# Patient Record
Sex: Male | Born: 1970
Health system: Southern US, Community
[De-identification: ages and names within clinical notes are randomized; demographics above are authoritative.]

## PROBLEM LIST (undated history)

## (undated) DIAGNOSIS — R110 Nausea: Secondary | ICD-10-CM

## (undated) DIAGNOSIS — B20 Human immunodeficiency virus [HIV] disease: Secondary | ICD-10-CM

## (undated) DIAGNOSIS — F329 Major depressive disorder, single episode, unspecified: Secondary | ICD-10-CM

## (undated) DIAGNOSIS — E109 Type 1 diabetes mellitus without complications: Secondary | ICD-10-CM

## (undated) DIAGNOSIS — I10 Essential (primary) hypertension: Secondary | ICD-10-CM

## (undated) HISTORY — PX: CHOLECYSTECTOMY: SHX55

---

## 2007-05-11 HISTORY — PX: OTHER SURGICAL HISTORY: SHX169

## 2017-08-02 ENCOUNTER — Telehealth: Payer: Self-pay

## 2017-08-02 ENCOUNTER — Other Ambulatory Visit: Payer: Self-pay

## 2017-08-02 ENCOUNTER — Ambulatory Visit: Payer: Self-pay

## 2017-08-02 DIAGNOSIS — B20 Human immunodeficiency virus [HIV] disease: Secondary | ICD-10-CM | POA: Insufficient documentation

## 2017-08-02 NOTE — Telephone Encounter (Signed)
[facility] [place]. He will sign medical release today and see financial counselor.   Diagnosis verified via electronic medical records via patient's phone.   Last Cd-4 736 in Jan 2019.    If he qualifies for [employer] we will complete labs today.    Laurell Josephsammy K Elleigh Cassetta, RN

## 2017-08-03 ENCOUNTER — Encounter: Payer: Self-pay | Admitting: Infectious Diseases

## 2017-08-03 LAB — URINALYSIS
Bilirubin Urine: NEGATIVE
Hgb urine dipstick: NEGATIVE
Ketones, ur: NEGATIVE
LEUKOCYTES UA: NEGATIVE
NITRITE: NEGATIVE
PROTEIN: NEGATIVE
SPECIFIC GRAVITY, URINE: 1.041 — AB (ref 1.001–1.03)
pH: 5.5 (ref 5.0–8.0)

## 2017-08-03 LAB — URINE CYTOLOGY ANCILLARY ONLY
Chlamydia: NEGATIVE
Neisseria Gonorrhea: NEGATIVE

## 2017-08-04 LAB — HIV-1 RNA ULTRAQUANT REFLEX TO GENTYP+
HIV 1 RNA Quant: 35 Copies/mL — ABNORMAL HIGH
HIV-1 RNA QUANT, LOG: 1.54 {Log_copies}/mL — AB

## 2017-08-04 LAB — QUANTIFERON-TB GOLD PLUS
Mitogen-NIL: >10 IU/mL
NIL: 0.27 [IU]/mL
QUANTIFERON-TB GOLD PLUS: NEGATIVE
TB1-NIL: <0 IU/mL

## 2017-08-04 LAB — T-HELPER CELL (CD4) - (RCID CLINIC ONLY)
CD4 T CELL ABS: 1010 /uL (ref 400–2700)
CD4 T CELL HELPER: 28 % — AB (ref 33–55)

## 2017-08-05 LAB — CBC WITH DIFFERENTIAL/PLATELET
BASOS PCT: 0.4 %
Basophils Absolute: 28 cells/uL (ref 0–200)
Eosinophils Absolute: 50 cells/uL (ref 15–500)
Eosinophils Relative: 0.7 %
HEMATOCRIT: 51 % — AB (ref 38.5–50.0)
Hemoglobin: 17 g/dL (ref 13.2–17.1)
LYMPHS ABS: 3507 {cells}/uL (ref 850–3900)
MCH: 30.5 pg (ref 27.0–33.0)
MCHC: 33.3 g/dL (ref 32.0–36.0)
MCV: 91.4 fL (ref 80.0–100.0)
MPV: 9.1 fL (ref 7.5–12.5)
Monocytes Relative: 8.3 %
Neutro Abs: 2925 cells/uL (ref 1500–7800)
Neutrophils Relative %: 41.2 %
Platelets: 244 10*3/uL (ref 140–400)
RBC: 5.58 10*6/uL (ref 4.20–5.80)
RDW: 14.3 % (ref 11.0–15.0)
Total Lymphocyte: 49.4 %
WBC: 7.1 10*3/uL (ref 3.8–10.8)
WBCMIX: 589 {cells}/uL (ref 200–950)

## 2017-08-05 LAB — COMPLETE METABOLIC PANEL WITH GFR
AG RATIO: 1.4 (calc) (ref 1.0–2.5)
ALBUMIN MSPROF: 4.8 g/dL (ref 3.6–5.1)
ALT: 73 U/L — ABNORMAL HIGH (ref 9–46)
AST: 52 U/L — ABNORMAL HIGH (ref 10–40)
Alkaline phosphatase (APISO): 98 U/L (ref 40–115)
BUN: 14 mg/dL (ref 7–25)
CALCIUM: 10.2 mg/dL (ref 8.6–10.3)
CO2: 24 mmol/L (ref 20–32)
Chloride: 100 mmol/L (ref 98–110)
Creat: 0.92 mg/dL (ref 0.60–1.35)
GFR, EST AFRICAN AMERICAN: 113 mL/min/{1.73_m2} (ref >=60)
GFR, EST NON AFRICAN AMERICAN: 97 mL/min/{1.73_m2} (ref >=60)
GLOBULIN: 3.5 g/dL (ref 1.9–3.7)
Glucose, Bld: 346 mg/dL — ABNORMAL HIGH (ref 65–99)
POTASSIUM: 4.7 mmol/L (ref 3.5–5.3)
SODIUM: 136 mmol/L (ref 135–146)
TOTAL PROTEIN: 8.3 g/dL — AB (ref 6.1–8.1)
Total Bilirubin: 0.6 mg/dL (ref 0.2–1.2)

## 2017-08-05 LAB — HEPATITIS B SURFACE ANTIGEN: Hepatitis B Surface Ag: NONREACTIVE

## 2017-08-05 LAB — LIPID PANEL
CHOLESTEROL: 111 mg/dL (ref <200)
HDL: 47 mg/dL (ref >40)
LDL CHOLESTEROL (CALC): 44 mg/dL
Non-HDL Cholesterol (Calc): 64 mg/dL (calc) (ref <130)
TRIGLYCERIDES: 118 mg/dL (ref <150)
Total CHOL/HDL Ratio: 2.4 (calc) (ref <5.0)

## 2017-08-05 LAB — HEPATITIS C ANTIBODY
Hepatitis C Ab: NONREACTIVE
SIGNAL TO CUT-OFF: 0.11 (ref <1.00)

## 2017-08-05 LAB — HLA B*5701: HLA-B 5701 W/RFLX HLA-B HIGH: NEGATIVE

## 2017-08-05 LAB — HEPATITIS A ANTIBODY, TOTAL: HEPATITIS A AB,TOTAL: REACTIVE — AB

## 2017-08-05 LAB — HEPATITIS B SURFACE ANTIBODY,QUALITATIVE: Hep B S Ab: REACTIVE — AB

## 2017-08-05 LAB — RPR: RPR: NONREACTIVE

## 2017-08-05 LAB — HEPATITIS B CORE ANTIBODY, TOTAL: Hep B Core Total Ab: REACTIVE — AB

## 2017-08-11 ENCOUNTER — Ambulatory Visit: Payer: Self-pay | Admitting: Infectious Diseases

## 2017-08-18 ENCOUNTER — Ambulatory Visit: Payer: Self-pay | Admitting: Infectious Diseases

## 2017-08-30 DIAGNOSIS — N529 Male erectile dysfunction, unspecified: Secondary | ICD-10-CM | POA: Insufficient documentation

## 2017-08-30 DIAGNOSIS — F32A Depression, unspecified: Secondary | ICD-10-CM | POA: Insufficient documentation

## 2017-08-30 DIAGNOSIS — R0609 Other forms of dyspnea: Secondary | ICD-10-CM | POA: Insufficient documentation

## 2017-09-27 DIAGNOSIS — R748 Abnormal levels of other serum enzymes: Secondary | ICD-10-CM | POA: Insufficient documentation

## 2018-02-27 DIAGNOSIS — R110 Nausea: Secondary | ICD-10-CM | POA: Insufficient documentation

## 2018-04-06 ENCOUNTER — Emergency Department (HOSPITAL_COMMUNITY): Payer: Medicaid - Out of State

## 2018-04-06 ENCOUNTER — Other Ambulatory Visit: Payer: Self-pay

## 2018-04-06 ENCOUNTER — Emergency Department (HOSPITAL_COMMUNITY)
Admission: EM | Admit: 2018-04-06 | Discharge: 2018-04-06 | Disposition: A | Discharge status: Home / Self Care | Payer: Medicaid - Out of State | Attending: Emergency Medicine | Admitting: Emergency Medicine

## 2018-04-06 ENCOUNTER — Encounter (HOSPITAL_COMMUNITY): Payer: Self-pay

## 2018-04-06 DIAGNOSIS — B20 Human immunodeficiency virus [HIV] disease: Secondary | ICD-10-CM | POA: Diagnosis not present

## 2018-04-06 DIAGNOSIS — Z21 Asymptomatic human immunodeficiency virus [HIV] infection status: Secondary | ICD-10-CM

## 2018-04-06 DIAGNOSIS — K6289 Other specified diseases of anus and rectum: Secondary | ICD-10-CM | POA: Insufficient documentation

## 2018-04-06 DIAGNOSIS — R110 Nausea: Secondary | ICD-10-CM

## 2018-04-06 DIAGNOSIS — F32A Depression, unspecified: Secondary | ICD-10-CM

## 2018-04-06 DIAGNOSIS — E109 Type 1 diabetes mellitus without complications: Secondary | ICD-10-CM

## 2018-04-06 HISTORY — DX: Nausea: R11.0

## 2018-04-06 HISTORY — DX: Major depressive disorder, single episode, unspecified: F32.9

## 2018-04-06 HISTORY — DX: Asymptomatic human immunodeficiency virus (hiv) infection status: Z21

## 2018-04-06 HISTORY — DX: Depression, unspecified: F32.A

## 2018-04-06 HISTORY — DX: Type 1 diabetes mellitus without complications: E10.9

## 2018-04-06 HISTORY — DX: Essential (primary) hypertension: I10

## 2018-04-06 HISTORY — DX: Human immunodeficiency virus (HIV) disease: B20

## 2018-04-06 LAB — COMPREHENSIVE METABOLIC PANEL
ALT: 67 U/L — ABNORMAL HIGH (ref 0–44)
AST: 64 U/L — ABNORMAL HIGH (ref 15–41)
Albumin: 4.1 g/dL (ref 3.5–5.0)
Alkaline Phosphatase: 91 U/L (ref 38–126)
Anion gap: 7 (ref 5–15)
BUN: 14 mg/dL (ref 6–20)
CO2: 26 mmol/L (ref 22–32)
Calcium: 9 mg/dL (ref 8.9–10.3)
Chloride: 101 mmol/L (ref 98–111)
Creatinine, Ser: 0.81 mg/dL (ref 0.61–1.24)
GFR calc Af Amer: >60 mL/min (ref >60)
GFR calc non Af Amer: >60 mL/min (ref >60)
Glucose, Bld: 323 mg/dL — ABNORMAL HIGH (ref 70–99)
Potassium: 4.2 mmol/L (ref 3.5–5.1)
Sodium: 134 mmol/L — ABNORMAL LOW (ref 135–145)
Total Bilirubin: 1.2 mg/dL (ref 0.3–1.2)
Total Protein: 7.9 g/dL (ref 6.5–8.1)

## 2018-04-06 LAB — URINALYSIS, ROUTINE W REFLEX MICROSCOPIC
Bacteria, UA: NONE SEEN
Bilirubin Urine: NEGATIVE
Glucose, UA: >=500 mg/dL — AB
Hgb urine dipstick: NEGATIVE
Ketones, ur: 5 mg/dL — AB
Leukocytes, UA: NEGATIVE
NITRITE: NEGATIVE
Protein, ur: NEGATIVE mg/dL
Specific Gravity, Urine: 1.041 — ABNORMAL HIGH (ref 1.005–1.030)
pH: 6 (ref 5.0–8.0)

## 2018-04-06 LAB — CBC
HCT: 46.7 % (ref 39.0–52.0)
Hemoglobin: 15.5 g/dL (ref 13.0–17.0)
MCH: 33.3 pg (ref 26.0–34.0)
MCHC: 33.2 g/dL (ref 30.0–36.0)
MCV: 100.2 fL — ABNORMAL HIGH (ref 80.0–100.0)
Platelets: 191 10*3/uL (ref 150–400)
RBC: 4.66 MIL/uL (ref 4.22–5.81)
RDW: 13.4 % (ref 11.5–15.5)
WBC: 5.6 10*3/uL (ref 4.0–10.5)
nRBC: 0 % (ref 0.0–0.2)

## 2018-04-06 LAB — POC OCCULT BLOOD, ED: Fecal Occult Bld: NEGATIVE

## 2018-04-06 LAB — LIPASE, BLOOD: Lipase: 33 U/L (ref 11–51)

## 2018-04-06 MED ORDER — SODIUM CHLORIDE (PF) 0.9 % IJ SOLN
INTRAMUSCULAR | Status: AC
  Filled 2018-04-06: qty 50

## 2018-04-06 MED ORDER — SODIUM CHLORIDE 0.9 % IV SOLN
INTRAVENOUS | Status: DC
  Administered 2018-04-06: 15:00:00 via INTRAVENOUS

## 2018-04-06 MED ORDER — IOPAMIDOL (ISOVUE-300) INJECTION 61%
INTRAVENOUS | Status: AC
  Filled 2018-04-06: qty 100

## 2018-04-06 MED ORDER — METRONIDAZOLE 500 MG PO TABS
500.0000 mg | ORAL_TABLET | Freq: Once | ORAL | Status: AC
  Administered 2018-04-06: 500 mg via ORAL
  Filled 2018-04-06: qty 1

## 2018-04-06 MED ORDER — IOHEXOL 300 MG/ML  SOLN: 30.0000 mL | Freq: Once | INTRAMUSCULAR | Status: DC | PRN

## 2018-04-06 MED ORDER — CIPROFLOXACIN HCL 500 MG PO TABS
500.0000 mg | ORAL_TABLET | Freq: Once | ORAL | Status: AC
  Administered 2018-04-06: 500 mg via ORAL
  Filled 2018-04-06: qty 1

## 2018-04-06 MED ORDER — FENTANYL CITRATE (PF) 100 MCG/2ML IJ SOLN
50.0000 ug | Freq: Once | INTRAMUSCULAR | Status: AC
  Administered 2018-04-06: 50 ug via INTRAVENOUS
  Filled 2018-04-06: qty 2

## 2018-04-06 MED ORDER — SODIUM CHLORIDE 0.9 % IV BOLUS
500.0000 mL | Freq: Once | INTRAVENOUS | Status: AC
  Administered 2018-04-06: 500 mL via INTRAVENOUS

## 2018-04-06 MED ORDER — ONDANSETRON HCL 4 MG/2ML IJ SOLN
4.0000 mg | Freq: Once | INTRAMUSCULAR | Status: AC
  Administered 2018-04-06: 4 mg via INTRAVENOUS
  Filled 2018-04-06: qty 2

## 2018-04-06 MED ORDER — IOHEXOL 300 MG/ML  SOLN
100.0000 mL | Freq: Once | INTRAMUSCULAR | Status: AC | PRN
  Administered 2018-04-06: 100 mL via INTRAVENOUS

## 2018-04-06 MED ORDER — METRONIDAZOLE 500 MG PO TABS: 500.0000 mg | ORAL_TABLET | Freq: Two times a day (BID) | ORAL | 0 refills | Status: DC

## 2018-04-06 MED ORDER — AZITHROMYCIN 1 G PO PACK
1.0000 g | PACK | Freq: Once | ORAL | Status: AC
  Administered 2018-04-06: 1 g via ORAL
  Filled 2018-04-06: qty 1

## 2018-04-06 MED ORDER — CIPROFLOXACIN HCL 500 MG PO TABS: 500.0000 mg | ORAL_TABLET | Freq: Two times a day (BID) | ORAL | 0 refills | Status: DC

## 2018-04-06 NOTE — Discharge Instructions (Addendum)
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  Avoid having sex until you get checked again.  Take the medicines as directed.  Proctitis can be aggravated by constipation so make sure that you are using a stool softener to improve your stooling.  I recommend that use MiraLAX 2 or 3 times a day until you are having a daily soft stool then gradually decrease the frequency of use.  He will likely need to be on MiraLAX for a couple of months.  When you get home schedule a follow-up appointment with an ID doctor or gastroenterologist, for anosscopy to check for ongoing symptoms of proctitis.  See the doctor of your choice if not better in 2 or 3 days.

## 2018-04-06 NOTE — ED Notes (Signed)
Pt verbalized discharge instructions and follow up care. Ambulatory and O2 sats were 97% when discharged

## 2018-04-06 NOTE — ED Provider Notes (Signed)
[facility]-EMERGENCY DEPT Provider Note   CSN: [id number] Arrival date & time: 04/06/18  1320     History   Chief Complaint Chief Complaint  Patient presents with  . Abdominal Pain  . Rectal Bleeding    HPI Adam Chan is a 47 y.o. male.  HPI   He presents for evaluation of this patient for 1 week, improved after taking a laxative, followed by some rectal bleeding.  He also complains of a discharge from his rectum.  He denies nausea, vomiting, abdominal pain, dizziness or weakness.  He has sex with men, receptive intercourse.  No prior similar problem.  There are no other known modifying factors.  Past Medical History:  Diagnosis Date  . Depression 04/06/2018  . Diabetes type 1, controlled (HCC) 04/06/2018  . HIV (human immunodeficiency virus infection) (HCC) 04/06/2018  . Hypertension   . Nausea in adult 04/06/2018    Patient Active Problem List   Diagnosis Date Noted  . HIV disease (HCC) 08/02/2017    Past Surgical History:  Procedure Laterality Date  . Bilaterla mesh for Hernia Bilateral 2009  . CHOLECYSTECTOMY          Home Medications    Prior to Admission medications   Medication Sig Start Date End Date Taking? Authorizing Provider  albuterol (PROVENTIL HFA;VENTOLIN HFA) 108 (90 Base) MCG/ACT inhaler Inhale 2 puffs into the lungs every 6 (six) hours as needed for wheezing or shortness of breath.   Yes [provider]  bictegravir-emtricitabine-tenofovir AF (BIKTARVY) 50-200-25 MG TABS tablet Take 1 tablet by mouth daily.   Yes [provider]  EPINEPHrine 0.3 mg/0.3 mL IJ SOAJ injection Inject 0.3 mg into the muscle once.   Yes [provider]  escitalopram (LEXAPRO) 20 MG tablet Take 20 mg by mouth daily.   Yes [provider]  glipiZIDE (GLUCOTROL) 10 MG tablet Take 10 mg by mouth 2 (two) times daily before a meal.   Yes [provider]  hydrOXYzine (ATARAX/VISTARIL) 50 MG tablet  Take 50 mg by mouth daily as needed (sleep).   Yes [provider]  lisinopril (PRINIVIL,ZESTRIL) 10 MG tablet Take 10 mg by mouth daily.   Yes [provider]  metFORMIN (GLUCOPHAGE) 1000 MG tablet Take 1,000 mg by mouth 2 (two) times daily with a meal.   Yes [provider]  omeprazole (PRILOSEC) 20 MG capsule Take 20 mg by mouth daily.   Yes [provider]  ondansetron (ZOFRAN) 8 MG tablet Take 8 mg by mouth every 8 (eight) hours as needed for nausea or vomiting.   Yes [provider]  rosuvastatin (CRESTOR) 40 MG tablet Take 40 mg by mouth daily.   Yes [provider]  sildenafil (REVATIO) 20 MG tablet Take 20 mg by mouth 3 (three) times daily.   Yes [provider]  ciprofloxacin (CIPRO) 500 MG tablet Take 1 tablet (500 mg total) by mouth 2 (two) times daily. One po bid x 7 days 04/06/18   Mancel Bale, MD  metroNIDAZOLE (FLAGYL) 500 MG tablet Take 1 tablet (500 mg total) by mouth 2 (two) times daily. One po bid x 7 days 04/06/18   Mancel Bale, MD    Family History No family history on file.  Social History Social History   Tobacco Use  . Smoking status: Current Some Day Smoker    Packs/day: 0.50    Types: Cigarettes  . Smokeless tobacco: Never Used  Substance Use Topics  . Alcohol use: Yes  Comment: Social drinker, here and there  . Drug use: Yes    Types: Marijuana    Comment: Uses for Nausea per patient stated     Allergies   Sulfa antibiotics   Review of Systems Review of Systems  All other systems reviewed and are negative.    Physical Exam Updated Vital Signs BP (!) 130/96 (BP Location: Left Arm)   Pulse 83   Temp 98.4 F (36.9 C) (Oral)   Resp 18   Ht 5\' 7"  (1.702 m)   Wt 83.5 kg   SpO2 96%   BMI 28.82 kg/m   Physical Exam  Constitutional: He is oriented to person, place, and time. He appears well-developed and well-nourished.  HENT:  Head: Normocephalic and atraumatic.  Right  Ear: External ear normal.  Left Ear: External ear normal.  Eyes: Pupils are equal, round, and reactive to light. Conjunctivae and EOM are normal.  Neck: Normal range of motion and phonation normal. Neck supple.  Cardiovascular: Normal rate, regular rhythm and normal heart sounds.  Pulmonary/Chest: Effort normal and breath sounds normal. He exhibits no bony tenderness.  Abdominal: Soft. There is no tenderness.  Genitourinary:  Genitourinary Comments: Normal anus.  No fecal impaction.  No stool in the rectum.  No blood in rectum.  Unable to palpate prostate.  No rectal mass.  Moderate tenderness, more anterior on rectal examination.  Musculoskeletal: Normal range of motion.  Neurological: He is alert and oriented to person, place, and time. No cranial nerve deficit or sensory deficit. He exhibits normal muscle tone. Coordination normal.  Skin: Skin is warm, dry and intact.  Psychiatric: He has a normal mood and affect. His behavior is normal. Judgment and thought content normal.  Nursing note and vitals reviewed.    ED Treatments / Results  Labs (all labs ordered are listed, but only abnormal results are displayed) Labs Reviewed  COMPREHENSIVE METABOLIC PANEL - Abnormal; Notable for the following components:      Result Value   Sodium 134 (*)    Glucose, Bld 323 (*)    AST 64 (*)    ALT 67 (*)    All other components within normal limits  CBC - Abnormal; Notable for the following components:   MCV 100.2 (*)    All other components within normal limits  LIPASE, BLOOD  URINALYSIS, ROUTINE W REFLEX MICROSCOPIC  OCCULT BLOOD X 1 CARD TO LAB, STOOL  POC OCCULT BLOOD, ED    EKG None  Radiology Ct Abdomen Pelvis W Contrast  Result Date: 04/06/2018 CLINICAL DATA:  Rectal bleeding and abdominal pain, no bowel movement for 5 days, history diabetes mellitus type I, hypertension, HIV EXAM: CT ABDOMEN AND PELVIS WITH CONTRAST TECHNIQUE: Multidetector CT imaging of the abdomen and pelvis  was performed using the standard protocol following bolus administration of intravenous contrast. Sagittal and coronal MPR images reconstructed from axial data set. CONTRAST:  OMNIPAQUE IOHEXOL 300 MG/ML SOLN IV. No oral contrast. COMPARISON:  None FINDINGS: Lower chest: Lung bases clear Hepatobiliary: Gallbladder surgically absent. Fatty infiltration of liver. No focal hepatic lesions or biliary dilatation. Pancreas: Normal appearance Spleen: Normal appearance Adrenals/Urinary Tract: Adrenal glands, kidneys, ureters, and bladder normal appearance Stomach/Bowel: Normal appendix. Stomach decompressed. Rectal wall thickening consistent with proctitis, with mild associated infiltrative changes of perirectal fat. Large and small bowel loops otherwise unremarkable. Normal retained stool burden. Vascular/Lymphatic: Minimal atherosclerotic calcification aorta and coronary arteries. Aorta normal caliber. No adenopathy. Reproductive: Unremarkable Other: Small BILATERAL inguinal hernias containing fat  larger on RIGHT. No free air or free fluid. Musculoskeletal: Degenerative disc disease changes L5-S1. IMPRESSION: Rectal wall thickening and infiltration of perirectal fat consistent with proctitis, favor infection. Fatty infiltration of liver. BILATERAL inguinal hernias containing fat. Electronically Signed   By: Ulyses SouthwardMark  Boles M.D.   On: 04/06/2018 15:42    Procedures Procedures (including critical care time)  Medications Ordered in ED Medications  0.9 %  sodium chloride infusion ( Intravenous New Bag/Given 04/06/18 1446)  iohexol (OMNIPAQUE) 300 MG/ML solution 30 mL ( Oral Canceled Entry 04/06/18 1530)  iopamidol (ISOVUE-300) 61 % injection (has no administration in time range)  sodium chloride (PF) 0.9 % injection (has no administration in time range)  azithromycin (ZITHROMAX) powder 1 g (has no administration in time range)  ciprofloxacin (CIPRO) tablet 500 mg (has no administration in time range)    metroNIDAZOLE (FLAGYL) tablet 500 mg (has no administration in time range)  ondansetron (ZOFRAN) injection 4 mg (4 mg Intravenous Given 04/06/18 1446)  fentaNYL (SUBLIMAZE) injection 50 mcg (50 mcg Intravenous Given 04/06/18 1447)  sodium chloride 0.9 % bolus 500 mL (500 mLs Intravenous New Bag/Given 04/06/18 1443)  iohexol (OMNIPAQUE) 300 MG/ML solution 100 mL (100 mLs Intravenous Contrast Given 04/06/18 1530)     Initial Impression / Assessment and Plan / ED Course  I have reviewed the triage vital signs and the nursing notes.  Pertinent labs & imaging results that were available during my care of the patient were reviewed by me and considered in my medical decision making (see chart for details).  Clinical Course as of Apr 06 1633  Thu Apr 06, 2018  1600 Normal  POC occult blood, ED [EW]  1600 Normal except MCV high  CBC(!) [EW]  1600 Normal except sodium low, glucose high  Comprehensive metabolic panel(!) [EW]  1630 Comprehensive metabolic panel(!) [EW]    Clinical Course User Index [EW] Mancel BaleWentz, Marriana Hibberd, MD     Patient Vitals for the past 24 hrs:  BP Temp Temp src Pulse Resp SpO2 Height Weight  04/06/18 1526 - - - - - - - 83.5 kg  04/06/18 1500 (!) 130/96 - - 83 18 96 % - -  04/06/18 1325 (!) 151/113 98.4 F (36.9 C) Oral (!) 116 18 100 % 5\' 7"  (1.702 m) 38.1 kg   4:22 PM Reevaluation with update and discussion. After initial assessment and treatment, an updated evaluation reveals he is fairly comfortable has no further complaints.  Findings discussed and questions answered. Mancel BaleElliott Jovannie Ulibarri    Medical Decision Making: Proctitis, patient with HIV disease, and diabetes.  Patient has sex with men.  He will require STI treatment.  Mild hyperglycemia without evidence for ketosis.  Patient is unable to outpatient management with oral medications.  [place] is visiting [place], plans on returning to [place], this weekend.  No indication for hospitalization at this time.  CRITICAL  CARE-no Performed by: Mancel BaleElliott Chauntae Hults  Nursing Notes Reviewed/ Care Coordinated Applicable Imaging Reviewed Interpretation of Laboratory Data incorporated into ED treatment  The patient appears reasonably screened and/or stabilized for discharge and I doubt any other medical condition or other [facility] requiring further screening, evaluation, or treatment in the ED at this time prior to discharge.  Plan: Home Medications-continue usual medications; Home Treatments-rest, fluids, stool softener; return here if the recommended treatment, does not improve the symptoms; Recommended follow up-PCP, GI and ID follow-up in a week or 2.  Final Clinical Impressions(s) / ED Diagnoses   Final diagnoses:  Proctitis  HIV  disease (HCC)  Type 1 diabetes mellitus without complication [facility])    ED Discharge Orders         Ordered    ciprofloxacin (CIPRO) 500 MG tablet  2 times daily     04/06/18 1633    metroNIDAZOLE (FLAGYL) 500 MG tablet  2 times daily     04/06/18 1633           Mancel Bale, MD 04/06/18 1635

## 2018-04-06 NOTE — ED Notes (Signed)
Pt given urinal for urine sample 

## 2018-04-06 NOTE — ED Notes (Signed)
Urine and culture sent to lab  

## 2018-04-06 NOTE — ED Triage Notes (Signed)
Patient comes from home. Patient is AOx4 and ambulatory. Patient chief complaint is rectal bleeding and abdominal pain due to what patient believes is not having bowel movement in roughly 5 days. Patient took laxative yesterday and had small bowel movement however it was not a lot and abd pain persisted.

## 2018-05-17 ENCOUNTER — Other Ambulatory Visit: Payer: Self-pay | Admitting: *Deleted

## 2018-05-17 DIAGNOSIS — B20 Human immunodeficiency virus [HIV] disease: Secondary | ICD-10-CM

## 2018-05-19 ENCOUNTER — Other Ambulatory Visit: Payer: PRIVATE HEALTH INSURANCE

## 2018-05-19 ENCOUNTER — Ambulatory Visit (INDEPENDENT_AMBULATORY_CARE_PROVIDER_SITE_OTHER): Payer: PRIVATE HEALTH INSURANCE | Admitting: Pharmacist

## 2018-05-19 ENCOUNTER — Ambulatory Visit: Payer: PRIVATE HEALTH INSURANCE

## 2018-05-19 DIAGNOSIS — B20 Human immunodeficiency virus [HIV] disease: Secondary | ICD-10-CM | POA: Diagnosis not present

## 2018-05-19 LAB — T-HELPER CELL (CD4) - (RCID CLINIC ONLY)
CD4 % Helper T Cell: 31 % — ABNORMAL LOW (ref 33–55)
CD4 T Cell Abs: 770 /uL (ref 400–2700)

## 2018-05-19 MED ORDER — BICTEGRAVIR-EMTRICITAB-TENOFOV 50-200-25 MG PO TABS: 1.0000 | ORAL_TABLET | Freq: Every day | ORAL | 1 refills | Status: DC

## 2018-05-19 MED FILL — BIKTARVY 50-200-25 MG TABS: 50-200-25 | 30 days supply | Qty: 30 | Fill #0

## 2018-05-19 NOTE — Progress Notes (Signed)
Patient is here transferring from another clinic.  He will run out of Biktarvy before his ADAP is approved and before his appointment on 1/24.  Kathie Rhodes was able to get him advancing access with Laroy Apple and [facility] will mail his Susanne Borders to him.

## 2018-05-22 LAB — CBC WITH DIFFERENTIAL/PLATELET
Absolute Monocytes: 369 cells/uL (ref 200–950)
Basophils Absolute: 21 cells/uL (ref 0–200)
Basophils Relative: 0.4 %
Eosinophils Absolute: 68 cells/uL (ref 15–500)
Eosinophils Relative: 1.3 %
HCT: 46.9 % (ref 38.5–50.0)
Hemoglobin: 15.4 g/dL (ref 13.2–17.1)
LYMPHS ABS: 2454 {cells}/uL (ref 850–3900)
MCH: 30.9 pg (ref 27.0–33.0)
MCHC: 32.8 g/dL (ref 32.0–36.0)
MCV: 94 fL (ref 80.0–100.0)
MPV: 9.4 fL (ref 7.5–12.5)
Monocytes Relative: 7.1 %
NEUTROS PCT: 44 %
Neutro Abs: 2288 cells/uL (ref 1500–7800)
Platelets: 235 10*3/uL (ref 140–400)
RBC: 4.99 10*6/uL (ref 4.20–5.80)
RDW: 12.6 % (ref 11.0–15.0)
Total Lymphocyte: 47.2 %
WBC: 5.2 10*3/uL (ref 3.8–10.8)

## 2018-05-22 LAB — COMPLETE METABOLIC PANEL WITH GFR
AG Ratio: 1.4 (calc) (ref 1.0–2.5)
ALBUMIN MSPROF: 4.4 g/dL (ref 3.6–5.1)
ALT: 30 U/L (ref 9–46)
AST: 27 U/L (ref 10–35)
Alkaline phosphatase (APISO): 94 U/L (ref 40–115)
BILIRUBIN TOTAL: 0.5 mg/dL (ref 0.2–1.2)
BUN: 15 mg/dL (ref 7–25)
CHLORIDE: 99 mmol/L (ref 98–110)
CO2: 26 mmol/L (ref 20–32)
Calcium: 10 mg/dL (ref 8.6–10.3)
Creat: 1.02 mg/dL (ref 0.70–1.33)
GFR, Est African American: 99 mL/min/{1.73_m2} (ref >=60)
GFR, Est Non African American: 85 mL/min/{1.73_m2} (ref >=60)
Globulin: 3.1 g/dL (calc) (ref 1.9–3.7)
Glucose, Bld: 449 mg/dL — ABNORMAL HIGH (ref 65–99)
Potassium: 5.6 mmol/L — ABNORMAL HIGH (ref 3.5–5.3)
Sodium: 133 mmol/L — ABNORMAL LOW (ref 135–146)
TOTAL PROTEIN: 7.5 g/dL (ref 6.1–8.1)

## 2018-05-22 LAB — HIV-1 RNA QUANT-NO REFLEX-BLD
HIV 1 RNA Quant: 145 copies/mL — ABNORMAL HIGH
HIV-1 RNA QUANT, LOG: 2.16 {Log_copies}/mL — AB

## 2018-05-23 ENCOUNTER — Other Ambulatory Visit: Payer: Self-pay | Admitting: Behavioral Health

## 2018-05-23 ENCOUNTER — Telehealth: Payer: Self-pay | Admitting: *Deleted

## 2018-05-23 ENCOUNTER — Telehealth: Payer: Self-pay

## 2018-05-23 DIAGNOSIS — E1165 Type 2 diabetes mellitus with hyperglycemia: Secondary | ICD-10-CM

## 2018-05-23 NOTE — Telephone Encounter (Signed)
Called [facility] to discuss results. Patient didn't answer, will try again later today 05/23/2018.   Gerarda Fraction, [place]

## 2018-05-23 NOTE — Telephone Encounter (Signed)
-----   Message from Stephanie N Dixon, NP sent at 05/23/2018  8:37 AM EST ----- Patient with known diabetes but blood sugars very high (449) - Please call Adam Chan to make certain he has diabetes medications and care provider here locally to help manage (please place referral for internal medicine vs endocrinology if no one available). If he is feeling sick would have him go to ER for fluids and care to bring his sugars back down. 

## 2018-05-23 NOTE — Telephone Encounter (Signed)
-----   Message from Blanchard Kelch, NP sent at 05/23/2018  8:37 AM EST ----- Patient with known diabetes but blood sugars very high (449) - Please call Adam Chan to make certain he has diabetes medications and care provider here locally to help manage (please place referral for internal medicine vs endocrinology if no one available). If he is feeling sick would have him go to ER for fluids and care to bring his sugars back down.

## 2018-05-23 NOTE — Telephone Encounter (Signed)
Error

## 2018-05-23 NOTE — Telephone Encounter (Signed)
Adam Chan returned call informed him per Adam Chan that his blood sugars were high, 449. Patient informed me that he just relocated to the area and will need a primary care provider I informed him that a referral will be sent to internal medicine. Adam Chan stated that he has been taking the maximum dose of Trulicity along with Metformin.   I sent Adam Chan a text message to set up his MyChart.   Adam Chan, [place]

## 2018-05-23 NOTE — Telephone Encounter (Signed)
[facility] called regarding labs drawn 1/10, glucose of 449.  Lawanna Kobus reached out to the patient per [name], has referred the patient to [facility] for primary care. He is new to the area, is in the process of establishing care.  Andree Coss, RN

## 2018-06-02 ENCOUNTER — Telehealth: Payer: Self-pay | Admitting: Pharmacy Technician

## 2018-06-02 ENCOUNTER — Ambulatory Visit: Payer: Self-pay | Admitting: Pharmacist

## 2018-06-02 ENCOUNTER — Ambulatory Visit: Payer: Self-pay | Admitting: Family

## 2018-06-02 ENCOUNTER — Encounter: Payer: Self-pay | Admitting: Family

## 2018-06-02 VITALS — BP 117/81 | HR 92 | Ht 67.0 in | Wt 196.0 lb

## 2018-06-02 DIAGNOSIS — Z7984 Long term (current) use of oral hypoglycemic drugs: Secondary | ICD-10-CM

## 2018-06-02 DIAGNOSIS — Z0000 Encounter for general adult medical examination without abnormal findings: Secondary | ICD-10-CM

## 2018-06-02 DIAGNOSIS — F3342 Major depressive disorder, recurrent, in full remission: Secondary | ICD-10-CM

## 2018-06-02 DIAGNOSIS — B20 Human immunodeficiency virus [HIV] disease: Secondary | ICD-10-CM

## 2018-06-02 DIAGNOSIS — Z79899 Other long term (current) drug therapy: Secondary | ICD-10-CM

## 2018-06-02 DIAGNOSIS — E1165 Type 2 diabetes mellitus with hyperglycemia: Secondary | ICD-10-CM | POA: Insufficient documentation

## 2018-06-02 DIAGNOSIS — E118 Type 2 diabetes mellitus with unspecified complications: Secondary | ICD-10-CM

## 2018-06-02 DIAGNOSIS — I10 Essential (primary) hypertension: Secondary | ICD-10-CM

## 2018-06-02 MED ORDER — DULAGLUTIDE 1.5 MG/0.5ML ~~LOC~~ SOAJ: SUBCUTANEOUS | 2 refills | Status: DC

## 2018-06-02 MED ORDER — ONDANSETRON HCL 8 MG PO TABS: 8.0000 mg | ORAL_TABLET | Freq: Three times a day (TID) | ORAL | 1 refills | Status: DC | PRN

## 2018-06-02 MED ORDER — LISINOPRIL 10 MG PO TABS: 10.0000 mg | ORAL_TABLET | Freq: Every day | ORAL | 1 refills | Status: DC

## 2018-06-02 MED ORDER — GLIPIZIDE 10 MG PO TABS: 10.0000 mg | ORAL_TABLET | Freq: Two times a day (BID) | ORAL | 1 refills | Status: DC

## 2018-06-02 MED ORDER — ESCITALOPRAM OXALATE 20 MG PO TABS: 20.0000 mg | ORAL_TABLET | Freq: Every day | ORAL | 1 refills | Status: DC

## 2018-06-02 MED ORDER — ROSUVASTATIN CALCIUM 40 MG PO TABS: 40.0000 mg | ORAL_TABLET | Freq: Every day | ORAL | 1 refills | Status: DC

## 2018-06-02 MED ORDER — BICTEGRAVIR-EMTRICITAB-TENOFOV 50-200-25 MG PO TABS: 1.0000 | ORAL_TABLET | Freq: Every day | ORAL | 3 refills | Status: DC

## 2018-06-02 MED ORDER — METFORMIN HCL 1000 MG PO TABS: 1000.0000 mg | ORAL_TABLET | Freq: Two times a day (BID) | ORAL | 1 refills | Status: DC

## 2018-06-02 NOTE — Addendum Note (Signed)
Addended by: Jeanine Luz D on: 06/02/2018 02:17 PM   Modules accepted: Orders

## 2018-06-02 NOTE — Telephone Encounter (Signed)
Med assistance application for Trulicity is submitted.  [place] [phone]

## 2018-06-02 NOTE — Assessment & Plan Note (Signed)
Blood pressure appears adequately controlled with current lisinopril with no adverse side effects. Encouraged to monitor blood pressure at home as able. Continue current dose of lisinopril.

## 2018-06-02 NOTE — Progress Notes (Addendum)
Subjective:    Patient ID: Adam Chan, male    DOB: 1970-09-27, 48 y.o.   MRN: [id number]  Chief Complaint  Patient presents with  . HIV Positive/AIDS  . Depression  . Diabetes  . Hypertension    HPI:  Adam Chan is a 48 y.o. male who presents today for initial office visit to establish/transfer care for HIV disease.   1.) HIV disease - [name] was initially diagnosed with HIV-1 around September of 2018 and was being tested because he was very sick at the time. Risk factors for acquiring HIV include bisexual contact. Following diagnosis he was started on Genvoya which caused him severe nausea. He was switched to [facility] shortly after and has been on it ever since with no adverse side effects. 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 He has been approved for UMAP/ADAP and will be receiving his medications from [place].   He is a social drinker and does use marijuana to help with a chronic nausea problem which he does as needed. He is not currently sexually active but does use condoms. Current some day smoker and primarily in social situations or stress. Denies recreational or illicit drug use. Unemployed and seeking employment.  Denies fevers, chills, night sweats, headaches, changes in vision, neck pain/stiffness, nausea, diarrhea, vomiting, lesions or rashes.  2.) Diabetes - Most recently prescribed Metformin, Trulicity, and Glipizide. Has been taking the Trulicity and Glipizide as prescribed and taking the Metformin once daily. Most recent A1c from previous PCP records is 8.1. No excessive hunger, thirst, urination, changes in vision, or numbness/tingling. He has not had an eye exam recently and is due for dental screening. Most recently blood work completed with HIV blood work showed random glucose of 469.   3.) Depression - Previously diagnosed with depression and prescribed Lexapro which has "truly saved his life."  Continues to take the Lexapro as prescribed with no adverse side effects. Denies suicidal or homicidal ideations. Concerned about running out of medication.   4.) Hypertension - Previously diagnosed with hypertension and currently prescribed lisinopril which he is taking as prescribed and denies adverse side effects or cough. Does not currently check blood pressure at home. Denies changes in vision, headaches, shortness of breath or chest pain.   BP Readings from Last 3 Encounters:  06/02/18 117/81  04/06/18 (!) 129/92    Allergies  Allergen Reactions  . Sulfa Antibiotics Anaphylaxis      Outpatient Medications Prior to Visit  Medication Sig Dispense Refill  . albuterol (PROVENTIL HFA;VENTOLIN HFA) 108 (90 Base) MCG/ACT inhaler Inhale 2 puffs into the lungs every 6 (six) hours as needed for wheezing or shortness of breath.    . EPINEPHrine 0.3 mg/0.3 mL IJ SOAJ injection Inject 0.3 mg into the muscle once.    . hydrOXYzine (ATARAX/VISTARIL) 50 MG tablet Take 50 mg by mouth daily as needed (sleep).    Marland Kitchen omeprazole (PRILOSEC) 20 MG capsule Take 20 mg by mouth daily.    . bictegravir-emtricitabine-tenofovir AF (BIKTARVY) 50-200-25 MG TABS tablet Take 1 tablet by mouth daily. 30 tablet 1  . escitalopram (LEXAPRO) 20 MG tablet Take 20 mg by mouth daily.    Marland Kitchen glipiZIDE (GLUCOTROL) 10 MG tablet Take 10 mg by mouth 2 (two) times daily before a meal.    . lisinopril (PRINIVIL,ZESTRIL) 10 MG tablet Take 10 mg by mouth daily.    . metFORMIN (GLUCOPHAGE) 1000 MG tablet Take 1,000 mg by mouth 2 (two)  times daily with a meal.    . ondansetron (ZOFRAN) 8 MG tablet Take 8 mg by mouth every 8 (eight) hours as needed for nausea or vomiting.    . rosuvastatin (CRESTOR) 40 MG tablet Take 40 mg by mouth daily.    . sildenafil (REVATIO) 20 MG tablet Take 20 mg by mouth 3 (three) times daily.    . ciprofloxacin (CIPRO) 500 MG tablet Take 1 tablet (500 mg total) by mouth 2 (two) times daily. One po bid x 7  days (Patient not taking: Reported on 06/02/2018) 14 tablet 0  . metroNIDAZOLE (FLAGYL) 500 MG tablet Take 1 tablet (500 mg total) by mouth 2 (two) times daily. One po bid x 7 days (Patient not taking: Reported on 06/02/2018) 14 tablet 0   No facility-administered medications prior to visit.      Past Medical History:  Diagnosis Date  . Depression 04/06/2018  . Diabetes type 1, controlled ([place]) 04/06/2018  . HIV (human immunodeficiency virus infection) ([place]) 04/06/2018  . Hypertension   . Nausea in adult 04/06/2018      Past Surgical History:  Procedure Laterality Date  . Bilaterla mesh for Hernia Bilateral 2009  . CHOLECYSTECTOMY        Family History  Problem Relation Age of Onset  . Diabetes Mother   . Hypertension Mother   . Heart disease Mother   . Hyperlipidemia Mother   . Diabetes Father   . Hypertension Father   . Hyperlipidemia Father       Social History   Socioeconomic History  . Marital status: Single    Spouse name: Not on file  . Number of children: Not on file  . Years of education: 8  . Highest education level: Not on file  Occupational History  . Occupation: Unemployed  Social Needs  . Financial resource strain: Not hard at all  . Food insecurity:    Worry: Never true    Inability: Never true  . Transportation needs:    Medical: No    Non-medical: No  Tobacco Use  . Smoking status: Current Some Day Smoker    Packs/day: 0.50    Types: Cigarettes  . Smokeless tobacco: Never Used  Substance and Sexual Activity  . Alcohol use: Yes    Comment: Social drinker, here and there  . Drug use: Yes    Types: Marijuana    Comment: Uses for Nausea per patient stated  . Sexual activity: Not Currently    Birth control/protection: Condom    Comment: condoms declined  Lifestyle  . Physical activity:    Days per week: Not on file    Minutes per session: Not on file  . Stress: Not on file  Relationships  . Social connections:    Talks on phone:  Not on file    Gets together: Not on file    Attends religious service: Not on file    Active member of club or organization: Not on file    Attends meetings of clubs or organizations: Not on file    Relationship status: Not on file  . Intimate partner violence:    Fear of current or ex partner: Not on file    Emotionally abused: Not on file    Physically abused: Not on file    Forced sexual activity: Not on file  Other Topics Concern  . Not on file  Social History Narrative  . Not on file      Review of Systems  Constitutional: Negative for appetite change, chills, fatigue, fever and unexpected weight change.  HENT: Negative.   Eyes: Negative for visual disturbance.       Negative for changes in vision  Respiratory: Negative for cough, chest tightness, shortness of breath and wheezing.   Cardiovascular: Negative for chest pain, palpitations and leg swelling.  Gastrointestinal: Negative for abdominal pain, constipation, diarrhea, nausea and vomiting.  Endocrine: Negative for polydipsia, polyphagia and polyuria.  Genitourinary: Negative for dysuria, flank pain, frequency, genital sores, hematuria and urgency.  Musculoskeletal: Negative.   Skin: Negative for rash.  Allergic/Immunologic: Negative for immunocompromised state.  Neurological: Negative for dizziness, weakness, light-headedness and headaches.  Hematological: Negative.   Psychiatric/Behavioral: Negative for decreased concentration, dysphoric mood, hallucinations, self-injury and suicidal ideas. The patient is not nervous/anxious.        Objective:    BP 117/81   Pulse 92   Ht '5\' 7"'$  (1.702 m)   Wt 196 lb (88.9 kg)   BMI 30.70 kg/m  Nursing note and vital signs reviewed.  Physical Exam Constitutional:      General: He is not in acute distress.    Appearance: Normal appearance. He is well-developed. He is not ill-appearing.  Eyes:     Conjunctiva/sclera: Conjunctivae normal.  Neck:     Musculoskeletal: Neck  supple.  Cardiovascular:     Rate and Rhythm: Normal rate and regular rhythm.     Heart sounds: Normal heart sounds. No murmur. No friction rub. No gallop.   Pulmonary:     Effort: Pulmonary effort is normal. No respiratory distress.     Breath sounds: Normal breath sounds. No wheezing or rales.  Chest:     Chest wall: No tenderness.  Abdominal:     General: Bowel sounds are normal.     Palpations: Abdomen is soft.     Tenderness: There is no abdominal tenderness.  Lymphadenopathy:     Cervical: No cervical adenopathy.  Skin:    General: Skin is warm and dry.     Findings: No rash.  Neurological:     Mental Status: He is alert and oriented to person, place, and time.  Psychiatric:        Behavior: Behavior normal.        Thought Content: Thought content normal.        Judgment: Judgment normal.         Assessment & Plan:   Problem List Items Addressed This Visit      Cardiovascular and Mediastinum   Essential hypertension    Blood pressure appears adequately controlled with current lisinopril with no adverse side effects. Encouraged to monitor blood pressure at home as able. Continue current dose of lisinopril.       Relevant Medications   lisinopril (PRINIVIL,ZESTRIL) 10 MG tablet   rosuvastatin (CRESTOR) 40 MG tablet     Endocrine   Type 2 diabetes mellitus with hyperglycemia, without long-term current use of insulin (HCC)    Blood sugars remain labile as evidenced by most recent blood work and random blood sugar of 469. Will check A1c today to determine current status. He has no signs/symptoms end organ damage at present. Discussed importance of lowering carbohydrates and processed/sugary food intake to help to control his blood sugars. Continue current dose of Glipizide. Encouraged to take metformin twice daily as prescribed for optimal coverage. Will work on prior authorization to obtain Trulicity as it is not covered on the ADAP program. Encouraged to establish  diabetes care with internal medicine  given his multiple co-morbidities. Follow up in 6 weeks or sooner if needed. Will also consider diabetes education.       Relevant Medications   glipiZIDE (GLUCOTROL) 10 MG tablet   lisinopril (PRINIVIL,ZESTRIL) 10 MG tablet   metFORMIN (GLUCOPHAGE) 1000 MG tablet   rosuvastatin (CRESTOR) 40 MG tablet   Other Relevant Orders   HgB A1c   Comp Met (CMET)     Other   Human immunodeficiency virus (HIV) disease ([place]) - Primary    Well controlled HIV disease with good adherence and tolerance of Biktarvy. He does have a small viral load of 145 and I will recheck that today. No signs/symptoms of opportunistic infection or progressive HIV disease. Covered through UMAP/ADAP for medication. Discussed clinic resources available for use. Continue current dose of Biktarvy. Office follow up in 6 weeks or sooner if needed with lab work 1-2 weeks prior to appointment.       Relevant Medications   bictegravir-emtricitabine-tenofovir AF (BIKTARVY) 50-200-25 MG TABS tablet   ondansetron (ZOFRAN) 8 MG tablet   Other Relevant Orders   HIV 1 RNA quant-no reflex-bld   Comp Met (CMET)   Recurrent major depressive disorder, in full remission ([place])    Stable with current dose of Lexapro and without adverse side effects. No suicidal ideations or signs of psychosis. Self-reported mood is stable. PHQ score of 0. Continue current dose of Lexapro.       Relevant Medications   escitalopram (LEXAPRO) 20 MG tablet   Healthcare maintenance     Declines immunizations today.  Discussed importance of safe sex practices to reduce risk of acquisition and transmission of STI.  Will be due for colonoscopy.  Referral placed to [facility] for routine dental care.           I have discontinued Bach Roes's sildenafil, ciprofloxacin, and metroNIDAZOLE. I have also changed his escitalopram, glipiZIDE, lisinopril, metFORMIN, ondansetron, and rosuvastatin. Additionally, I  am having him maintain his omeprazole, hydrOXYzine, albuterol, EPINEPHrine, and bictegravir-emtricitabine-tenofovir AF.   Meds ordered this encounter  Medications  . bictegravir-emtricitabine-tenofovir AF (BIKTARVY) 50-200-25 MG TABS tablet    Sig: Take 1 tablet by mouth daily.    Dispense:  30 tablet    Refill:  3    Order Specific Question:   Supervising Provider    Answer:   Carlyle Basques [[id number]]  . escitalopram (LEXAPRO) 20 MG tablet    Sig: Take 1 tablet (20 mg total) by mouth daily.    Dispense:  30 tablet    Refill:  1    Order Specific Question:   Supervising Provider    Answer:   Carlyle Basques [[id number]]  . glipiZIDE (GLUCOTROL) 10 MG tablet    Sig: Take 1 tablet (10 mg total) by mouth 2 (two) times daily before a meal.    Dispense:  30 tablet    Refill:  1    Order Specific Question:   Supervising Provider    Answer:   Carlyle Basques [[id number]]  . lisinopril (PRINIVIL,ZESTRIL) 10 MG tablet    Sig: Take 1 tablet (10 mg total) by mouth daily.    Dispense:  30 tablet    Refill:  1    Order Specific Question:   Supervising Provider    Answer:   Carlyle Basques [[id number]]  . metFORMIN (GLUCOPHAGE) 1000 MG tablet    Sig: Take 1 tablet (1,000 mg total) by mouth 2 (two) times daily with a meal.    Dispense:  60 tablet  Refill:  1    Order Specific Question:   Supervising Provider    Answer:   Carlyle Basques [[id number]]  . ondansetron (ZOFRAN) 8 MG tablet    Sig: Take 1 tablet (8 mg total) by mouth every 8 (eight) hours as needed for nausea or vomiting.    Dispense:  20 tablet    Refill:  1    Order Specific Question:   Supervising Provider    Answer:   Carlyle Basques [[id number]]  . rosuvastatin (CRESTOR) 40 MG tablet    Sig: Take 1 tablet (40 mg total) by mouth daily.    Dispense:  30 tablet    Refill:  1    Order Specific Question:   Supervising Provider    Answer:   Carlyle Basques [[id number]]     Follow-up: Return in about 6 weeks (around 07/14/2018), or if symptoms worsen or fail  to improve.    Terri Piedra, MSN, FNP-C Nurse Practitioner [facility] for Infectious Disease [facility] Office phone: [phone] Pager: [place] number: [phone]

## 2018-06-02 NOTE — Assessment & Plan Note (Signed)
Blood sugars remain labile as evidenced by most recent blood work and random blood sugar of 469. Will check A1c today to determine current status. He has no signs/symptoms end organ damage at present. Discussed importance of lowering carbohydrates and processed/sugary food intake to help to control his blood sugars. Continue current dose of Glipizide. Encouraged to take metformin twice daily as prescribed for optimal coverage. Will work on prior authorization to obtain Trulicity as it is not covered on the ADAP program. Encouraged to establish diabetes care with internal medicine given his multiple co-morbidities. Follow up in 6 weeks or sooner if needed. Will also consider diabetes education.

## 2018-06-02 NOTE — Assessment & Plan Note (Signed)
   Declines immunizations today.  Discussed importance of safe sex practices to reduce risk of acquisition and transmission of STI.  Will be due for colonoscopy.  Referral placed to [facility] dental clinic for routine dental care.

## 2018-06-02 NOTE — Assessment & Plan Note (Signed)
Stable with current dose of Lexapro and without adverse side effects. No suicidal ideations or signs of psychosis. Self-reported mood is stable. PHQ score of 0. Continue current dose of Lexapro.

## 2018-06-02 NOTE — Progress Notes (Signed)
Patient doing well. I gave him my card and told him to call me with any issues.

## 2018-06-02 NOTE — Patient Instructions (Addendum)
Nice to meet you.  We will check your blood work today for your A1c.   Recommend following up and establishing with Primary Care.  Continue to take your [place] as prescribed daily.  Plan for follow up in 6 weeks or sooner if needed with lab work 1-2 weeks prior to his appointment.

## 2018-06-02 NOTE — Telephone Encounter (Signed)
Awesome. Thank you! Electronic signature done.

## 2018-06-02 NOTE — Assessment & Plan Note (Signed)
Well controlled HIV disease with good adherence and tolerance of Biktarvy. He does have a small viral load of 145 and I will recheck that today. No signs/symptoms of opportunistic infection or progressive HIV disease. Covered through UMAP/ADAP for medication. Discussed clinic resources available for use. Continue current dose of Biktarvy. Office follow up in 6 weeks or sooner if needed with lab work 1-2 weeks prior to appointment.

## 2018-06-06 ENCOUNTER — Telehealth: Payer: Self-pay | Admitting: Pharmacy Technician

## 2018-06-06 LAB — COMPREHENSIVE METABOLIC PANEL
AG Ratio: 1.4 (calc) (ref 1.0–2.5)
ALBUMIN MSPROF: 4.5 g/dL (ref 3.6–5.1)
ALT: 41 U/L (ref 9–46)
AST: 46 U/L — ABNORMAL HIGH (ref 10–35)
Alkaline phosphatase (APISO): 83 U/L (ref 40–115)
BUN: 14 mg/dL (ref 7–25)
CHLORIDE: 102 mmol/L (ref 98–110)
CO2: 28 mmol/L (ref 20–32)
Calcium: 9.5 mg/dL (ref 8.6–10.3)
Creat: 0.85 mg/dL (ref 0.70–1.33)
GLOBULIN: 3.2 g/dL (ref 1.9–3.7)
Glucose, Bld: 207 mg/dL — ABNORMAL HIGH (ref 65–99)
Potassium: 4.8 mmol/L (ref 3.5–5.3)
Sodium: 138 mmol/L (ref 135–146)
Total Bilirubin: 1.1 mg/dL (ref 0.2–1.2)
Total Protein: 7.7 g/dL (ref 6.1–8.1)

## 2018-06-06 LAB — HIV-1 RNA QUANT-NO REFLEX-BLD
HIV 1 RNA QUANT: 57 {copies}/mL — AB
HIV-1 RNA QUANT, LOG: 1.76 {Log_copies}/mL — AB

## 2018-06-06 LAB — HEMOGLOBIN A1C
Hgb A1c MFr Bld: 8.7 % of total Hgb — ABNORMAL HIGH (ref <5.7)
Mean Plasma Glucose: 203 (calc)
eAG (mmol/L): 11.2 (calc)

## 2018-06-06 NOTE — Telephone Encounter (Signed)
[phone]  Pharmacy Address and Hours   Address Hours  [address] [place] [address]    He called asking about his medications and he thought they would be mailed to his home.  I gave him the phone number to the pharmacy above to call and ask them.  He said that he would.

## 2018-06-07 ENCOUNTER — Other Ambulatory Visit: Payer: Self-pay | Admitting: Pharmacist

## 2018-06-07 ENCOUNTER — Telehealth: Payer: Self-pay | Admitting: Pharmacy Technician

## 2018-06-07 DIAGNOSIS — K219 Gastro-esophageal reflux disease without esophagitis: Secondary | ICD-10-CM

## 2018-06-07 DIAGNOSIS — B20 Human immunodeficiency virus [HIV] disease: Secondary | ICD-10-CM

## 2018-06-07 MED ORDER — OMEPRAZOLE 20 MG PO CPDR: 20.0000 mg | DELAYED_RELEASE_CAPSULE | Freq: Every day | ORAL | 5 refills | Status: DC

## 2018-06-07 NOTE — Telephone Encounter (Signed)
He asked for Omeprazole to be transferred to [facility].  Cassie is transferring.

## 2018-06-12 ENCOUNTER — Encounter: Payer: Self-pay | Admitting: Infectious Diseases

## 2018-06-14 NOTE — Telephone Encounter (Signed)
[facility] Patient Advocate Encounter  Completed manufacturer application for Trulicity in an effort to reduce patient's out of pocket expense.    Application approved for 12 months of medication.   [employer] patient assistance phone number for follow up is [phone].    Netty Starring. Dimas Aguas CPhT Specialty Pharmacy Patient [facility] for Infectious Disease Phone: [phone] Fax:  [phone]

## 2018-06-14 NOTE — Telephone Encounter (Signed)
Awesome! Thanks

## 2018-06-22 ENCOUNTER — Telehealth: Payer: Self-pay | Admitting: Pharmacy Technician

## 2018-06-22 NOTE — Telephone Encounter (Signed)
Awesome! Thank you!

## 2018-06-22 NOTE — Telephone Encounter (Signed)
I spoke with Adam Chan to let him know that 5 months of Trulicity was sent to the clinic for him to pick up.  It was mailed from [employer] patient assistance pharmacy.    He is going out of town and will pick it up Monday 06/26/2018.  It is located at the bottom of the [facility] clinic refrigerator in a box.  Netty Starring. Dimas Aguas CPhT Specialty Pharmacy Patient [facility] for Infectious Disease Phone: [phone] Fax:  [phone]

## 2018-07-12 ENCOUNTER — Other Ambulatory Visit: Payer: PRIVATE HEALTH INSURANCE

## 2018-07-12 ENCOUNTER — Other Ambulatory Visit: Payer: Self-pay | Admitting: *Deleted

## 2018-07-12 DIAGNOSIS — B20 Human immunodeficiency virus [HIV] disease: Secondary | ICD-10-CM

## 2018-07-12 DIAGNOSIS — Z113 Encounter for screening for infections with a predominantly sexual mode of transmission: Secondary | ICD-10-CM

## 2018-07-12 DIAGNOSIS — Z79899 Other long term (current) drug therapy: Secondary | ICD-10-CM

## 2018-07-18 ENCOUNTER — Other Ambulatory Visit: Payer: Self-pay

## 2018-07-18 DIAGNOSIS — Z113 Encounter for screening for infections with a predominantly sexual mode of transmission: Secondary | ICD-10-CM

## 2018-07-18 DIAGNOSIS — Z79899 Other long term (current) drug therapy: Secondary | ICD-10-CM

## 2018-07-18 DIAGNOSIS — B20 Human immunodeficiency virus [HIV] disease: Secondary | ICD-10-CM

## 2018-07-19 LAB — T-HELPER CELL (CD4) - (RCID CLINIC ONLY)
CD4 T CELL HELPER: 37 % (ref 33–55)
CD4 T Cell Abs: 660 /uL (ref 400–2700)

## 2018-07-21 LAB — COMPLETE METABOLIC PANEL WITH GFR
AG Ratio: 1.4 (calc) (ref 1.0–2.5)
ALT: 41 U/L (ref 9–46)
AST: 41 U/L — ABNORMAL HIGH (ref 10–35)
Albumin: 4.1 g/dL (ref 3.6–5.1)
Alkaline phosphatase (APISO): 74 U/L (ref 35–144)
BUN: 13 mg/dL (ref 7–25)
CO2: 27 mmol/L (ref 20–32)
CREATININE: 0.87 mg/dL (ref 0.70–1.33)
Calcium: 9.4 mg/dL (ref 8.6–10.3)
Chloride: 101 mmol/L (ref 98–110)
GFR, Est African American: 117 mL/min/{1.73_m2} (ref >=60)
GFR, Est Non African American: 101 mL/min/{1.73_m2} (ref >=60)
Globulin: 2.9 g/dL (calc) (ref 1.9–3.7)
Glucose, Bld: 219 mg/dL — ABNORMAL HIGH (ref 65–99)
Potassium: 5.2 mmol/L (ref 3.5–5.3)
Sodium: 136 mmol/L (ref 135–146)
Total Bilirubin: 0.5 mg/dL (ref 0.2–1.2)
Total Protein: 7 g/dL (ref 6.1–8.1)

## 2018-07-21 LAB — LIPID PANEL
Cholesterol: 180 mg/dL (ref <200)
HDL: 53 mg/dL (ref >=40)
LDL Cholesterol (Calc): 108 mg/dL (calc) — ABNORMAL HIGH
NON-HDL CHOLESTEROL (CALC): 127 mg/dL (ref <130)
Total CHOL/HDL Ratio: 3.4 (calc) (ref <5.0)
Triglycerides: 99 mg/dL (ref <150)

## 2018-07-21 LAB — CBC WITH DIFFERENTIAL/PLATELET
Absolute Monocytes: 407 cells/uL (ref 200–950)
Basophils Absolute: 18 cells/uL (ref 0–200)
Basophils Relative: 0.3 %
Eosinophils Absolute: 159 cells/uL (ref 15–500)
Eosinophils Relative: 2.7 %
HCT: 43.4 % (ref 38.5–50.0)
Hemoglobin: 14.4 g/dL (ref 13.2–17.1)
Lymphs Abs: 1853 cells/uL (ref 850–3900)
MCH: 28.9 pg (ref 27.0–33.0)
MCHC: 33.2 g/dL (ref 32.0–36.0)
MCV: 87.1 fL (ref 80.0–100.0)
MPV: 8.9 fL (ref 7.5–12.5)
Monocytes Relative: 6.9 %
NEUTROS PCT: 58.7 %
Neutro Abs: 3463 cells/uL (ref 1500–7800)
Platelets: 268 10*3/uL (ref 140–400)
RBC: 4.98 10*6/uL (ref 4.20–5.80)
RDW: 14.8 % (ref 11.0–15.0)
Total Lymphocyte: 31.4 %
WBC: 5.9 10*3/uL (ref 3.8–10.8)

## 2018-07-21 LAB — HIV-1 RNA QUANT-NO REFLEX-BLD
HIV 1 RNA QUANT: 46 {copies}/mL — AB
HIV-1 RNA QUANT, LOG: 1.66 {Log_copies}/mL — AB

## 2018-07-21 LAB — RPR: RPR Ser Ql: NONREACTIVE

## 2018-07-24 ENCOUNTER — Ambulatory Visit: Payer: PRIVATE HEALTH INSURANCE | Admitting: Family

## 2018-07-24 NOTE — Progress Notes (Deleted)
Subjective:    Patient ID: Adam Chan, male    DOB: Mar 16, 1971, 48 y.o.   MRN: [id number]  No chief complaint on file.    HPI:  Adam Chan is a 48 y.o. male who presents today for routine follow-up of HIV disease.  Mr. Okelley was last seen in the office on 06/02/2018 to establish care for HIV disease with good adherence and tolerance to his ART regimen of Biktarvy.  Most recent genotype with K103N mutation.  Most recent blood work completed on 07/18/2018 with a viral load of 57 and CD4 count of 770.  Kidney function, liver function, electrolytes were within normal ranges.  Blood sugars with A1c of 8.7.  Health maintenance due includes second dose of meningococcal vaccination and Pneumovax.   Allergies  Allergen Reactions  . Sulfa Antibiotics Anaphylaxis      Outpatient Medications Prior to Visit  Medication Sig Dispense Refill  . albuterol (PROVENTIL HFA;VENTOLIN HFA) 108 (90 Base) MCG/ACT inhaler Inhale 2 puffs into the lungs every 6 (six) hours as needed for wheezing or shortness of breath.    . bictegravir-emtricitabine-tenofovir AF (BIKTARVY) 50-200-25 MG TABS tablet Take 1 tablet by mouth daily. 30 tablet 3  . Dulaglutide (TRULICITY) 1.5 MG/0.5ML SOPN Inject 0.5 ml subcutaneously weekly. 2 mL 2  . EPINEPHrine 0.3 mg/0.3 mL IJ SOAJ injection Inject 0.3 mg into the muscle once.    . escitalopram (LEXAPRO) 20 MG tablet Take 1 tablet (20 mg total) by mouth daily. 30 tablet 1  . glipiZIDE (GLUCOTROL) 10 MG tablet Take 1 tablet (10 mg total) by mouth 2 (two) times daily before a meal. 30 tablet 1  . hydrOXYzine (ATARAX/VISTARIL) 50 MG tablet Take 50 mg by mouth daily as needed (sleep).    Marland Kitchen lisinopril (PRINIVIL,ZESTRIL) 10 MG tablet Take 1 tablet (10 mg total) by mouth daily. 30 tablet 1  . metFORMIN (GLUCOPHAGE) 1000 MG tablet Take 1 tablet (1,000 mg total) by mouth 2 (two) times daily with a meal. 60 tablet 1  . omeprazole (PRILOSEC) 20 MG capsule Take 1 capsule (20 mg  total) by mouth daily. 30 capsule 5  . ondansetron (ZOFRAN) 8 MG tablet Take 1 tablet (8 mg total) by mouth every 8 (eight) hours as needed for nausea or vomiting. 20 tablet 1  . rosuvastatin (CRESTOR) 40 MG tablet Take 1 tablet (40 mg total) by mouth daily. 30 tablet 1   No facility-administered medications prior to visit.      Past Medical History:  Diagnosis Date  . Depression 04/06/2018  . Diabetes type 1, controlled (HCC) 04/06/2018  . HIV (human immunodeficiency virus infection) (HCC) 04/06/2018  . Hypertension   . Nausea in adult 04/06/2018     Past Surgical History:  Procedure Laterality Date  . Bilaterla mesh for Hernia Bilateral 2009  . CHOLECYSTECTOMY         Review of Systems  Constitutional: Negative for appetite change, chills, fatigue, fever and unexpected weight change.  Eyes: Negative for visual disturbance.  Respiratory: Negative for cough, chest tightness, shortness of breath and wheezing.   Cardiovascular: Negative for chest pain and leg swelling.  Gastrointestinal: Negative for abdominal pain, constipation, diarrhea, nausea and vomiting.  Genitourinary: Negative for dysuria, flank pain, frequency, genital sores, hematuria and urgency.  Skin: Negative for rash.  Allergic/Immunologic: Negative for immunocompromised state.  Neurological: Negative for dizziness and headaches.      Objective:    There were no vitals taken for this visit. Nursing note and vital signs  reviewed.  Physical Exam Constitutional:      General: He is not in acute distress.    Appearance: He is well-developed.  Eyes:     Conjunctiva/sclera: Conjunctivae normal.  Neck:     Musculoskeletal: Neck supple.  Cardiovascular:     Rate and Rhythm: Normal rate and regular rhythm.     Heart sounds: Normal heart sounds. No murmur. No friction rub. No gallop.   Pulmonary:     Effort: Pulmonary effort is normal. No respiratory distress.     Breath sounds: Normal breath sounds. No  wheezing or rales.  Chest:     Chest wall: No tenderness.  Abdominal:     General: Bowel sounds are normal.     Palpations: Abdomen is soft.     Tenderness: There is no abdominal tenderness.  Lymphadenopathy:     Cervical: No cervical adenopathy.  Skin:    General: Skin is warm and dry.     Findings: No rash.  Neurological:     Mental Status: He is alert and oriented to person, place, and time.  Psychiatric:        Behavior: Behavior normal.        Thought Content: Thought content normal.        Judgment: Judgment normal.        Assessment & Plan:   Problem List Items Addressed This Visit    None       I am having Chauncy Passy maintain his hydrOXYzine, albuterol, EPINEPHrine, bictegravir-emtricitabine-tenofovir AF, escitalopram, glipiZIDE, lisinopril, metFORMIN, ondansetron, rosuvastatin, Dulaglutide, and omeprazole.   No orders of the defined types were placed in this encounter.    Follow-up: No follow-ups on file.   Marcos Eke, MSN, FNP-C Nurse Practitioner [facility] for Infectious Disease [facility] Office phone: [phone] Pager: [phone] [facility] Main number: [phone]

## 2018-07-25 ENCOUNTER — Telehealth: Payer: Self-pay

## 2018-07-25 NOTE — Telephone Encounter (Signed)
-----   Message from Lucina Mellow sent at 07/24/2018  8:41 AM EDT ----- Patient called to reschedule appointment, but only issue is he is running low on medication.  If you could give him a call back at [phone]

## 2018-07-31 ENCOUNTER — Other Ambulatory Visit: Payer: Self-pay

## 2018-07-31 ENCOUNTER — Encounter: Payer: Self-pay | Admitting: Family

## 2018-07-31 ENCOUNTER — Ambulatory Visit (INDEPENDENT_AMBULATORY_CARE_PROVIDER_SITE_OTHER): Payer: Self-pay | Admitting: Family

## 2018-07-31 VITALS — BP 131/89 | HR 88 | Temp 98.0°F | Wt 192.0 lb

## 2018-07-31 DIAGNOSIS — E1165 Type 2 diabetes mellitus with hyperglycemia: Secondary | ICD-10-CM

## 2018-07-31 DIAGNOSIS — Z0000 Encounter for general adult medical examination without abnormal findings: Secondary | ICD-10-CM

## 2018-07-31 DIAGNOSIS — Z113 Encounter for screening for infections with a predominantly sexual mode of transmission: Secondary | ICD-10-CM

## 2018-07-31 DIAGNOSIS — Z23 Encounter for immunization: Secondary | ICD-10-CM

## 2018-07-31 DIAGNOSIS — I10 Essential (primary) hypertension: Secondary | ICD-10-CM

## 2018-07-31 DIAGNOSIS — F3342 Major depressive disorder, recurrent, in full remission: Secondary | ICD-10-CM

## 2018-07-31 DIAGNOSIS — B20 Human immunodeficiency virus [HIV] disease: Secondary | ICD-10-CM

## 2018-07-31 MED ORDER — ROSUVASTATIN CALCIUM 40 MG PO TABS: 40.0000 mg | ORAL_TABLET | Freq: Every day | ORAL | 1 refills | Status: DC

## 2018-07-31 MED ORDER — ESCITALOPRAM OXALATE 20 MG PO TABS: 20.0000 mg | ORAL_TABLET | Freq: Every day | ORAL | 1 refills | Status: DC

## 2018-07-31 MED ORDER — AMLODIPINE BESYLATE 10 MG PO TABS: 10.0000 mg | ORAL_TABLET | Freq: Every day | ORAL | 2 refills | Status: DC

## 2018-07-31 MED ORDER — GLIPIZIDE 10 MG PO TABS: 10.0000 mg | ORAL_TABLET | Freq: Two times a day (BID) | ORAL | 1 refills | Status: DC

## 2018-07-31 MED ORDER — BICTEGRAVIR-EMTRICITAB-TENOFOV 50-200-25 MG PO TABS: 1.0000 | ORAL_TABLET | Freq: Every day | ORAL | 3 refills | Status: DC

## 2018-07-31 MED ORDER — METFORMIN HCL 1000 MG PO TABS: 1000.0000 mg | ORAL_TABLET | Freq: Two times a day (BID) | ORAL | 1 refills | Status: DC

## 2018-07-31 NOTE — Assessment & Plan Note (Signed)
Adam Chan has well-controlled HIV disease with good adherence and tolerance to his regimen of Biktarvy.  He has no signs/symptoms of opportunistic infection or progressive HIV disease at present.  Financial assistance is up-to-date and covered through September 2020.  Continue current dose of Biktarvy.  Plan for follow-up in 3 months or sooner if needed with blood work 1 to 2 weeks prior to appointment.

## 2018-07-31 NOTE — Assessment & Plan Note (Signed)
Adam Chan continues to have labile blood sugars although appears to be with improved control with his current regimen and tolerating it with no adverse side effects or hypoglycemic readings.  He is down 4 pounds from last office visit.  Encouraged to continue decreasing processed/sugary foods and carbohydrates as able.  No signs/symptoms of endorgan damage at present.  Plan to recheck hemoglobin A1c at next office visit.  Continue current dose of metformin, glipizide, and Trulicity.

## 2018-07-31 NOTE — Patient Instructions (Signed)
Nice to see you.  Please continue to take your medication as prescribed.  Continue to work on decreasing carbohydrates and sugary/processed foods to help reduce your blood sugars.  Will check for STI/STD today.  Plan for office follow-up in 3 months or sooner if needed with blood work 1 to 2 weeks prior to appointment.

## 2018-07-31 NOTE — Assessment & Plan Note (Signed)
Blood pressure appears stable with current medication regimen although having side effects with lisinopril.  Discontinue lisinopril.  Start amlodipine.  Encouraged continue weight loss.  No signs/symptoms of endorgan damage at present and denies worst headache of life.  Continue to monitor blood pressures at home as able.  Follow-up in 3 months or sooner if needed.

## 2018-07-31 NOTE — Progress Notes (Signed)
Subjective:    Patient ID: Adam Chan, male    DOB: 1970-12-18, 48 y.o.   MRN: [id number]  Chief Complaint  Patient presents with   HIV Positive/AIDS   Diabetes     HPI:  Adam Chan is a 48 y.o. male who presents today for routine follow-up of HIV disease and diabetes.  Adam Chan was last seen in the office on 06/02/2018 to establish care for HIV disease with good adherence and tolerance to his ART regimen of Biktarvy.  He is also needing refills of his diabetes medications with blood sugars remaining labile and glucose showing 469 during lab work.  Viral load at the time was 57 with a CD4 count of 449.  Hemoglobin A1c was 8.7.  Most recent blood work completed on 07/18/2018 with viral load of 46 and CD4 count of 660.  Random blood sugar of 219.  RPR negative for syphilis.  Kidney function, liver function, electrolytes within normal ranges.  Health maintenance due includes Pneumovax and second dose of Menveo.  Adam Chan has been taking his medications as prescribed with no missed doses.  He has had no side effects with his Biktarvy, however been experiencing a cough with his lisinopril.  He stopped taking lisinopril for 3 days and noticed improvement in the cough.  Overall feeling well today.  Has been decreasing his carbohydrate and processed/sugary food intake with blood sugars ranging from 180 through the 200s which is a significant improvement.  He is also consuming less which he relates to his Trulicity. Denies fevers, chills, night sweats, headaches, changes in vision, neck pain/stiffness, nausea, diarrhea, vomiting, lesions or rashes.  Adam Chan continues to receive his medications with no problems and has renewed his medication assistance with [employer] through September 2020.  He is currently living in Airbnbs and has no problems obtaining food at the present time.  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  Denies any recent feelings of being down,  depressed, or hopeless. No recreational or illicit drug use. Not currently sexually active but would like to be tested for STI/STD.    Allergies  Allergen Reactions   Sulfa Antibiotics Anaphylaxis      Outpatient Medications Prior to Visit  Medication Sig Dispense Refill   albuterol (PROVENTIL HFA;VENTOLIN HFA) 108 (90 Base) MCG/ACT inhaler Inhale 2 puffs into the lungs every 6 (six) hours as needed for wheezing or shortness of breath.     Dulaglutide (TRULICITY) 1.5 MG/0.5ML SOPN Inject 0.5 ml subcutaneously weekly. 2 mL 2   EPINEPHrine 0.3 mg/0.3 mL IJ SOAJ injection Inject 0.3 mg into the muscle once.     hydrOXYzine (ATARAX/VISTARIL) 50 MG tablet Take 50 mg by mouth daily as needed (sleep).     omeprazole (PRILOSEC) 20 MG capsule Take 1 capsule (20 mg total) by mouth daily. 30 capsule 5   ondansetron (ZOFRAN) 8 MG tablet Take 1 tablet (8 mg total) by mouth every 8 (eight) hours as needed for nausea or vomiting. 20 tablet 1   bictegravir-emtricitabine-tenofovir AF (BIKTARVY) 50-200-25 MG TABS tablet Take 1 tablet by mouth daily. 30 tablet 3   escitalopram (LEXAPRO) 20 MG tablet Take 1 tablet (20 mg total) by mouth daily. 30 tablet 1   glipiZIDE (GLUCOTROL) 10 MG tablet Take 1 tablet (10 mg total) by mouth 2 (two) times daily before a meal. 30 tablet 1   lisinopril (PRINIVIL,ZESTRIL) 10 MG tablet Take 1 tablet (10 mg total) by mouth daily. 30 tablet 1  metFORMIN (GLUCOPHAGE) 1000 MG tablet Take 1 tablet (1,000 mg total) by mouth 2 (two) times daily with a meal. 60 tablet 1   rosuvastatin (CRESTOR) 40 MG tablet Take 1 tablet (40 mg total) by mouth daily. 30 tablet 1   No facility-administered medications prior to visit.      Past Medical History:  Diagnosis Date   Depression 04/06/2018   Diabetes type 1, controlled (HCC) 04/06/2018   HIV (human immunodeficiency virus infection) (HCC) 04/06/2018   Hypertension    Nausea in adult 04/06/2018     Past Surgical  History:  Procedure Laterality Date   Bilaterla mesh for Hernia Bilateral 2009   CHOLECYSTECTOMY         Review of Systems  Constitutional: Negative for appetite change, chills, fatigue, fever and unexpected weight change.  Eyes: Negative for visual disturbance.       Negative for changes in vision.  Respiratory: Negative for cough, chest tightness, shortness of breath and wheezing.   Cardiovascular: Negative for chest pain, palpitations and leg swelling.  Gastrointestinal: Negative for abdominal pain, constipation, diarrhea, nausea and vomiting.  Endocrine: Negative for polydipsia, polyphagia and polyuria.  Genitourinary: Negative for dysuria, flank pain, frequency, genital sores, hematuria and urgency.  Skin: Negative for rash.  Allergic/Immunologic: Negative for immunocompromised state.  Neurological: Negative for dizziness, weakness, light-headedness, numbness and headaches.      Objective:    BP 131/89    Pulse 88    Temp 98 F (36.7 C) (Oral)    Wt 192 lb (87.1 kg)    BMI 30.07 kg/m  Nursing note and vital signs reviewed.   Physical Exam Constitutional:      General: He is not in acute distress.    Appearance: Normal appearance. He is well-developed.  HENT:     Mouth/Throat:     Mouth: Mucous membranes are moist.     Dentition: Abnormal dentition.     Pharynx: Oropharynx is clear.  Eyes:     Conjunctiva/sclera: Conjunctivae normal.  Neck:     Musculoskeletal: Neck supple.  Cardiovascular:     Rate and Rhythm: Normal rate and regular rhythm.     Heart sounds: Normal heart sounds. No murmur. No friction rub. No gallop.   Pulmonary:     Effort: Pulmonary effort is normal. No respiratory distress.     Breath sounds: Normal breath sounds. No wheezing, rhonchi or rales.  Chest:     Chest wall: No tenderness.  Abdominal:     General: Abdomen is flat. Bowel sounds are normal. There is no distension.     Palpations: Abdomen is soft.     Tenderness: There is no  abdominal tenderness.  Lymphadenopathy:     Cervical: No cervical adenopathy.  Skin:    General: Skin is warm and dry.     Findings: No rash.  Neurological:     Mental Status: He is alert.  Psychiatric:        Mood and Affect: Mood normal.        Assessment & Plan:   Problem List Items Addressed This Visit      Cardiovascular and Mediastinum   Essential hypertension    Blood pressure appears stable with current medication regimen although having side effects with lisinopril.  Discontinue lisinopril.  Start amlodipine.  Encouraged continue weight loss.  No signs/symptoms of endorgan damage at present and denies worst headache of life.  Continue to monitor blood pressures at home as able.  Follow-up in 3 months or  sooner if needed.      Relevant Medications   amLODipine (NORVASC) 10 MG tablet   rosuvastatin (CRESTOR) 40 MG tablet     Endocrine   Type 2 diabetes mellitus with hyperglycemia, without long-term current use of insulin [facility])    Adam Chan continues to have labile blood sugars although appears to be with improved control with his current regimen and tolerating it with no adverse side effects or hypoglycemic readings.  He is down 4 pounds from last office visit.  Encouraged to continue decreasing processed/sugary foods and carbohydrates as able.  No signs/symptoms of endorgan damage at present.  Plan to recheck hemoglobin A1c at next office visit.  Continue current dose of metformin, glipizide, and Trulicity.      Relevant Medications   glipiZIDE (GLUCOTROL) 10 MG tablet   metFORMIN (GLUCOPHAGE) 1000 MG tablet   rosuvastatin (CRESTOR) 40 MG tablet   Other Relevant Orders   Comprehensive metabolic panel   HgB A1c   Urine Microalbumin w/creat. ratio     Other   Human immunodeficiency virus (HIV) disease (HCC) - Primary    Adam Chan has well-controlled HIV disease with good adherence and tolerance to his regimen of Biktarvy.  He has no signs/symptoms of  opportunistic infection or progressive HIV disease at present.  Financial assistance is up-to-date and covered through September 2020.  Continue current dose of Biktarvy.  Plan for follow-up in 3 months or sooner if needed with blood work 1 to 2 weeks prior to appointment.      Relevant Medications   bictegravir-emtricitabine-tenofovir AF (BIKTARVY) 50-200-25 MG TABS tablet   Other Relevant Orders   T-helper cell (CD4)- ([facility] clinic only)   CBC   Comprehensive metabolic panel   MENINGOCOCCAL MCV4O (Completed)   Pneumococcal polysaccharide vaccine 23-valent greater than or equal to 2yo subcutaneous/IM (Completed)   Recurrent major depressive disorder, in full remission (HCC)   Relevant Medications   escitalopram (LEXAPRO) 20 MG tablet   Healthcare maintenance     Pneumovax and Menveo updated today  Not currently sexually active, but reminded of the importance of safe sexual practice to reduce risk of acquisition or transmission of STI.  Obtain oral/rectal swabs for gonorrhea/chlamydia per patient request.       Other Visit Diagnoses    Screening for STDs (sexually transmitted diseases)       Relevant Orders   RPR   Cytology (oral, anal, urethral) ancillary only   Cytology (oral, anal, urethral) ancillary only   Need for meningococcal vaccination       Relevant Orders   MENINGOCOCCAL MCV4O (Completed)   Need for pneumococcal vaccination       Relevant Orders   Pneumococcal polysaccharide vaccine 23-valent greater than or equal to 2yo subcutaneous/IM (Completed)       I have discontinued Zidane Crigger's lisinopril. I am also having him start on amLODipine. Additionally, I am having him maintain his hydrOXYzine, albuterol, EPINEPHrine, ondansetron, Dulaglutide, omeprazole, glipiZIDE, metFORMIN, escitalopram, rosuvastatin, and bictegravir-emtricitabine-tenofovir AF.   Meds ordered this encounter  Medications   amLODipine (NORVASC) 10 MG tablet    Sig: Take 1 tablet (10 mg  total) by mouth daily.    Dispense:  30 tablet    Refill:  2    Order Specific Question:   Supervising Provider    Answer:   Drue Second, CYNTHIA [[id number]]   glipiZIDE (GLUCOTROL) 10 MG tablet    Sig: Take 1 tablet (10 mg total) by mouth 2 (two) times daily before a meal.  Dispense:  30 tablet    Refill:  1    Order Specific Question:   Supervising Provider    Answer:   Drue Second, CYNTHIA [[id number]]   metFORMIN (GLUCOPHAGE) 1000 MG tablet    Sig: Take 1 tablet (1,000 mg total) by mouth 2 (two) times daily with a meal.    Dispense:  60 tablet    Refill:  1    Order Specific Question:   Supervising Provider    Answer:   Drue Second, CYNTHIA [[id number]]   escitalopram (LEXAPRO) 20 MG tablet    Sig: Take 1 tablet (20 mg total) by mouth daily.    Dispense:  30 tablet    Refill:  1    Order Specific Question:   Supervising Provider    Answer:   Drue Second, CYNTHIA [[id number]]   rosuvastatin (CRESTOR) 40 MG tablet    Sig: Take 1 tablet (40 mg total) by mouth daily.    Dispense:  30 tablet    Refill:  1    Order Specific Question:   Supervising Provider    Answer:   Drue Second, CYNTHIA [[id number]]   bictegravir-emtricitabine-tenofovir AF (BIKTARVY) 50-200-25 MG TABS tablet    Sig: Take 1 tablet by mouth daily.    Dispense:  30 tablet    Refill:  3    Order Specific Question:   Supervising Provider    Answer:   Judyann Munson [[id number]]     Follow-up: Return in about 3 months (around 10/31/2018), or if symptoms worsen or fail to improve.   Marcos Eke, MSN, FNP-C Nurse Practitioner [place] [facility] Office phone: [phone] Pager: [phone] [facility] Main number: [phone]

## 2018-07-31 NOTE — Assessment & Plan Note (Signed)
   Pneumovax and Menveo updated today  Not currently sexually active, but reminded of the importance of safe sexual practice to reduce risk of acquisition or transmission of STI.  Obtain oral/rectal swabs for gonorrhea/chlamydia per patient request.

## 2018-08-01 LAB — CYTOLOGY, (ORAL, ANAL, URETHRAL) ANCILLARY ONLY
Chlamydia: NEGATIVE
Chlamydia: POSITIVE — AB
NEISSERIA GONORRHEA: NEGATIVE
NEISSERIA GONORRHEA: POSITIVE — AB

## 2018-08-02 ENCOUNTER — Telehealth: Payer: Self-pay | Admitting: Behavioral Health

## 2018-08-02 NOTE — Telephone Encounter (Signed)
Yes, that would be great, thank you.

## 2018-08-02 NOTE — Telephone Encounter (Signed)
Patient's anal swab was positive for both Gonorrhea and Chlamydia.  Would you like to bring patient in the clinic to be treated with Azithromycin 1 gram PO x1 and Rocephin 250mg  IM X1. Angeline Slim RN

## 2018-08-03 NOTE — Telephone Encounter (Signed)
Called patient and informed him per Marcos Eke that his anal swab was positive for Gonorrhea and Chlamydia.  Scheduled patient an appointment to come in for treatment.  Patient states he is allergic to Sulfa however he states when he was treated for Chlamydia in the past he developed Stevens-Johnsons Syndrome and almost dies.  Informed patient Marcos Eke would be made aware and will call patient back with updates.  Also informed him no sexually activity until 10 days after treament and to inform recent partners to be tested and treated at the health department. Angeline Slim RN

## 2018-08-04 NOTE — Telephone Encounter (Signed)
There is no sulfa in this regimen to be concerned about. We would need to find out what he was treated with before that resulted in Stevens-Johnson Syndrome. Our only other alternative would be gentamycin or gemifloxacin.

## 2018-08-07 ENCOUNTER — Ambulatory Visit (INDEPENDENT_AMBULATORY_CARE_PROVIDER_SITE_OTHER): Payer: Self-pay | Admitting: Behavioral Health

## 2018-08-07 ENCOUNTER — Other Ambulatory Visit: Payer: Self-pay

## 2018-08-07 DIAGNOSIS — F3342 Major depressive disorder, recurrent, in full remission: Secondary | ICD-10-CM

## 2018-08-07 DIAGNOSIS — A749 Chlamydial infection, unspecified: Secondary | ICD-10-CM

## 2018-08-07 DIAGNOSIS — A64 Unspecified sexually transmitted disease: Secondary | ICD-10-CM

## 2018-08-07 DIAGNOSIS — A549 Gonococcal infection, unspecified: Secondary | ICD-10-CM

## 2018-08-07 MED ORDER — CEFTRIAXONE SODIUM 250 MG IJ SOLR
250.0000 mg | Freq: Once | INTRAMUSCULAR | Status: AC
  Administered 2018-08-07: 250 mg via INTRAMUSCULAR

## 2018-08-07 MED ORDER — AZITHROMYCIN 250 MG PO TABS
1000.0000 mg | ORAL_TABLET | Freq: Once | ORAL | Status: AC
  Administered 2018-08-07: 1000 mg via ORAL

## 2018-08-07 NOTE — Progress Notes (Signed)
Patient came in today for Gonorrhea and Chlamydia treatment.  Patient tolerated well. Patient verified he is allergic to Sulfa and developed Stevens-Johnsons Syndrome.  Patient states he has been treated for Chlamydia in the past with no problem.  Offered patient condoms and he refused.  Informed him to contact recent partners and have them tested and treated at the health department.  Patient verbalized understanding.  Patient also aware to call the office if he has any adverse effects from the medications that were administered today. Angeline Slim RN

## 2018-08-07 NOTE — Telephone Encounter (Addendum)
Patient came in today for treatment for Gonorrhea and Chlamydia.  Spoke to patient in more detail about when he developed Viviann Spare- Johnsons syndrome in the past. He states he now is sure it was after taking Bactrim.  He states he remembers being treated for Chlamydia and had no problem.  Patient was given  Azithromycin 1 gram PO X 1 and Rocephin 250mg  IM x1.  Patient stayed to be observed after medication administration and had no complaints.  Patient informed to call the office if he has any reactions from the medications.  Patient verbalized understanding. Angeline Slim RN

## 2018-08-09 ENCOUNTER — Encounter: Payer: Self-pay | Admitting: Family

## 2018-09-27 ENCOUNTER — Other Ambulatory Visit: Payer: Self-pay | Admitting: Family

## 2018-09-27 DIAGNOSIS — E1165 Type 2 diabetes mellitus with hyperglycemia: Secondary | ICD-10-CM

## 2018-09-27 MED ORDER — DULAGLUTIDE 1.5 MG/0.5ML ~~LOC~~ SOAJ: SUBCUTANEOUS | 2 refills | Status: DC

## 2018-09-29 ENCOUNTER — Other Ambulatory Visit: Payer: Self-pay | Admitting: Family

## 2018-09-29 DIAGNOSIS — B20 Human immunodeficiency virus [HIV] disease: Secondary | ICD-10-CM

## 2018-09-29 DIAGNOSIS — E1165 Type 2 diabetes mellitus with hyperglycemia: Secondary | ICD-10-CM

## 2018-09-29 DIAGNOSIS — F3342 Major depressive disorder, recurrent, in full remission: Secondary | ICD-10-CM

## 2018-10-19 ENCOUNTER — Telehealth: Payer: Self-pay | Admitting: Pharmacy Technician

## 2018-10-19 NOTE — Telephone Encounter (Addendum)
[facility], pharmacy of [place], has shipped a four month supply of Trulicity to the clinic for the patient to pick up.  It is in the clinic medication refrigerator.  He picked up the medication 10/26/2018 in the afternoon.  Venida Jarvis. Nadara Mustard [place] Patient [facility] Phone: [phone] Fax:  [phone]

## 2018-10-19 NOTE — Telephone Encounter (Signed)
Great, thank you!

## 2018-10-31 ENCOUNTER — Other Ambulatory Visit: Payer: Self-pay

## 2018-10-31 ENCOUNTER — Other Ambulatory Visit: Payer: PRIVATE HEALTH INSURANCE

## 2018-10-31 DIAGNOSIS — Z113 Encounter for screening for infections with a predominantly sexual mode of transmission: Secondary | ICD-10-CM

## 2018-10-31 DIAGNOSIS — B20 Human immunodeficiency virus [HIV] disease: Secondary | ICD-10-CM

## 2018-10-31 DIAGNOSIS — E1165 Type 2 diabetes mellitus with hyperglycemia: Secondary | ICD-10-CM

## 2018-11-01 LAB — COMPREHENSIVE METABOLIC PANEL
AG Ratio: 1.4 (calc) (ref 1.0–2.5)
ALT: 77 U/L — ABNORMAL HIGH (ref 9–46)
AST: 59 U/L — ABNORMAL HIGH (ref 10–35)
Albumin: 4.1 g/dL (ref 3.6–5.1)
Alkaline phosphatase (APISO): 85 U/L (ref 35–144)
BUN: 12 mg/dL (ref 7–25)
CO2: 24 mmol/L (ref 20–32)
Calcium: 9.4 mg/dL (ref 8.6–10.3)
Chloride: 104 mmol/L (ref 98–110)
Creat: 0.83 mg/dL (ref 0.70–1.33)
Globulin: 3 g/dL (calc) (ref 1.9–3.7)
Glucose, Bld: 311 mg/dL — ABNORMAL HIGH (ref 65–99)
Potassium: 4.1 mmol/L (ref 3.5–5.3)
Sodium: 137 mmol/L (ref 135–146)
Total Bilirubin: 0.6 mg/dL (ref 0.2–1.2)
Total Protein: 7.1 g/dL (ref 6.1–8.1)

## 2018-11-01 LAB — CBC
HCT: 44.4 % (ref 38.5–50.0)
Hemoglobin: 14.4 g/dL (ref 13.2–17.1)
MCH: 28.5 pg (ref 27.0–33.0)
MCHC: 32.4 g/dL (ref 32.0–36.0)
MCV: 87.7 fL (ref 80.0–100.0)
MPV: 9.7 fL (ref 7.5–12.5)
Platelets: 243 10*3/uL (ref 140–400)
RBC: 5.06 10*6/uL (ref 4.20–5.80)
RDW: 15.7 % — ABNORMAL HIGH (ref 11.0–15.0)
WBC: 5.2 10*3/uL (ref 3.8–10.8)

## 2018-11-01 LAB — MICROALBUMIN / CREATININE URINE RATIO
Creatinine, Urine: 64 mg/dL (ref 20–320)
Microalb Creat Ratio: 3 mcg/mg creat (ref <30)
Microalb, Ur: 0.2 mg/dL

## 2018-11-01 LAB — T-HELPER CELL (CD4) - (RCID CLINIC ONLY)
CD4 % Helper T Cell: 43 % (ref 33–65)
CD4 T Cell Abs: 973 /uL (ref 400–1790)

## 2018-11-01 LAB — RPR: RPR Ser Ql: NONREACTIVE

## 2018-11-06 ENCOUNTER — Other Ambulatory Visit: Payer: Self-pay | Admitting: Family

## 2018-11-06 DIAGNOSIS — E1165 Type 2 diabetes mellitus with hyperglycemia: Secondary | ICD-10-CM

## 2018-11-14 ENCOUNTER — Ambulatory Visit (INDEPENDENT_AMBULATORY_CARE_PROVIDER_SITE_OTHER): Payer: Self-pay | Admitting: Family

## 2018-11-14 ENCOUNTER — Encounter: Payer: Self-pay | Admitting: Family

## 2018-11-14 ENCOUNTER — Encounter: Payer: PRIVATE HEALTH INSURANCE | Admitting: Family

## 2018-11-14 ENCOUNTER — Other Ambulatory Visit: Payer: Self-pay

## 2018-11-14 VITALS — BP 146/101 | HR 99 | Temp 98.4°F | Wt 192.0 lb

## 2018-11-14 DIAGNOSIS — E1165 Type 2 diabetes mellitus with hyperglycemia: Secondary | ICD-10-CM

## 2018-11-14 DIAGNOSIS — B20 Human immunodeficiency virus [HIV] disease: Secondary | ICD-10-CM

## 2018-11-14 DIAGNOSIS — I10 Essential (primary) hypertension: Secondary | ICD-10-CM

## 2018-11-14 DIAGNOSIS — Z0000 Encounter for general adult medical examination without abnormal findings: Secondary | ICD-10-CM

## 2018-11-14 MED ORDER — HYDROCHLOROTHIAZIDE 25 MG PO TABS: 25.0000 mg | ORAL_TABLET | Freq: Every day | ORAL | 2 refills | Status: DC

## 2018-11-14 MED ORDER — BIKTARVY 50-200-25 MG PO TABS: 1.0000 | ORAL_TABLET | Freq: Every day | ORAL | 3 refills | Status: DC

## 2018-11-14 NOTE — Patient Instructions (Signed)
Nice to see you.  Please continue to take your medications as prescribed.  Start taking the hydrochlorothiazide.  Refills have been sent to the pharmacy.   We will plan to follow up in 3 months or sooner if needed with lab work 1-2 weeks prior to appointment.   Recommend less than 100g of carbohydrates per day.   Check out [email or web address].com and "The Diabetes Code" by Dr. Sharman Cheek.

## 2018-11-14 NOTE — Assessment & Plan Note (Signed)
Poorly controlled and above goal 140/90 with intolerance to amlodipine secondary to lower extremity edema.  Denies headaches, blurred vision, or changes in vision.  Blood work without obvious evidence of endorgan damage.  Discontinue amlodipine.  Start hydrochlorothiazide.  Encouraged to continue monitoring blood pressure at home.  Continue to monitor.

## 2018-11-14 NOTE — Assessment & Plan Note (Signed)
Adam Chan has well-controlled HIV disease with good adherence and tolerance to his ART regimen of Biktarvy.  No signs/symptoms of opportunistic infection or progressive HIV disease.  He has no problems obtaining medication. Renew [employer] today.  Continue current dose of Biktarvy.  Plan for follow-up in 3 months or sooner if needed with lab work 1 to 2 weeks prior to appointment.

## 2018-11-14 NOTE — Assessment & Plan Note (Addendum)
   Due for second dose of Menveo at next office visit.  Discussed importance of safe sexual practice to reduce risk of acquisition/transmission of STI.  Condoms declined  We will need to get colonoscopy scheduled for colon cancer screening.

## 2018-11-14 NOTE — Assessment & Plan Note (Signed)
No recent A1c obtained, however random blood sugar of 311 indicating poor control.  Discussed importance of reducing carbohydrate intake as he has been consuming a significant amount of juices and wine.  We discussed lowering his carbohydrates to 100 g/day with the goal of decreasing medications.  Continue current dose of Trulicity, metformin, and glipizide at this time.  Encouraged to monitor blood sugars at home.  Follow-up A1c at next office visit.

## 2018-11-14 NOTE — Progress Notes (Signed)
Subjective:    Patient ID: Adam Chan, male    DOB: 07/15/1970, 48 y.o.   MRN: [id number]  Chief Complaint  Patient presents with  . HIV Positive/AIDS  . Diabetes  . Hypertension     HPI:  Adam Chan is a 48 y.o. male with HIV, diabetes, and hypertension who was last seen in the office on 07/31/2018 for routine follow-up with good adherence and tolerance to his ART regimen of Biktarvy.  Viral load at the time was 46 and undetectable with CD4 count of 660.  Most recent blood work completed on 10/31/2018 with viral load that remains undetectable and CD4 count of 973.  Blood sugar noted to be 311.  Healthcare maintenance due includes second dose of Menveo.  Adam Chan has been taking his Biktarvy as prescribed with no adverse side effects or missed doses.  Overall he is feeling well today with no new concerns.  He did note increased lower extremity edema with his amlodipine and has since stopped taking the medication for his blood pressure.  Blood pressures have been elevated above goal 140/90 at home.  Denies headaches, blurred vision, or changes in vision.  He has had increased thirst recently and has been drinking more apple juice.Denies fevers, chills, night sweats, headaches, changes in vision, neck pain/stiffness, nausea, diarrhea, vomiting, lesions or rashes.  Adam Chan remains covered through medication assistance and has no problems obtaining his medications from the pharmacy.  Denies feelings of being down, depressed, or hopeless recently.  Continues to work full-time.  He is renting a room from a roommate and eager to get a place of his own.  No recreational or illicit drug use, tobacco use, and drinks approximately 1 to 2 glasses of wine per night.  He is not currently sexually active.  Allergies  Allergen Reactions  . Sulfa Antibiotics Anaphylaxis      Outpatient Medications Prior to Visit  Medication Sig Dispense Refill  . albuterol (PROVENTIL HFA;VENTOLIN HFA)  108 (90 Base) MCG/ACT inhaler Inhale 2 puffs into the lungs every 6 (six) hours as needed for wheezing or shortness of breath.    . CRESTOR 40 MG tablet TAKE 1 TABLET(40 MG) BY MOUTH DAILY 30 tablet 5  . Dulaglutide (TRULICITY) 1.5 MG/0.5ML SOPN Inject 0.5 ml subcutaneously weekly. 2 mL 2  . EPINEPHrine 0.3 mg/0.3 mL IJ SOAJ injection Inject 0.3 mg into the muscle once.    . escitalopram (LEXAPRO) 20 MG tablet TAKE 1 TABLET(20 MG) BY MOUTH DAILY 30 tablet 1  . glipiZIDE (GLUCOTROL) 10 MG tablet TAKE 1 TABLET(10 MG) BY MOUTH TWICE DAILY BEFORE A MEAL 30 tablet 1  . hydrOXYzine (ATARAX/VISTARIL) 50 MG tablet Take 50 mg by mouth daily as needed (sleep).    . metFORMIN (GLUCOPHAGE) 1000 MG tablet TAKE 1 TABLET(1000 MG) BY MOUTH TWICE DAILY WITH A MEAL 60 tablet 1  . omeprazole (PRILOSEC) 20 MG capsule Take 1 capsule (20 mg total) by mouth daily. 30 capsule 5  . ondansetron (ZOFRAN) 8 MG tablet TAKE 1 TABLET(8 MG) BY MOUTH EVERY 8 HOURS AS NEEDED FOR NAUSEA OR VOMITING 20 tablet 1  . amLODipine (NORVASC) 10 MG tablet Take 1 tablet (10 mg total) by mouth daily. 30 tablet 2  . bictegravir-emtricitabine-tenofovir AF (BIKTARVY) 50-200-25 MG TABS tablet Take 1 tablet by mouth daily. 30 tablet 3   No facility-administered medications prior to visit.      Past Medical History:  Diagnosis Date  . Depression 04/06/2018  . Diabetes type 1,  controlled (HCC) 04/06/2018  . HIV (human immunodeficiency virus infection) (HCC) 04/06/2018  . Hypertension   . Nausea in adult 04/06/2018     Past Surgical History:  Procedure Laterality Date  . Bilaterla mesh for Hernia Bilateral 2009  . CHOLECYSTECTOMY         Review of Systems  Constitutional: Negative for appetite change, chills, fatigue, fever and unexpected weight change.  Eyes: Negative for visual disturbance.  Respiratory: Negative for cough, chest tightness, shortness of breath and wheezing.   Cardiovascular: Negative for chest pain and leg  swelling.  Gastrointestinal: Negative for abdominal pain, constipation, diarrhea, nausea and vomiting.  Genitourinary: Negative for dysuria, flank pain, frequency, genital sores, hematuria and urgency.  Skin: Negative for rash.  Allergic/Immunologic: Negative for immunocompromised state.  Neurological: Negative for dizziness and headaches.      Objective:    BP (!) 146/101   Pulse 99   Temp 98.4 F (36.9 C)   Wt 192 lb (87.1 kg)   BMI 30.07 kg/m  Nursing note and vital signs reviewed.  Physical Exam Constitutional:      General: He is not in acute distress.    Appearance: He is well-developed.  Eyes:     Conjunctiva/sclera: Conjunctivae normal.  Neck:     Musculoskeletal: Neck supple.  Cardiovascular:     Rate and Rhythm: Normal rate and regular rhythm.     Heart sounds: Normal heart sounds. No murmur. No friction rub. No gallop.   Pulmonary:     Effort: Pulmonary effort is normal. No respiratory distress.     Breath sounds: Normal breath sounds. No wheezing or rales.  Chest:     Chest wall: No tenderness.  Abdominal:     General: Bowel sounds are normal.     Palpations: Abdomen is soft.     Tenderness: There is no abdominal tenderness.  Lymphadenopathy:     Cervical: No cervical adenopathy.  Skin:    General: Skin is warm and dry.     Findings: No rash.  Neurological:     Mental Status: He is alert and oriented to person, place, and time.  Psychiatric:        Behavior: Behavior normal.        Thought Content: Thought content normal.        Judgment: Judgment normal.        Assessment & Plan:   Problem List Items Addressed This Visit      Cardiovascular and Mediastinum   Essential hypertension    Poorly controlled and above goal 140/90 with intolerance to amlodipine secondary to lower extremity edema.  Denies headaches, blurred vision, or changes in vision.  Blood work without obvious evidence of endorgan damage.  Discontinue amlodipine.  Start  hydrochlorothiazide.  Encouraged to continue monitoring blood pressure at home.  Continue to monitor.      Relevant Medications   hydrochlorothiazide (HYDRODIURIL) 25 MG tablet     Endocrine   Type 2 diabetes mellitus with hyperglycemia, without long-term current use of insulin (HCC)    No recent A1c obtained, however random blood sugar of 311 indicating poor control.  Discussed importance of reducing carbohydrate intake as he has been consuming a significant amount of juices and wine.  We discussed lowering his carbohydrates to 100 g/day with the goal of decreasing medications.  Continue current dose of Trulicity, metformin, and glipizide at this time.  Encouraged to monitor blood sugars at home.  Follow-up A1c at next office visit.  Other   Human immunodeficiency virus (HIV) disease ([place]) - Primary    Adam Chan has well-controlled HIV disease with good adherence and tolerance to his ART regimen of Biktarvy.  No signs/symptoms of opportunistic infection or progressive HIV disease.  He has no problems obtaining medication. Renew UMAP today.  Continue current dose of Biktarvy.  Plan for follow-up in 3 months or sooner if needed with lab work 1 to 2 weeks prior to appointment.      Relevant Medications   bictegravir-emtricitabine-tenofovir AF (BIKTARVY) 50-200-25 MG TABS tablet   Healthcare maintenance     Due for second dose of Menveo at next office visit.  Discussed importance of safe sexual practice to reduce risk of acquisition/transmission of STI.  Condoms declined  We will need to get colonoscopy scheduled for colon cancer screening.          I have discontinued Adam Chan's amLODipine. I am also having him start on hydrochlorothiazide. Additionally, I am having him maintain his hydrOXYzine, albuterol, EPINEPHrine, omeprazole, Dulaglutide, glipiZIDE, metFORMIN, escitalopram, ondansetron, Crestor, and Biktarvy.   Meds ordered this encounter  Medications  .  hydrochlorothiazide (HYDRODIURIL) 25 MG tablet    Sig: Take 1 tablet (25 mg total) by mouth daily.    Dispense:  30 tablet    Refill:  2    Order Specific Question:   Supervising Provider    Answer:   Carlyle Basques [[id number]]  . bictegravir-emtricitabine-tenofovir AF (BIKTARVY) 50-200-25 MG TABS tablet    Sig: Take 1 tablet by mouth daily.    Dispense:  30 tablet    Refill:  3    Order Specific Question:   Supervising Provider    Answer:   Carlyle Basques [[id number]]     Follow-up: Return in about 3 months (around 02/14/2019), or if symptoms worsen or fail to improve.   Terri Piedra, MSN, FNP-C Nurse Practitioner [facility] Max number: [phone]

## 2018-11-20 ENCOUNTER — Telehealth: Payer: Self-pay | Admitting: *Deleted

## 2018-11-20 NOTE — Telephone Encounter (Signed)
Patient called to report that he is having chills and has no sense of taste. He took a covid test last week (not sure of day) and should be getting results today or tomorrow. He has NO other symptoms and wants an antibiotic called in. Advised him even if he does have Covid we do not have anything to treat it just the symptoms and antibiotics are not going to help unless he has something else. He advised that Marya Amsler knows he has other things, Diagnosis and would like for me to let him know so he can prescribe him something.

## 2018-11-20 NOTE — Telephone Encounter (Signed)
Patient called and informed to treat symptoms, stay hydrated and get rest. Also if he does have additional symptoms to give [place] a call as things can change. He was not happy but agreed to let us know if he needs something in the future.

## 2018-11-20 NOTE — Telephone Encounter (Signed)
Agree with awaiting testing results for coronavirus as antibiotics will not help if it is positive. Recommend over the counter medications as needed for symptom relief and supportive care.

## 2018-12-04 ENCOUNTER — Other Ambulatory Visit: Payer: Self-pay

## 2018-12-04 DIAGNOSIS — K219 Gastro-esophageal reflux disease without esophagitis: Secondary | ICD-10-CM

## 2018-12-04 MED ORDER — OMEPRAZOLE 20 MG PO CPDR: 20.0000 mg | DELAYED_RELEASE_CAPSULE | Freq: Every day | ORAL | 5 refills | Status: DC

## 2018-12-05 ENCOUNTER — Other Ambulatory Visit: Payer: Self-pay | Admitting: Family

## 2018-12-05 DIAGNOSIS — E1165 Type 2 diabetes mellitus with hyperglycemia: Secondary | ICD-10-CM

## 2018-12-05 DIAGNOSIS — F3342 Major depressive disorder, recurrent, in full remission: Secondary | ICD-10-CM

## 2018-12-05 DIAGNOSIS — I10 Essential (primary) hypertension: Secondary | ICD-10-CM

## 2018-12-06 ENCOUNTER — Encounter: Payer: Self-pay | Admitting: Family

## 2019-02-05 ENCOUNTER — Other Ambulatory Visit: Payer: Self-pay | Admitting: Family

## 2019-02-05 DIAGNOSIS — E1165 Type 2 diabetes mellitus with hyperglycemia: Secondary | ICD-10-CM

## 2019-02-05 DIAGNOSIS — F3342 Major depressive disorder, recurrent, in full remission: Secondary | ICD-10-CM

## 2019-02-12 ENCOUNTER — Other Ambulatory Visit: Payer: Self-pay | Admitting: Family

## 2019-02-13 ENCOUNTER — Other Ambulatory Visit: Payer: Self-pay

## 2019-02-15 ENCOUNTER — Telehealth: Payer: Self-pay | Admitting: *Deleted

## 2019-02-15 ENCOUNTER — Other Ambulatory Visit: Payer: Self-pay

## 2019-02-15 DIAGNOSIS — E1165 Type 2 diabetes mellitus with hyperglycemia: Secondary | ICD-10-CM

## 2019-02-15 DIAGNOSIS — B20 Human immunodeficiency virus [HIV] disease: Secondary | ICD-10-CM

## 2019-02-15 NOTE — Telephone Encounter (Signed)
Approval letter has not come to office. I emailing Iris in [place] to get a status update on the patient. Will call patient back once I get a update. Thank you

## 2019-02-15 NOTE — Telephone Encounter (Signed)
Patient at pharmacy to pick up meds and being told his ADAP has expired. He says he was in back in July to renew so not sure what is going on. Advised will have someone from program give him a call when they are available.

## 2019-02-16 LAB — T-HELPER CELL (CD4) - (RCID CLINIC ONLY)
CD4 % Helper T Cell: 32 % — ABNORMAL LOW (ref 33–65)
CD4 T Cell Abs: 1030 /uL (ref 400–1790)

## 2019-02-20 LAB — HEMOGLOBIN A1C
Hgb A1c MFr Bld: 9.6 % of total Hgb — ABNORMAL HIGH (ref <5.7)
Mean Plasma Glucose: 229 (calc)
eAG (mmol/L): 12.7 (calc)

## 2019-02-20 LAB — COMPREHENSIVE METABOLIC PANEL
AG Ratio: 1.4 (calc) (ref 1.0–2.5)
ALT: 78 U/L — ABNORMAL HIGH (ref 9–46)
AST: 72 U/L — ABNORMAL HIGH (ref 10–35)
Albumin: 4.2 g/dL (ref 3.6–5.1)
Alkaline phosphatase (APISO): 71 U/L (ref 35–144)
BUN: 10 mg/dL (ref 7–25)
CO2: 27 mmol/L (ref 20–32)
Calcium: 9 mg/dL (ref 8.6–10.3)
Chloride: 99 mmol/L (ref 98–110)
Creat: 0.75 mg/dL (ref 0.70–1.33)
Globulin: 3 g/dL (calc) (ref 1.9–3.7)
Glucose, Bld: 226 mg/dL — ABNORMAL HIGH (ref 65–99)
Potassium: 4.3 mmol/L (ref 3.5–5.3)
Sodium: 135 mmol/L (ref 135–146)
Total Bilirubin: 1.6 mg/dL — ABNORMAL HIGH (ref 0.2–1.2)
Total Protein: 7.2 g/dL (ref 6.1–8.1)

## 2019-02-20 LAB — HIV-1 RNA QUANT-NO REFLEX-BLD
HIV 1 RNA Quant: 85 copies/mL — ABNORMAL HIGH
HIV-1 RNA Quant, Log: 1.93 Log copies/mL — ABNORMAL HIGH

## 2019-03-06 ENCOUNTER — Other Ambulatory Visit: Payer: Self-pay

## 2019-03-06 ENCOUNTER — Encounter: Payer: Self-pay | Admitting: Family

## 2019-03-06 ENCOUNTER — Ambulatory Visit (INDEPENDENT_AMBULATORY_CARE_PROVIDER_SITE_OTHER): Payer: Self-pay | Admitting: Family

## 2019-03-06 VITALS — BP 130/95 | HR 103 | Temp 98.4°F | Wt 185.0 lb

## 2019-03-06 DIAGNOSIS — E1165 Type 2 diabetes mellitus with hyperglycemia: Secondary | ICD-10-CM

## 2019-03-06 DIAGNOSIS — B20 Human immunodeficiency virus [HIV] disease: Secondary | ICD-10-CM

## 2019-03-06 DIAGNOSIS — Z23 Encounter for immunization: Secondary | ICD-10-CM

## 2019-03-06 DIAGNOSIS — I10 Essential (primary) hypertension: Secondary | ICD-10-CM

## 2019-03-06 DIAGNOSIS — Z0000 Encounter for general adult medical examination without abnormal findings: Secondary | ICD-10-CM

## 2019-03-06 DIAGNOSIS — Z113 Encounter for screening for infections with a predominantly sexual mode of transmission: Secondary | ICD-10-CM

## 2019-03-06 MED ORDER — ONDANSETRON HCL 8 MG PO TABS: ORAL_TABLET | ORAL | 1 refills | Status: DC

## 2019-03-06 MED ORDER — TRULICITY 1.5 MG/0.5ML ~~LOC~~ SOAJ: SUBCUTANEOUS | 3 refills | Status: DC

## 2019-03-06 MED ORDER — BIKTARVY 50-200-25 MG PO TABS: 1.0000 | ORAL_TABLET | Freq: Every day | ORAL | 3 refills | Status: DC

## 2019-03-06 NOTE — Assessment & Plan Note (Signed)
   Menveo updated today.  Discussed importance of safe sexual practice to reduce risk of acquisition/transmission of STI.  Declines condoms.

## 2019-03-06 NOTE — Patient Instructions (Signed)
Nice to see you.  Continue to work on Conservation officer, historic buildings.  We will call you when your medication arrives.  Continue to take your Graf as prescribed daily.   Refills of medication have been sent to the pharmacy.  Take your Biktarvy at different time than your vitamins.   Plan for follow up in 3 months or sooner if needed with lab work 1-2 weeks prior to appointment.

## 2019-03-06 NOTE — Progress Notes (Signed)
Subjective:    Patient ID: Adam Chan, male    DOB: Jun 07, 1970, 48 y.o.   MRN: [id number]  No chief complaint on file.    HPI:  Adam Chan is a 48 y.o. male with HIV disease, hypertension and diabetes who was last seen in the office on on 11/14/2018 with good adherence and tolerance to his ART regimen of Biktarvy.  Viral load at the time was 46 with a CD4 count of 973.  Most recent blood work completed on 02/15/2019 with viral load of 85 and CD4 count of 1030.  Hemoglobin A1c was 9.6. Health care maintenance due includes second dose of Menveo and influenza vaccination.   Adam Chan continues to take his Biktarvy as prescribed no adverse side effects or missed doses.  Overall feeling well today with no new concerns/complaints. Denies fevers, chills, night sweats, headaches, changes in vision, neck pain/stiffness, nausea, diarrhea, vomiting, lesions or rashes.  Adam Chan has had some mix-ups with his medications recently and being able to fill his medication. This has since been resolved. Denies feelings of being down, depressed or hopeless recently. He does continue to use marijuana on occasion and remains a former smoker. He is working on improving his nutrition and has cut out the juices and drinking a lot more water. Not currently checking his blood pressure at home. He is now working at a nursing care facility. Not currently sexually active.  .   Allergies  Allergen Reactions  . Sulfa Antibiotics Anaphylaxis      Outpatient Medications Prior to Visit  Medication Sig Dispense Refill  . albuterol (PROVENTIL HFA;VENTOLIN HFA) 108 (90 Base) MCG/ACT inhaler Inhale 2 puffs into the lungs every 6 (six) hours as needed for wheezing or shortness of breath.    . CRESTOR 40 MG tablet TAKE 1 TABLET(40 MG) BY MOUTH DAILY 30 tablet 5  . EPINEPHrine 0.3 mg/0.3 mL IJ SOAJ injection Inject 0.3 mg into the muscle once.    . escitalopram (LEXAPRO) 20 MG tablet TAKE 1 TABLET(20 MG) BY  MOUTH DAILY 30 tablet 1  . glipiZIDE (GLUCOTROL) 10 MG tablet TAKE 1 TABLET(10 MG) BY MOUTH TWICE DAILY BEFORE A MEAL 30 tablet 1  . hydrochlorothiazide (HYDRODIURIL) 25 MG tablet TAKE 1 TABLET(25 MG) BY MOUTH DAILY 30 tablet 2  . metFORMIN (GLUCOPHAGE) 1000 MG tablet TAKE 1 TABLET(1000 MG) BY MOUTH TWICE DAILY WITH A MEAL 60 tablet 1  . omeprazole (PRILOSEC) 20 MG capsule Take 1 capsule (20 mg total) by mouth daily. 30 capsule 5  . bictegravir-emtricitabine-tenofovir AF (BIKTARVY) 50-200-25 MG TABS tablet Take 1 tablet by mouth daily. 30 tablet 3  . Dulaglutide (TRULICITY) 1.5 WE/3.1VQ SOPN Inject 0.5 ml subcutaneously weekly. 2 mL 2  . ondansetron (ZOFRAN) 8 MG tablet TAKE 1 TABLET(8 MG) BY MOUTH EVERY 8 HOURS AS NEEDED FOR NAUSEA OR VOMITING 20 tablet 1  . hydrOXYzine (ATARAX/VISTARIL) 50 MG tablet Take 50 mg by mouth daily as needed (sleep).     No facility-administered medications prior to visit.      Past Medical History:  Diagnosis Date  . Depression 04/06/2018  . Diabetes type 1, controlled ([place]) 04/06/2018  . HIV (human immunodeficiency virus infection) ([place]) 04/06/2018  . Hypertension   . Nausea in adult 04/06/2018     Past Surgical History:  Procedure Laterality Date  . Bilaterla mesh for Hernia Bilateral 2009  . CHOLECYSTECTOMY         Review of Systems  Constitutional: Negative for appetite change, chills, fatigue,  fever and unexpected weight change.  Eyes: Negative for visual disturbance.  Respiratory: Negative for cough, chest tightness, shortness of breath and wheezing.   Cardiovascular: Negative for chest pain and leg swelling.  Gastrointestinal: Negative for abdominal pain, constipation, diarrhea, nausea and vomiting.  Genitourinary: Negative for dysuria, flank pain, frequency, genital sores, hematuria and urgency.  Skin: Negative for rash.  Allergic/Immunologic: Negative for immunocompromised state.  Neurological: Negative for dizziness and headaches.       Objective:    BP (!) 130/95   Pulse (!) 103   Temp 98.4 F (36.9 C)   Wt 185 lb (83.9 kg)   BMI 28.98 kg/m  Nursing note and vital signs reviewed.  Physical Exam Constitutional:      General: He is not in acute distress.    Appearance: He is well-developed.  Eyes:     Conjunctiva/sclera: Conjunctivae normal.  Neck:     Musculoskeletal: Neck supple.  Cardiovascular:     Rate and Rhythm: Normal rate and regular rhythm.     Heart sounds: Normal heart sounds. No murmur. No friction rub. No gallop.   Pulmonary:     Effort: Pulmonary effort is normal. No respiratory distress.     Breath sounds: Normal breath sounds. No wheezing or rales.  Chest:     Chest wall: No tenderness.  Abdominal:     General: Bowel sounds are normal.     Palpations: Abdomen is soft.     Tenderness: There is no abdominal tenderness.  Lymphadenopathy:     Cervical: No cervical adenopathy.  Skin:    General: Skin is warm and dry.     Findings: No rash.  Neurological:     Mental Status: He is alert and oriented to person, place, and time.  Psychiatric:        Behavior: Behavior normal.        Thought Content: Thought content normal.        Judgment: Judgment normal.      Depression screen PHQ 2/9 06/02/2018  Decreased Interest 0  Down, Depressed, Hopeless 0  PHQ - 2 Score 0       Assessment & Plan:    Patient Active Problem List   Diagnosis Date Noted  . Type 2 diabetes mellitus with hyperglycemia, without long-term current use of insulin (HCC) 06/02/2018  . Recurrent major depressive disorder, in full remission (HCC) 06/02/2018  . Essential hypertension 06/02/2018  . Healthcare maintenance 06/02/2018  . Human immunodeficiency virus (HIV) disease (HCC) 08/02/2017     Problem List Items Addressed This Visit      Cardiovascular and Mediastinum   Essential hypertension    Blood pressure slightly elevated above goal 130/90 with current medication regimen.  No signs/symptoms of headache  or blurred vision.  Encouraged to monitor blood pressure at home as able.  Continue current dose of hydrochlorothiazide.        Endocrine   Type 2 diabetes mellitus with hyperglycemia, without long-term current use of insulin [facility](HCC)    Adam Chan continues to have elevated A1c levels with most recent of 9.6.  Discussed importance of reducing carbohydrate intake which I think he has done.  May need to consider additional medication classes if blood sugars remain elevated.  Will check A1c and insulin levels in 3 months.  Discussed lifestyle changes.  Continue current dose of metformin, glipizide, and Trulicity.      Relevant Medications   Dulaglutide (TRULICITY) 1.5 MG/0.5ML SOPN   Other Relevant Orders   HgB  A1c   Insulin, Free (Bioactive)     Other   Human immunodeficiency virus (HIV) disease (HCC) - Primary    Adam Chan has a low level viremia of unclear origin as he has good adherence and tolerance to his medication.  He has no signs/symptoms of opportunistic infection or progressive HIV disease at present.  Issues with obtaining medication have been resolved.  Discussed blood work results and plan of care with questions answered.  Continue current dose of Biktarvy.  Plan for follow-up in 3 months or sooner if needed with lab work 1 to 2 weeks prior to appointment.      Relevant Medications   bictegravir-emtricitabine-tenofovir AF (BIKTARVY) 50-200-25 MG TABS tablet   ondansetron (ZOFRAN) 8 MG tablet   Other Relevant Orders   T-helper cell (CD4)- ([facility] clinic only)   HIV-1 RNA quant-no reflex-bld   Comprehensive metabolic panel   MENINGOCOCCAL MCV4O (Completed)   Healthcare maintenance     Menveo updated today.  Discussed importance of safe sexual practice to reduce risk of acquisition/transmission of STI.  Declines condoms.       Other Visit Diagnoses    Screening for STDs (sexually transmitted diseases)       Relevant Orders   RPR   Need for meningococcus vaccine        Relevant Orders   MENINGOCOCCAL MCV4O (Completed)       I have discontinued Adam Chan's hydrOXYzine. I am also having him maintain his albuterol, EPINEPHrine, Crestor, omeprazole, escitalopram, glipiZIDE, metFORMIN, hydrochlorothiazide, Biktarvy, ondansetron, and Trulicity.   Meds ordered this encounter  Medications  . bictegravir-emtricitabine-tenofovir AF (BIKTARVY) 50-200-25 MG TABS tablet    Sig: Take 1 tablet by mouth daily.    Dispense:  30 tablet    Refill:  3    Order Specific Question:   Supervising Provider    Answer:   Judyann Munson [[id number]]  . ondansetron (ZOFRAN) 8 MG tablet    Sig: TAKE 1 TABLET(8 MG) BY MOUTH EVERY 8 HOURS AS NEEDED FOR NAUSEA OR VOMITING    Dispense:  20 tablet    Refill:  1    Order Specific Question:   Supervising Provider    Answer:   Judyann Munson [[id number]]  . Dulaglutide (TRULICITY) 1.5 MG/0.5ML SOPN    Sig: Inject 0.5 ml subcutaneously weekly.    Dispense:  2 mL    Refill:  3    Order Specific Question:   Supervising Provider    Answer:   Judyann Munson [[id number]]     Follow-up: Return in about 3 months (around 06/06/2019).   Marcos Eke, MSN, FNP-C Nurse Practitioner [facility] Main number: [phone]

## 2019-03-06 NOTE — Assessment & Plan Note (Signed)
Adam Chan continues to have elevated A1c levels with most recent of 9.6.  Discussed importance of reducing carbohydrate intake which I think he has done.  May need to consider additional medication classes if blood sugars remain elevated.  Will check A1c and insulin levels in 3 months.  Discussed lifestyle changes.  Continue current dose of metformin, glipizide, and Trulicity.

## 2019-03-06 NOTE — Assessment & Plan Note (Signed)
Adam Chan has a low level viremia of unclear origin as he has good adherence and tolerance to his medication.  He has no signs/symptoms of opportunistic infection or progressive HIV disease at present.  Issues with obtaining medication have been resolved.  Discussed blood work results and plan of care with questions answered.  Continue current dose of Biktarvy.  Plan for follow-up in 3 months or sooner if needed with lab work 1 to 2 weeks prior to appointment.

## 2019-03-06 NOTE — Assessment & Plan Note (Signed)
Blood pressure slightly elevated above goal 130/90 with current medication regimen.  No signs/symptoms of headache or blurred vision.  Encouraged to monitor blood pressure at home as able.  Continue current dose of hydrochlorothiazide.

## 2019-03-15 LAB — HEMOGLOBIN A1C
Hgb A1c MFr Bld: 9.5 % of total Hgb — ABNORMAL HIGH (ref <5.7)
Mean Plasma Glucose: 226 (calc)
eAG (mmol/L): 12.5 (calc)

## 2019-03-15 LAB — INSULIN, FREE (BIOACTIVE): Insulin, Free: 3.5 u[IU]/mL (ref 1.5–14.9)

## 2019-03-17 ENCOUNTER — Encounter (HOSPITAL_COMMUNITY): Payer: Self-pay | Admitting: Emergency Medicine

## 2019-03-17 ENCOUNTER — Emergency Department (HOSPITAL_COMMUNITY)
Admission: EM | Admit: 2019-03-17 | Discharge: 2019-03-17 | Disposition: A | Discharge status: Home / Self Care | Payer: Self-pay | Attending: Emergency Medicine | Admitting: Emergency Medicine

## 2019-03-17 ENCOUNTER — Other Ambulatory Visit: Payer: Self-pay

## 2019-03-17 DIAGNOSIS — Z20822 Contact with and (suspected) exposure to covid-19: Secondary | ICD-10-CM

## 2019-03-17 DIAGNOSIS — R112 Nausea with vomiting, unspecified: Secondary | ICD-10-CM | POA: Insufficient documentation

## 2019-03-17 DIAGNOSIS — R531 Weakness: Secondary | ICD-10-CM | POA: Insufficient documentation

## 2019-03-17 DIAGNOSIS — E119 Type 2 diabetes mellitus without complications: Secondary | ICD-10-CM | POA: Insufficient documentation

## 2019-03-17 DIAGNOSIS — Z20828 Contact with and (suspected) exposure to other viral communicable diseases: Secondary | ICD-10-CM | POA: Insufficient documentation

## 2019-03-17 LAB — COMPREHENSIVE METABOLIC PANEL
ALT: 83 U/L — ABNORMAL HIGH (ref 0–44)
AST: 91 U/L — ABNORMAL HIGH (ref 15–41)
Albumin: 3.6 g/dL (ref 3.5–5.0)
Alkaline Phosphatase: 70 U/L (ref 38–126)
Anion gap: 12 (ref 5–15)
BUN: 13 mg/dL (ref 6–20)
CO2: 23 mmol/L (ref 22–32)
Calcium: 8.9 mg/dL (ref 8.9–10.3)
Chloride: 97 mmol/L — ABNORMAL LOW (ref 98–111)
Creatinine, Ser: 0.82 mg/dL (ref 0.61–1.24)
GFR calc Af Amer: >60 mL/min (ref >60)
GFR calc non Af Amer: >60 mL/min (ref >60)
Glucose, Bld: 291 mg/dL — ABNORMAL HIGH (ref 70–99)
Potassium: 3.3 mmol/L — ABNORMAL LOW (ref 3.5–5.1)
Sodium: 132 mmol/L — ABNORMAL LOW (ref 135–145)
Total Bilirubin: 1.4 mg/dL — ABNORMAL HIGH (ref 0.3–1.2)
Total Protein: 7.1 g/dL (ref 6.5–8.1)

## 2019-03-17 LAB — CBC WITH DIFFERENTIAL/PLATELET
Abs Immature Granulocytes: 0.03 10*3/uL (ref 0.00–0.07)
Basophils Absolute: 0 10*3/uL (ref 0.0–0.1)
Basophils Relative: 1 %
Eosinophils Absolute: 0.1 10*3/uL (ref 0.0–0.5)
Eosinophils Relative: 2 %
HCT: 46 % (ref 39.0–52.0)
Hemoglobin: 16.3 g/dL (ref 13.0–17.0)
Immature Granulocytes: 1 %
Lymphocytes Relative: 49 %
Lymphs Abs: 1.8 10*3/uL (ref 0.7–4.0)
MCH: 33.5 pg (ref 26.0–34.0)
MCHC: 35.4 g/dL (ref 30.0–36.0)
MCV: 94.5 fL (ref 80.0–100.0)
Monocytes Absolute: 0.4 10*3/uL (ref 0.1–1.0)
Monocytes Relative: 10 %
Neutro Abs: 1.3 10*3/uL — ABNORMAL LOW (ref 1.7–7.7)
Neutrophils Relative %: 37 %
Platelets: 158 10*3/uL (ref 150–400)
RBC: 4.87 MIL/uL (ref 4.22–5.81)
RDW: 13.3 % (ref 11.5–15.5)
WBC: 3.6 10*3/uL — ABNORMAL LOW (ref 4.0–10.5)
nRBC: 0 % (ref 0.0–0.2)

## 2019-03-17 LAB — LIPASE, BLOOD: Lipase: 22 U/L (ref 11–51)

## 2019-03-17 LAB — INFLUENZA PANEL BY PCR (TYPE A & B)
Influenza A By PCR: NEGATIVE
Influenza B By PCR: NEGATIVE

## 2019-03-17 MED ORDER — SODIUM CHLORIDE 0.9 % IV BOLUS
1000.0000 mL | Freq: Once | INTRAVENOUS | Status: AC
  Administered 2019-03-17: 1000 mL via INTRAVENOUS

## 2019-03-17 MED ORDER — ACETAMINOPHEN 325 MG PO TABS
650.0000 mg | ORAL_TABLET | Freq: Once | ORAL | Status: AC
  Administered 2019-03-17: 650 mg via ORAL
  Filled 2019-03-17: qty 2

## 2019-03-17 MED ORDER — ONDANSETRON HCL 4 MG/2ML IJ SOLN
4.0000 mg | Freq: Once | INTRAMUSCULAR | Status: AC
  Administered 2019-03-17: 4 mg via INTRAVENOUS
  Filled 2019-03-17: qty 2

## 2019-03-17 NOTE — ED Notes (Signed)
Patient verbalizes understanding of discharge instructions. Opportunity for questioning and answers were provided. Armband removed by staff, pt discharged from ED.  

## 2019-03-17 NOTE — ED Triage Notes (Signed)
C/o chills, generalized weakness, body aches, and fatigue x 2 days.  Reports he was exposed to multiple COVID + people at work.

## 2019-03-17 NOTE — ED Provider Notes (Signed)
Care assumed from PA Albrizze at shift change. See her note for full HPI.  In short, patient presents to ED with chills, generalized weakness, and body aches for the past 2 days. 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 Symptoms associated with non-bloody, non-bilious emesis without abdominal pain. Of note, he has history of poorly controlled diabetes and has not taken his diabetic medication today.   Physical Exam  BP 113/89   Pulse 93   Temp 98.2 F (36.8 C) (Oral)   Resp 20   SpO2 96%   Physical Exam Vitals signs and nursing note reviewed.  Constitutional:      General: He is not in acute distress.    Appearance: He is not toxic-appearing.  HENT:     Head: Normocephalic.  Neck:     Musculoskeletal: Neck supple.  Cardiovascular:     Rate and Rhythm: Normal rate and regular rhythm.     Pulses: Normal pulses.     Heart sounds: Normal heart sounds. No murmur. No friction rub. No gallop.   Pulmonary:     Effort: Pulmonary effort is normal.     Breath sounds: Normal breath sounds.     Comments: Clear to auscultation bilaterally. Abdominal:     General: Abdomen is flat. There is no distension.     Palpations: Abdomen is soft.     Tenderness: There is no abdominal tenderness. There is no guarding or rebound.  Musculoskeletal:     Comments: Able to move all 4 extremities without difficulty.  Skin:    General: Skin is warm and dry.  Neurological:     General: No focal deficit present.     ED Course/Procedures     Procedures  MDM   Upon reassessment patient admits to feeling better. Patient in no acute distress and non-toxic appearing. Soft, non-tender, non-distended abdomen. No peritoneal signs. Lungs clear to auscultation bilaterally. No further episodes of emesis while in the ED. Still do not feel imaging is warranted at this time given patient's reassessment and improvement of symptoms. Abdomen not surgical at this time. Suspect COVID  infection vs. Other viral infection vs. Gastroenteritis.  Influenza negative. CMP significant for hyperglycemia at 291 with no anion gap. Do not suspect DKA at this time. Offered diabetic medication in ED, but patient prefers to go home to take medication. Patient appears to always have elevated glucose in that range. COVID test still pending. CBC with low WBC at 3.6. Advised patient to follow-up with infectious disease Dr. Elna Breslow for further evaluation of WBC and HIV. Through chart review last CD4 count at 32 1 month ago. Patient admits to taking his medication as prescribed.  Patient able to tolerate po while in ED. Discussed with patient the need to quarantine until  COVID results are available. Advised patient on bland diet for the next few days with good oral intake. Patient instructed to follow-up with PCP within the next few days if symptoms do not improve. Strict ED precautions discussed with patient. Patient states understanding and agrees to plan. Patient discharged home in no acute distress and vitals within normal limits.     Romie Levee 03/18/19 1223    Isla Pence, MD 03/18/19 1511

## 2019-03-17 NOTE — ED Provider Notes (Signed)
[facility] EMERGENCY DEPARTMENT Provider Note   CSN: [id number] Arrival date & time: 03/17/19  1539     History   Chief Complaint Chief Complaint  Patient presents with  . Fever  . body aches    HPI Adam Chan is a 48 y.o. male with past medical history significant for type 1 diabetes, HIV, depression presents to emergency department today with chief complaint of chills, generalized weakness, generalized body aches and fatigue x2 days.  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  Patient states he himself was tested for Covid several months ago and tested positive.  He was asymptomatic at that time and was tested a week later and the test was negative.  He is concerned about a possible false positive test.   He is also reporting 4 episodes of nonbloody nonbilious emesis since symptom onset.  He denies any associated abdominal pain.  He has not taken any medications for symptoms prior to arrival.  He states he not take his blood sugar today or yesterday because he was feeling so badly.  He is emesis time laying in bed.    He denies fever, cough, congestion, sore throat, chest pain, abdominal pain, urinary symptoms, diarrhea.  CD4 count 1 month ago was 32. Followed by infectious disease Dr. Elna Breslow.  Past Medical History:  Diagnosis Date  . Depression 04/06/2018  . Diabetes type 1, controlled ([place]) 04/06/2018  . HIV (human immunodeficiency virus infection) ([place]) 04/06/2018  . Hypertension   . Nausea in adult 04/06/2018    Patient Active Problem List   Diagnosis Date Noted  . Type 2 diabetes mellitus with hyperglycemia, without long-term current use of insulin ([place]) 06/02/2018  . Recurrent major depressive disorder, in full remission ([place]) 06/02/2018  . Essential hypertension 06/02/2018  . Healthcare maintenance 06/02/2018  . Human immunodeficiency virus (HIV) disease ([place]) 08/02/2017    Past Surgical History:  Procedure Laterality Date  . Bilaterla mesh for Hernia Bilateral 2009  . CHOLECYSTECTOMY          Home Medications    Prior to Admission medications   Medication Sig Start Date End Date Taking? Authorizing Provider  albuterol (PROVENTIL HFA;VENTOLIN HFA) 108 (90 Base) MCG/ACT inhaler Inhale 2 puffs into the lungs every 6 (six) hours as needed for wheezing or shortness of breath.    [provider]  bictegravir-emtricitabine-tenofovir AF (BIKTARVY) 50-200-25 MG TABS tablet Take 1 tablet by mouth daily. 03/06/19   Golden Circle, FNP  CRESTOR 40 MG tablet TAKE 1 TABLET(40 MG) BY MOUTH DAILY 11/07/18   Golden Circle, FNP  Dulaglutide (TRULICITY) 1.5 PX/1.0GY SOPN Inject 0.5 ml subcutaneously weekly. 03/06/19   Golden Circle, FNP  EPINEPHrine 0.3 mg/0.3 mL IJ SOAJ injection Inject 0.3 mg into the muscle once.    [provider]  escitalopram (LEXAPRO) 20 MG tablet TAKE 1 TABLET(20 MG) BY MOUTH DAILY 02/07/19   Golden Circle, FNP  glipiZIDE (GLUCOTROL) 10 MG tablet TAKE 1 TABLET(10 MG) BY MOUTH TWICE DAILY BEFORE A MEAL 02/07/19   Golden Circle, FNP  hydrochlorothiazide (HYDRODIURIL) 25 MG tablet TAKE 1 TABLET(25 MG) BY MOUTH DAILY 02/12/19   Golden Circle, FNP  metFORMIN (GLUCOPHAGE) 1000 MG tablet TAKE 1 TABLET(1000 MG) BY MOUTH TWICE DAILY WITH A MEAL 02/07/19   Golden Circle, FNP  omeprazole (PRILOSEC) 20 MG capsule Take 1 capsule (20 mg total) by mouth daily. 12/04/18   Calone,  Tama HeadingsGregory D, FNP  ondansetron (ZOFRAN) 8 MG tablet TAKE 1 TABLET(8 MG) BY MOUTH EVERY 8 HOURS AS NEEDED FOR NAUSEA OR VOMITING 03/06/19   Veryl Speakalone, Gregory D, FNP    Family History Family History  Problem Relation Age of Onset  . Diabetes Mother   . Hypertension Mother   . Heart disease Mother   . Hyperlipidemia Mother   . Diabetes Father   . Hypertension Father   . Hyperlipidemia Father     Social History Social History   Tobacco Use  .  Smoking status: Former Smoker    Packs/day: 0.50    Types: Cigarettes  . Smokeless tobacco: Never Used  Substance Use Topics  . Alcohol use: Yes    Comment: Social drinker, here and there  . Drug use: Yes    Types: Marijuana    Comment: Uses for Nausea per patient stated     Allergies   Sulfa antibiotics   Review of Systems Review of Systems  Constitutional: Positive for chills and fatigue. Negative for fever.  HENT: Negative for congestion, rhinorrhea, sinus pressure and sore throat.   Eyes: Negative for pain and redness.  Respiratory: Negative for cough, shortness of breath and wheezing.   Cardiovascular: Negative for chest pain and palpitations.  Gastrointestinal: Positive for vomiting. Negative for abdominal pain, constipation, diarrhea and nausea.  Genitourinary: Negative for dysuria.  Musculoskeletal: Negative for arthralgias, back pain, myalgias and neck pain.  Skin: Negative for rash and wound.  Allergic/Immunologic: Positive for immunocompromised state.  Neurological: Positive for weakness. Negative for dizziness, syncope, numbness and headaches.  Psychiatric/Behavioral: Negative for confusion.     Physical Exam Updated Vital Signs BP 113/89   Pulse 93   Temp 98.2 F (36.8 C) (Oral)   Resp 20   SpO2 96%   Physical Exam Vitals signs and nursing note reviewed.  Constitutional:      General: He is not in acute distress.    Appearance: He is not ill-appearing.  HENT:     Head: Normocephalic and atraumatic.     Right Ear: Tympanic membrane and external ear normal.     Left Ear: Tympanic membrane and external ear normal.     Nose: Nose normal.     Mouth/Throat:     Mouth: Mucous membranes are moist.     Pharynx: Oropharynx is clear.  Eyes:     General: No scleral icterus.       Right eye: No discharge.        Left eye: No discharge.     Extraocular Movements: Extraocular movements intact.     Conjunctiva/sclera: Conjunctivae normal.     Pupils: Pupils  are equal, round, and reactive to light.  Neck:     Musculoskeletal: Normal range of motion.     Vascular: No JVD.  Cardiovascular:     Rate and Rhythm: Normal rate and regular rhythm.     Pulses: Normal pulses.          Radial pulses are 2+ on the right side and 2+ on the left side.     Heart sounds: Normal heart sounds.  Pulmonary:     Comments: Lungs clear to auscultation in all fields. Symmetric chest rise. No wheezing, rales, or rhonchi. Abdominal:     Tenderness: There is no right CVA tenderness or left CVA tenderness.     Comments: Abdomen is soft, non-distended, and non-tender in all quadrants. No rigidity, no guarding. No peritoneal signs.  Musculoskeletal: Normal range of motion.  Skin:    General: Skin is warm and dry.     Capillary Refill: Capillary refill takes less than 2 seconds.  Neurological:     Mental Status: He is oriented to person, place, and time.     GCS: GCS eye subscore is 4. GCS verbal subscore is 5. GCS motor subscore is 6.     Comments: Fluent speech, no facial droop.  Psychiatric:        Behavior: Behavior normal.      ED Treatments / Results  Labs (all labs ordered are listed, but only abnormal results are displayed) Labs Reviewed  SARS CORONAVIRUS 2 (TAT 6-24 HRS)  INFLUENZA PANEL BY PCR (TYPE A & B)  COMPREHENSIVE METABOLIC PANEL  LIPASE, BLOOD  CBC WITH DIFFERENTIAL/PLATELET    EKG None  Radiology No results found.  Procedures Procedures (including critical care time)  Medications Ordered in ED Medications  sodium chloride 0.9 % bolus 1,000 mL (1,000 mLs Intravenous New Bag/Given 03/17/19 1852)  ondansetron (ZOFRAN) injection 4 mg (4 mg Intravenous Given 03/17/19 1852)  acetaminophen (TYLENOL) tablet 650 mg (650 mg Oral Given 03/17/19 1854)     Initial Impression / Assessment and Plan / ED Course  I have reviewed the triage vital signs and the nursing notes.  Pertinent labs & imaging results that were available during my care  of the patient were reviewed by me and considered in my medical decision making (see chart for details).   Patient seen and examined. Patient nontoxic appearing, in no acute distress.  On arrival he was afebrile, tachycardic to 108, no hypoxia.  His lungs are clear to auscultation in all fields.  Normal work of breathing.  Abdomen is nontender to palpation, no peritoneal signs. During my exam it appears tachycardia has resolved, heart rate is consistently in the 90s. Given his history, decreased p.o. intake and emesis will check labs, give IV fluids and Zofran.  Plan to reassess.  Will test for flu and also for Covid with send out test.  Patient aware the Covid test will not come back today and he will need to self quarantine at home until he has the result. Also discussed imaging with patient with shared decision making, given he has no respiratory symptoms and a nontender abdomen will hold off at this time. If symptoms worsen or repeat exam is concerning we will proceed with possible chest x-ray or CT abdomen pelvis.  Patient care transferred to C. Cheek PA-C at the end of my shift pending labs and influenza swab. Patient presentation, ED course, and plan of care discussed with review of all pertinent labs and imaging. Please see her note for further details regarding further ED course and disposition.  Anticipate discharge home if labs are unremarkable and patient is tolerating p.o. intake.    Newel Oien was evaluated in Emergency Department on 03/17/2019 for the symptoms described in the history of present illness. He was evaluated in the context of the global COVID-19 pandemic, which necessitated consideration that the patient might be at risk for infection with the SARS-CoV-2 virus that causes COVID-19. Institutional protocols and algorithms that pertain to the evaluation of patients at risk for COVID-19 are in a state of rapid change based on information released by regulatory bodies including  the CDC and federal and state organizations. These policies and algorithms were followed during the patient's care in the ED.   Portions of this note were generated with Scientist, clinical (histocompatibility and immunogenetics). Dictation errors may occur despite best attempts  at proofreading.   Final Clinical Impressions(s) / ED Diagnoses   Final diagnoses:  None    ED Discharge Orders    None       Sherene Sires, PA-C 03/17/19 1927    Pricilla Loveless, MD 03/18/19 1500

## 2019-03-17 NOTE — Discharge Instructions (Addendum)
As discussed, you will get your COVID results in the next 24 hours. Continue to quarantine until your results come back. Make sure to take your diabetic medication when you get home because your glucose levels were elevated. I have included a paper on a bland diet which I recommend for the next 24 hours. Continue drinking a lot of fluids. Follow up with your PCP next week if symptoms do not improve. Return to the ER for new or worsening symptoms.

## 2019-03-19 LAB — NOVEL CORONAVIRUS, NAA (HOSP ORDER, SEND-OUT TO REF LAB; TAT 18-24 HRS): SARS-CoV-2, NAA: NOT DETECTED

## 2019-03-20 ENCOUNTER — Telehealth (HOSPITAL_COMMUNITY): Payer: Self-pay

## 2019-03-21 ENCOUNTER — Telehealth: Payer: Self-pay | Admitting: Pharmacy Technician

## 2019-03-21 NOTE — Telephone Encounter (Signed)
[facility] Patient Advocate Encounter  Patient has been approved to receive Trulicity from [facility]. The medication arrived yesterday to the clinic. I left a voicemail for the patient so he was aware to come and pick up the medication. Currently being stored securely in the medication refrigerator.

## 2019-03-21 NOTE — Telephone Encounter (Signed)
Patient picked up 3 boxes of Trulicity at the time of his dental appointment.

## 2019-05-06 ENCOUNTER — Other Ambulatory Visit: Payer: Self-pay | Admitting: Family

## 2019-05-06 DIAGNOSIS — F3342 Major depressive disorder, recurrent, in full remission: Secondary | ICD-10-CM

## 2019-05-06 DIAGNOSIS — E1165 Type 2 diabetes mellitus with hyperglycemia: Secondary | ICD-10-CM

## 2019-05-23 ENCOUNTER — Other Ambulatory Visit: Payer: Self-pay

## 2019-05-28 ENCOUNTER — Telehealth: Payer: Self-pay

## 2019-05-28 NOTE — Telephone Encounter (Signed)
COVID-19 Pre-Screening Questions:05/28/19  Do you currently have a fever (>100 F), chills or unexplained body aches? NO   . Are you currently experiencing new cough, shortness of breath, sore throat, runny nose? NO .  Have you recently travelled outside the state of [place] in the last 14 days? NO .  Have you been in contact with someone that is currently pending confirmation of Covid19 testing or has been confirmed to have the Covid19 virus?  NO  **If the patient answers NO to ALL questions -  advise the patient to please call the clinic before coming to the office should any symptoms develop.     

## 2019-05-29 ENCOUNTER — Other Ambulatory Visit: Payer: Self-pay | Admitting: *Deleted

## 2019-05-29 ENCOUNTER — Other Ambulatory Visit: Payer: Self-pay

## 2019-05-29 DIAGNOSIS — B20 Human immunodeficiency virus [HIV] disease: Secondary | ICD-10-CM

## 2019-05-29 DIAGNOSIS — Z113 Encounter for screening for infections with a predominantly sexual mode of transmission: Secondary | ICD-10-CM

## 2019-05-29 DIAGNOSIS — E1165 Type 2 diabetes mellitus with hyperglycemia: Secondary | ICD-10-CM

## 2019-05-29 MED ORDER — TRULICITY 1.5 MG/0.5ML ~~LOC~~ SOAJ: SUBCUTANEOUS | 3 refills | Status: DC

## 2019-05-29 NOTE — Telephone Encounter (Signed)
We can fax the script to [facility] and get him more medication. Last time they shipped to the clinic and he received it during dental clinic.

## 2019-05-29 NOTE — Telephone Encounter (Signed)
Prescription is in [employer] for signature.

## 2019-05-30 ENCOUNTER — Telehealth: Payer: Self-pay | Admitting: *Deleted

## 2019-05-30 DIAGNOSIS — E1165 Type 2 diabetes mellitus with hyperglycemia: Secondary | ICD-10-CM

## 2019-05-30 LAB — T-HELPER CELL (CD4) - (RCID CLINIC ONLY)
CD4 % Helper T Cell: 37 % (ref 33–65)
CD4 T Cell Abs: 601 /uL (ref 400–1790)

## 2019-05-30 NOTE — Telephone Encounter (Signed)
Adam Chan at [facility] calling to report abnormal lab value of glucose = 421 1/19. RN reached out to patient, call went to voicemail. RN asked patient to please call back regarding medications and lab results. Please advise.  His RPR also returned abnormal. Andree Coss, RN

## 2019-05-30 NOTE — Telephone Encounter (Signed)
Spoke with patient. He denies missing medication, states he ate normally (eggs, bread, coffee) before his lab draw.  Per Tammy Sours, RN connected patient to Internal Medicine and [facility] for primary care and diabetic management.  Patient will call [phone] to make an appointment.  He will need to speak with them regarding financial assistance. Andree Coss, RN

## 2019-05-30 NOTE — Addendum Note (Signed)
Addended by: Andree Coss on: 05/30/2019 03:44 PM   Modules accepted: Orders

## 2019-05-30 NOTE — Telephone Encounter (Signed)
Thank you! I'll fax it once we get it signed

## 2019-05-31 ENCOUNTER — Other Ambulatory Visit: Payer: Self-pay | Admitting: Family

## 2019-05-31 DIAGNOSIS — B20 Human immunodeficiency virus [HIV] disease: Secondary | ICD-10-CM

## 2019-05-31 DIAGNOSIS — K219 Gastro-esophageal reflux disease without esophagitis: Secondary | ICD-10-CM

## 2019-06-01 ENCOUNTER — Other Ambulatory Visit: Payer: Self-pay | Admitting: Family

## 2019-06-01 ENCOUNTER — Telehealth: Payer: Self-pay

## 2019-06-01 DIAGNOSIS — E1165 Type 2 diabetes mellitus with hyperglycemia: Secondary | ICD-10-CM

## 2019-06-01 NOTE — Progress Notes (Signed)
Referral placed.

## 2019-06-01 NOTE — Telephone Encounter (Signed)
Patient has reactive RPR.  RPR Titer 1:4High     Will forward message to FNP for orders. Lorenso Courier, [place]

## 2019-06-02 LAB — COMPREHENSIVE METABOLIC PANEL
AG Ratio: 1.5 (calc) (ref 1.0–2.5)
ALT: 101 U/L — ABNORMAL HIGH (ref 9–46)
AST: 114 U/L — ABNORMAL HIGH (ref 10–35)
Albumin: 4.1 g/dL (ref 3.6–5.1)
Alkaline phosphatase (APISO): 155 U/L — ABNORMAL HIGH (ref 35–144)
BUN: 8 mg/dL (ref 7–25)
CO2: 27 mmol/L (ref 20–32)
Calcium: 9.1 mg/dL (ref 8.6–10.3)
Chloride: 99 mmol/L (ref 98–110)
Creat: 0.75 mg/dL (ref 0.70–1.33)
Globulin: 2.8 g/dL (calc) (ref 1.9–3.7)
Glucose, Bld: 421 mg/dL — ABNORMAL HIGH (ref 65–99)
Potassium: 4 mmol/L (ref 3.5–5.3)
Sodium: 135 mmol/L (ref 135–146)
Total Bilirubin: 0.9 mg/dL (ref 0.2–1.2)
Total Protein: 6.9 g/dL (ref 6.1–8.1)

## 2019-06-02 LAB — FLUORESCENT TREPONEMAL AB(FTA)-IGG-BLD: Fluorescent Treponemal ABS: REACTIVE — AB

## 2019-06-02 LAB — RPR: RPR Ser Ql: REACTIVE — AB

## 2019-06-02 LAB — RPR TITER: RPR Titer: 1:4 {titer} — ABNORMAL HIGH

## 2019-06-02 LAB — HIV-1 RNA QUANT-NO REFLEX-BLD
HIV 1 RNA Quant: 185 copies/mL — ABNORMAL HIGH
HIV-1 RNA Quant, Log: 2.27 Log copies/mL — ABNORMAL HIGH

## 2019-06-04 ENCOUNTER — Other Ambulatory Visit: Payer: Self-pay

## 2019-06-04 ENCOUNTER — Ambulatory Visit (INDEPENDENT_AMBULATORY_CARE_PROVIDER_SITE_OTHER): Payer: Self-pay

## 2019-06-04 ENCOUNTER — Telehealth: Payer: Self-pay | Admitting: Pharmacy Technician

## 2019-06-04 DIAGNOSIS — A64 Unspecified sexually transmitted disease: Secondary | ICD-10-CM

## 2019-06-04 MED ORDER — PENICILLIN G BENZATHINE 1200000 UNIT/2ML IM SUSP
1.2000 10*6.[IU] | Freq: Once | INTRAMUSCULAR | Status: AC
  Administered 2019-06-04: 14:00:00 1.2 10*6.[IU] via INTRAMUSCULAR

## 2019-06-04 NOTE — Telephone Encounter (Signed)
Please inform [name] that his RPR testing indicates that he is positive for syphilis. 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 Thanks!

## 2019-06-04 NOTE — Telephone Encounter (Addendum)
[facility] Patient Advocate Encounter  New script has been faxed to the [facility] for the patient to get a refill of his Trulicity. Faxed to [phone]. The company has been mailing the medication to the clinic but the patient now has a reliable address for the shipments.   [facility] required more information for [employer]. This has been signed by the provider and faxed back 01/26 to [phone]

## 2019-06-04 NOTE — Telephone Encounter (Signed)
Spoke with patient regarding STD test. Patient was instructed to refrain from sexual activity for two weeks for infection to be cleared. Understands he must inform recent partners to get tested/treated at local health department.  Adam Chan, [place]

## 2019-06-04 NOTE — Telephone Encounter (Signed)
Patient called states he would like to refuse referral for Diabetic and Nutrition beacue he is certain his blood sugar level were elevated due to receiving  covid vaccine. Patient states he know how to maintain his blood sugars and would not accept any additional advise from LPN [facility]

## 2019-06-06 ENCOUNTER — Encounter: Payer: Self-pay | Admitting: Family

## 2019-06-08 NOTE — Telephone Encounter (Signed)
[facility] Patient Advocate Encounter  Patient has appointment on 02/03 so I've been trying since Wednesday to speed the process up with [employer] and [employer]. Despite multiple attempts, [facility] states they still do not have enough information. I called [facility] this morning and they show that the patient is enrolled until 06/15/2019. A new script was faxed to [facility] as well as their fax request for more information. Lily transferred me to the pharmacist this morning and he said we did not have a prescribed quantity or number of refills. I read to him the number printed on the script, 2 ml (0.5 ml weekly for a box of 4 pens). He said he will rush this back to [place] to schedule the shipment but that wouldn't happen until Monday 02/01. I will update the patient.

## 2019-06-12 ENCOUNTER — Telehealth: Payer: Self-pay

## 2019-06-12 NOTE — Telephone Encounter (Signed)
COVID-19 Pre-Screening Questions:06/12/19  Do you currently have a fever (>100 F), chills or unexplained body aches? NO   Are you currently experiencing new cough, shortness of breath, sore throat, runny nose? *NO .  Have you recently travelled outside the state of [place] in the last 14 days?NO  .  Have you been in contact with someone that is currently pending confirmation of Covid19 testing or has been confirmed to have the Covid19 virus?  NO   **If the patient answers NO to ALL questions -  advise the patient to please call the clinic before coming to the office should any symptoms develop.     

## 2019-06-13 ENCOUNTER — Other Ambulatory Visit: Payer: Self-pay

## 2019-06-13 ENCOUNTER — Ambulatory Visit (INDEPENDENT_AMBULATORY_CARE_PROVIDER_SITE_OTHER): Payer: Self-pay | Admitting: Family

## 2019-06-13 ENCOUNTER — Encounter: Payer: Self-pay | Admitting: Family

## 2019-06-13 VITALS — BP 142/99 | HR 99 | Wt 178.0 lb

## 2019-06-13 DIAGNOSIS — Z0000 Encounter for general adult medical examination without abnormal findings: Secondary | ICD-10-CM

## 2019-06-13 DIAGNOSIS — B20 Human immunodeficiency virus [HIV] disease: Secondary | ICD-10-CM

## 2019-06-13 DIAGNOSIS — A515 Early syphilis, latent: Secondary | ICD-10-CM | POA: Insufficient documentation

## 2019-06-13 DIAGNOSIS — E1165 Type 2 diabetes mellitus with hyperglycemia: Secondary | ICD-10-CM

## 2019-06-13 MED ORDER — BIKTARVY 50-200-25 MG PO TABS: 1.0000 | ORAL_TABLET | Freq: Every day | ORAL | 3 refills | Status: DC

## 2019-06-13 MED ORDER — CANAGLIFLOZIN 100 MG PO TABS: 100.0000 mg | ORAL_TABLET | Freq: Every day | ORAL | 2 refills | Status: DC

## 2019-06-13 NOTE — Assessment & Plan Note (Signed)
Most recent blood work with positive syphilis titer of 1: 4 changed from nonreactive with no current symptoms consistent with early latent syphilis.  He was treated on 06/04/2019 with 2,400,000 units of Bicillin IM once.  Continue to monitor RPR over the next 3 to 6 months for care.

## 2019-06-13 NOTE — Telephone Encounter (Signed)
[facility] Patient Advocate Encounter  The medication Trulicity arrived to clinic and was put in the refrigerator.  Note was added to appointment for this afternoon at 1:45 that he needs to pick up before leaving.  I tried calling twice to let him know, but no answer.  Netty Starring. Dimas Aguas CPhT [facility] Patient [facility] Phone: [phone] Fax:  [phone]

## 2019-06-13 NOTE — Assessment & Plan Note (Signed)
   Hold vaccinations today with pending second dose of coronavirus vaccination.  Discussed importance of safe sexual practice to reduce risk of STI.  Condoms declined.

## 2019-06-13 NOTE — Progress Notes (Signed)
Subjective:    Patient ID: Adam Chan, male    DOB: 09-17-1970, 49 y.o.   MRN: [id number]  Chief Complaint  Patient presents with  . Follow-up    no complaints; received 1st of 2 COVID vaccine; recieved flu shot in fall; declined condoms; will provide patient with trulicity that was delivered to office     HPI:  Adam Chan is a 49 y.o. male with HIV disease and poorly controlled diabetes who was last seen in the office on 03/06/2019 for routine follow-up with good adherence and tolerance to his ART regimen of Biktarvy.  Viral load at the time was found to be 85 with CD4 count of 1030.  Most recent blood work on 05/29/2019 with CD4 count of 601 and viral load of 185. Random blood sugar was 421. He has declined referral Diabetes Education.   Adam Chan continues to take his Biktarvy as prescribed with no adverse side effects or missed doses since his last office visit.  Overall feeling well today.  He recently received his first round of vaccination for Covid about 3 weeks ago and is due for his next dose in a week.  He did have adverse side effects of fever and chills.  He has no symptoms today. Denies fevers, chills, night sweats, headaches, changes in vision, neck pain/stiffness, nausea, diarrhea, vomiting, lesions or rashes.  Adam Chan has no problems obtaining his medication from the pharmacy and remains covered through [employer] which he needs to renew today.  Denies any feelings of being down, depressed, or hopeless recently.  No recreational or illicit drug use, tobacco use, or alcohol consumption.  He is working on adjusting his nutritional intake to help improve his blood sugar levels.  Believes the elevated glucose reading of 421 with most recent blood work was related to his coronavirus vaccination.  He has continued to take his diabetes medication as prescribed.  He received treatment for syphilis with 2,400,000 units of Bicillin once last week.     Allergies    Allergen Reactions  . Sulfa Antibiotics Anaphylaxis  . Sulfur Other (See Comments)      Outpatient Medications Prior to Visit  Medication Sig Dispense Refill  . bictegravir-emtricitabine-tenofovir AF (BIKTARVY) 50-200-25 MG TABS tablet Take 1 tablet by mouth daily. 30 tablet 3  . albuterol (PROVENTIL HFA;VENTOLIN HFA) 108 (90 Base) MCG/ACT inhaler Inhale 2 puffs into the lungs every 6 (six) hours as needed for wheezing or shortness of breath.    . CRESTOR 40 MG tablet TAKE 1 TABLET(40 MG) BY MOUTH DAILY 30 tablet 5  . Dulaglutide (TRULICITY) 1.5 ID/7.8EU SOPN Inject 0.5 ml subcutaneously weekly. 2 mL 3  . EPINEPHrine 0.3 mg/0.3 mL IJ SOAJ injection Inject 0.3 mg into the muscle once.    . escitalopram (LEXAPRO) 20 MG tablet TAKE 1 TABLET(20 MG) BY MOUTH DAILY 30 tablet 1  . hydrochlorothiazide (HYDRODIURIL) 25 MG tablet TAKE 1 TABLET(25 MG) BY MOUTH DAILY 30 tablet 2  . metFORMIN (GLUCOPHAGE) 1000 MG tablet TAKE 1 TABLET(1000 MG) BY MOUTH TWICE DAILY WITH A MEAL 60 tablet 1  . omeprazole (PRILOSEC) 20 MG capsule TAKE 1 CAPSULE(20 MG) BY MOUTH DAILY 30 capsule 5  . ondansetron (ZOFRAN) 8 MG tablet TAKE 1 TABLET(8 MG) BY MOUTH EVERY 8 HOURS AS NEEDED FOR NAUSEA OR VOMITING 20 tablet 1  . glipiZIDE (GLUCOTROL) 10 MG tablet TAKE 1 TABLET(10 MG) BY MOUTH TWICE DAILY BEFORE A MEAL 30 tablet 1   No facility-administered medications prior to visit.  Past Medical History:  Diagnosis Date  . Depression 04/06/2018  . Diabetes type 1, controlled (HCC) 04/06/2018  . HIV (human immunodeficiency virus infection) (HCC) 04/06/2018  . Hypertension   . Nausea in adult 04/06/2018     Past Surgical History:  Procedure Laterality Date  . Bilaterla mesh for Hernia Bilateral 2009  . CHOLECYSTECTOMY      Review of Systems  Constitutional: Negative for appetite change, chills, fatigue, fever and unexpected weight change.  Eyes: Negative for visual disturbance.  Respiratory: Negative for cough,  chest tightness, shortness of breath and wheezing.   Cardiovascular: Negative for chest pain and leg swelling.  Gastrointestinal: Negative for abdominal pain, constipation, diarrhea, nausea and vomiting.  Genitourinary: Negative for dysuria, flank pain, frequency, genital sores, hematuria and urgency.  Skin: Negative for rash.  Allergic/Immunologic: Negative for immunocompromised state.  Neurological: Negative for dizziness and headaches.      Objective:    BP (!) 142/99   Pulse 99   Wt 178 lb (80.7 kg)   BMI 27.88 kg/m  Nursing note and vital signs reviewed.  Physical Exam Constitutional:      General: He is not in acute distress.    Appearance: He is well-developed.  Eyes:     Conjunctiva/sclera: Conjunctivae normal.  Cardiovascular:     Rate and Rhythm: Normal rate and regular rhythm.     Heart sounds: Normal heart sounds. No murmur. No friction rub. No gallop.   Pulmonary:     Effort: Pulmonary effort is normal. No respiratory distress.     Breath sounds: Normal breath sounds. No wheezing or rales.  Chest:     Chest wall: No tenderness.  Abdominal:     General: Bowel sounds are normal.     Palpations: Abdomen is soft.     Tenderness: There is no abdominal tenderness.  Musculoskeletal:     Cervical back: Neck supple.  Lymphadenopathy:     Cervical: No cervical adenopathy.  Skin:    General: Skin is warm and dry.     Findings: No rash.  Neurological:     Mental Status: He is alert and oriented to person, place, and time.  Psychiatric:        Behavior: Behavior normal.        Thought Content: Thought content normal.        Judgment: Judgment normal.      Depression screen [facility] 2/9 06/13/2019 06/02/2018  Decreased Interest 0 0  Down, Depressed, Hopeless 0 0  PHQ - 2 Score 0 0       Assessment & Plan:    Patient Active Problem List   Diagnosis Date Noted  . Early syphilis, latent 06/13/2019  . Type 2 diabetes mellitus with hyperglycemia, without long-term  current use of insulin (HCC) 06/02/2018  . Recurrent major depressive disorder, in full remission (HCC) 06/02/2018  . Essential hypertension 06/02/2018  . Healthcare maintenance 06/02/2018  . Human immunodeficiency virus (HIV) disease (HCC) 08/02/2017     Problem List Items Addressed This Visit      Endocrine   Type 2 diabetes mellitus with hyperglycemia, without long-term current use of insulin [facility])    Adam Chan is working on nutritional changes to help better control his type 2 diabetes which currently is poorly controlled with most recent random blood glucose of 421.  Discontinue glipizide.  Start Invokana.  Continue current dose of Metformin and Trulicity.  Encouraged to monitor blood sugars at home and decrease carbohydrate and processed/sugary food intake.  He declined referral to nutritional counseling.      Relevant Medications   canagliflozin (INVOKANA) 100 MG TABS tablet     Other   Human immunodeficiency virus (HIV) disease (HCC) - Primary    Adam Chan has low level viremia less than 200 with good adherence and tolerance to his ART regimen of Biktarvy.  No signs/symptoms of opportunistic infection or progressive HIV disease.  We will continue to monitor his low level viremia which may be attributable to his Covid vaccination although unlikely.  We reviewed his lab work and discussed the plan of care with time for questions.  Continue current dose of Biktarvy.  Plan for follow-up in 3 months or sooner if needed with lab work 1 to 2 weeks prior to appointment.      Relevant Medications   bictegravir-emtricitabine-tenofovir AF (BIKTARVY) 50-200-25 MG TABS tablet   Healthcare maintenance     Hold vaccinations today with pending second dose of coronavirus vaccination.  Discussed importance of safe sexual practice to reduce risk of STI.  Condoms declined.      Early syphilis, latent    Most recent blood work with positive syphilis titer of 1: 4 changed from nonreactive  with no current symptoms consistent with early latent syphilis.  He was treated on 06/04/2019 with 2,400,000 units of Bicillin IM once.  Continue to monitor RPR over the next 3 to 6 months for care.      Relevant Medications   bictegravir-emtricitabine-tenofovir AF (BIKTARVY) 50-200-25 MG TABS tablet       I have discontinued Adam Chan's glipiZIDE. I am also having him start on canagliflozin. Additionally, I am having him maintain his albuterol, EPINEPHrine, Crestor, metFORMIN, escitalopram, Trulicity, hydrochlorothiazide, ondansetron, omeprazole, and Biktarvy.   Meds ordered this encounter  Medications  . canagliflozin (INVOKANA) 100 MG TABS tablet    Sig: Take 1 tablet (100 mg total) by mouth daily before breakfast.    Dispense:  30 tablet    Refill:  2    Discontinue Glipizide    Order Specific Question:   Supervising Provider    Answer:   Judyann Munson [[id number]]  . bictegravir-emtricitabine-tenofovir AF (BIKTARVY) 50-200-25 MG TABS tablet    Sig: Take 1 tablet by mouth daily.    Dispense:  30 tablet    Refill:  3    Order Specific Question:   Supervising Provider    Answer:   Judyann Munson [[id number]]     Follow-up: Return in about 3 months (around 09/10/2019), or if symptoms worsen or fail to improve.   Marcos Eke, MSN, FNP-C Nurse Practitioner [facility] for Infectious Disease [facility] Main number: [phone]

## 2019-06-13 NOTE — Assessment & Plan Note (Signed)
Adam Chan has low level viremia less than 200 with good adherence and tolerance to his ART regimen of Biktarvy.  No signs/symptoms of opportunistic infection or progressive HIV disease.  We will continue to monitor his low level viremia which may be attributable to his Covid vaccination although unlikely.  We reviewed his lab work and discussed the plan of care with time for questions.  Continue current dose of Biktarvy.  Plan for follow-up in 3 months or sooner if needed with lab work 1 to 2 weeks prior to appointment.

## 2019-06-13 NOTE — Patient Instructions (Addendum)
Nice to see you.  Continue to take Biktarvy daily.  STOP taking glipizide and start taking Invokana.  Continue to take Trulicity.  Plan for follow up in 3 months or sooner if needed.   Have a great day and stay safe!

## 2019-06-13 NOTE — Assessment & Plan Note (Signed)
Adam Chan is working on nutritional changes to help better control his type 2 diabetes which currently is poorly controlled with most recent random blood glucose of 421.  Discontinue glipizide.  Start Invokana.  Continue current dose of Metformin and Trulicity.  Encouraged to monitor blood sugars at home and decrease carbohydrate and processed/sugary food intake.  He declined referral to nutritional counseling.

## 2019-06-20 ENCOUNTER — Encounter: Payer: Self-pay | Admitting: Family

## 2019-07-01 ENCOUNTER — Other Ambulatory Visit: Payer: Self-pay | Admitting: Family

## 2019-07-01 DIAGNOSIS — E1165 Type 2 diabetes mellitus with hyperglycemia: Secondary | ICD-10-CM

## 2019-07-01 DIAGNOSIS — F3342 Major depressive disorder, recurrent, in full remission: Secondary | ICD-10-CM

## 2019-07-12 ENCOUNTER — Other Ambulatory Visit: Payer: Self-pay

## 2019-07-12 ENCOUNTER — Ambulatory Visit (INDEPENDENT_AMBULATORY_CARE_PROVIDER_SITE_OTHER): Payer: Self-pay | Admitting: Family

## 2019-07-12 ENCOUNTER — Encounter: Payer: Self-pay | Admitting: Family

## 2019-07-12 DIAGNOSIS — J029 Acute pharyngitis, unspecified: Secondary | ICD-10-CM | POA: Insufficient documentation

## 2019-07-12 MED ORDER — AMOXICILLIN-POT CLAVULANATE 875-125 MG PO TABS: 1.0000 | ORAL_TABLET | Freq: Two times a day (BID) | ORAL | 0 refills | Status: DC

## 2019-07-12 NOTE — Patient Instructions (Signed)
Nice to see you.  Augmentin has been sent to the pharmacy for 7 days.   Continue over the counter medication as needed for symptom relief.  Plan for follow up as needed.   General Recommendations:    Please drink plenty of fluids.  Get plenty of rest   Sleep in humidified air  Use saline nasal sprays  Netti pot   OTC Medications:  Decongestants - helps relieve congestion   Flonase (generic fluticasone) or Nasacort (generic triamcinolone) - please make sure to use the "cross-over" technique at a 45 degree angle towards the opposite eye as opposed to straight up the nasal passageway.   Sudafed (generic pseudoephedrine - Note this is the one that is available behind the pharmacy counter); Products with phenylephrine (-PE) may also be used but is often not as effective as pseudoephedrine.   If you have HIGH BLOOD PRESSURE - Coricidin HBP; AVOID any product that is -D as this contains pseudoephedrine which may increase your blood pressure.  Afrin (oxymetazoline) every 6-8 hours for up to 3 days.   Allergies - helps relieve runny nose, itchy eyes and sneezing   Claritin (generic loratidine), Allegra (fexofenidine), or Zyrtec (generic cyrterizine) for runny nose. These medications should not cause drowsiness.  Note - Benadryl (generic diphenhydramine) may be used however may cause drowsiness  Cough -   Delsym or Robitussin (generic dextromethorphan)  Expectorants - helps loosen mucus to ease removal   Mucinex (generic guaifenesin) as directed on the package.  Headaches / General Aches   Tylenol (generic acetaminophen) - DO NOT EXCEED 3 grams (3,000 mg) in a 24 hour time period  Advil/Motrin (generic ibuprofen)   Sore Throat -   Salt water gargle   Chloraseptic (generic benzocaine) spray or lozenges / Sucrets (generic dyclonine)

## 2019-07-12 NOTE — Progress Notes (Signed)
Subjective:    Patient ID: Adam Chan, male    DOB: 07/10/1970, 49 y.o.   MRN: [id number]  Chief Complaint  Patient presents with  . Sore Throat    Pt stated ---sore throat, body ache, swallowing is painful--5 days     HPI:  Adam Chan is a 50 y.o. male with HIV disease and type 2 diabetes presenting today for an acute office visit.  Adam Chan has been experiencing sore throat and body aches that have been going on for approximately 3 days now.  Afebrile since symptoms started. Severity of symptoms makes it painful to swallow and effecting his eating and drinking. Has been refractory to peroxide, chloreseptic and salt water. Course of the symptoms have slightly worsened. Describes mucus production that is brownish in color. Also having headaches and sinus pain and pressure. No sick contacts that he is aware of.           Allergies  Allergen Reactions  . Sulfa Antibiotics Anaphylaxis  . Sulfur Other (See Comments)      Outpatient Medications Prior to Visit  Medication Sig Dispense Refill  . bictegravir-emtricitabine-tenofovir AF (BIKTARVY) 50-200-25 MG TABS tablet Take 1 tablet by mouth daily. 30 tablet 3  . canagliflozin (INVOKANA) 100 MG TABS tablet Take 1 tablet (100 mg total) by mouth daily before breakfast. 30 tablet 2  . CRESTOR 40 MG tablet TAKE 1 TABLET(40 MG) BY MOUTH DAILY 30 tablet 5  . Dulaglutide (TRULICITY) 1.5 MG/0.5ML SOPN Inject 0.5 ml subcutaneously weekly. 2 mL 3  . hydrochlorothiazide (HYDRODIURIL) 25 MG tablet TAKE 1 TABLET(25 MG) BY MOUTH DAILY 30 tablet 2  . metFORMIN (GLUCOPHAGE) 1000 MG tablet TAKE 1 TABLET(1000 MG) BY MOUTH TWICE DAILY WITH A MEAL 60 tablet 1  . omeprazole (PRILOSEC) 20 MG capsule TAKE 1 CAPSULE(20 MG) BY MOUTH DAILY 30 capsule 5  . ondansetron (ZOFRAN) 8 MG tablet TAKE 1 TABLET(8 MG) BY MOUTH EVERY 8 HOURS AS NEEDED FOR NAUSEA OR VOMITING 20 tablet 1  . albuterol (PROVENTIL HFA;VENTOLIN HFA) 108 (90 Base) MCG/ACT inhaler  Inhale 2 puffs into the lungs every 6 (six) hours as needed for wheezing or shortness of breath.    . EPINEPHrine 0.3 mg/0.3 mL IJ SOAJ injection Inject 0.3 mg into the muscle once.    . escitalopram (LEXAPRO) 20 MG tablet TAKE 1 TABLET(20 MG) BY MOUTH DAILY (Patient not taking: Reported on 07/12/2019) 30 tablet 1   No facility-administered medications prior to visit.     Past Medical History:  Diagnosis Date  . Depression 04/06/2018  . Diabetes type 1, controlled (HCC) 04/06/2018  . HIV (human immunodeficiency virus infection) (HCC) 04/06/2018  . Hypertension   . Nausea in adult 04/06/2018     Past Surgical History:  Procedure Laterality Date  . Bilaterla mesh for Hernia Bilateral 2009  . CHOLECYSTECTOMY         Review of Systems  Constitutional: Positive for fatigue. Negative for chills and fever.  HENT: Positive for sinus pressure, sinus pain, sore throat and trouble swallowing. Negative for voice change.   Respiratory: Negative for cough, chest tightness and shortness of breath.       Objective:    BP 135/90   Pulse (!) 106   Temp 98.4 F (36.9 C)   Ht 5\' 11"  (1.803 m)   Wt 168 lb (76.2 kg)   SpO2 95%   BMI 23.43 kg/m  Nursing note and vital signs reviewed.  Physical Exam Constitutional:  General: He is not in acute distress.    Appearance: He is well-developed.  HENT:     Mouth/Throat:     Lips: Pink.     Mouth: Mucous membranes are moist.     Pharynx: Posterior oropharyngeal erythema present. No pharyngeal swelling or oropharyngeal exudate.     Tonsils: No tonsillar exudate or tonsillar abscesses.  Cardiovascular:     Rate and Rhythm: Normal rate and regular rhythm.     Heart sounds: Normal heart sounds.  Pulmonary:     Effort: Pulmonary effort is normal.     Breath sounds: Normal breath sounds.  Skin:    General: Skin is warm and dry.  Neurological:     Mental Status: He is alert and oriented to person, place, and time.  Psychiatric:         Behavior: Behavior normal.        Thought Content: Thought content normal.        Judgment: Judgment normal.      Depression screen [facility] 2/9 06/13/2019 06/02/2018  Decreased Interest 0 0  Down, Depressed, Hopeless 0 0  PHQ - 2 Score 0 0       Assessment & Plan:    Patient Active Problem List   Diagnosis Date Noted  . Sore throat 07/12/2019  . Early syphilis, latent 06/13/2019  . Type 2 diabetes mellitus with hyperglycemia, without long-term current use of insulin ([place]) 06/02/2018  . Recurrent major depressive disorder, in full remission ([place]) 06/02/2018  . Essential hypertension 06/02/2018  . Healthcare maintenance 06/02/2018  . Human immunodeficiency virus (HIV) disease ([place]) 08/02/2017     Problem List Items Addressed This Visit      Other   Sore throat    Adam Chan has symptoms of sore throat likely viral pharyngitis however unable to check for streptococcus infection and will treat with Augmentin x 7 days. Continue over the counter medications as needed for symptom relief and supportive care. Follow up as needed.           I am having Adam Chan start on amoxicillin-clavulanate. I am also having him maintain his albuterol, EPINEPHrine, Crestor, Trulicity, hydrochlorothiazide, ondansetron, omeprazole, canagliflozin, Biktarvy, escitalopram, and metFORMIN.   Meds ordered this encounter  Medications  . amoxicillin-clavulanate (AUGMENTIN) 875-125 MG tablet    Sig: Take 1 tablet by mouth 2 (two) times daily.    Dispense:  14 tablet    Refill:  0    Order Specific Question:   Supervising Provider    Answer:   Carlyle Basques [[id number]]     Follow-up: Return if symptoms worsen or fail to improve.   Terri Piedra, MSN, FNP-C Nurse Practitioner [facility] for Infectious Disease [place] number: [phone]

## 2019-07-12 NOTE — Assessment & Plan Note (Signed)
[name] has symptoms of sore throat likely viral pharyngitis however unable to check for streptococcus infection and will treat with Augmentin x 7 days. Continue over the counter medications as needed for symptom relief and supportive care. Follow up as needed.

## 2019-08-30 ENCOUNTER — Other Ambulatory Visit: Payer: Self-pay | Admitting: Family

## 2019-08-30 DIAGNOSIS — F3342 Major depressive disorder, recurrent, in full remission: Secondary | ICD-10-CM

## 2019-08-30 DIAGNOSIS — E1165 Type 2 diabetes mellitus with hyperglycemia: Secondary | ICD-10-CM

## 2019-09-02 ENCOUNTER — Other Ambulatory Visit: Payer: Self-pay | Admitting: Family

## 2019-09-10 ENCOUNTER — Other Ambulatory Visit: Payer: Self-pay | Admitting: Family

## 2019-09-10 ENCOUNTER — Other Ambulatory Visit: Payer: Self-pay

## 2019-09-10 DIAGNOSIS — B20 Human immunodeficiency virus [HIV] disease: Secondary | ICD-10-CM

## 2019-09-10 DIAGNOSIS — E1165 Type 2 diabetes mellitus with hyperglycemia: Secondary | ICD-10-CM

## 2019-09-12 ENCOUNTER — Other Ambulatory Visit: Payer: Self-pay

## 2019-09-12 DIAGNOSIS — E1165 Type 2 diabetes mellitus with hyperglycemia: Secondary | ICD-10-CM

## 2019-09-12 DIAGNOSIS — B20 Human immunodeficiency virus [HIV] disease: Secondary | ICD-10-CM

## 2019-09-13 LAB — T-HELPER CELL (CD4) - (RCID CLINIC ONLY)
CD4 % Helper T Cell: 44 % (ref 33–65)
CD4 T Cell Abs: 1143 /uL (ref 400–1790)

## 2019-09-14 LAB — HIV-1 RNA QUANT-NO REFLEX-BLD
HIV 1 RNA Quant: 35 copies/mL — ABNORMAL HIGH
HIV-1 RNA Quant, Log: 1.54 Log copies/mL — ABNORMAL HIGH

## 2019-09-14 LAB — HEMOGLOBIN A1C
Hgb A1c MFr Bld: 12.2 % of total Hgb — ABNORMAL HIGH (ref <5.7)
Mean Plasma Glucose: 303 (calc)
eAG (mmol/L): 16.8 (calc)

## 2019-09-20 ENCOUNTER — Ambulatory Visit: Payer: Self-pay

## 2019-09-24 ENCOUNTER — Other Ambulatory Visit: Payer: Self-pay

## 2019-09-24 ENCOUNTER — Encounter: Payer: Self-pay | Admitting: Family

## 2019-09-24 ENCOUNTER — Ambulatory Visit (INDEPENDENT_AMBULATORY_CARE_PROVIDER_SITE_OTHER): Payer: Self-pay | Admitting: Family

## 2019-09-24 ENCOUNTER — Telehealth: Payer: Self-pay | Admitting: Pharmacy Technician

## 2019-09-24 VITALS — BP 135/93 | HR 83 | Temp 97.8°F | Ht 67.0 in | Wt 167.0 lb

## 2019-09-24 DIAGNOSIS — B20 Human immunodeficiency virus [HIV] disease: Secondary | ICD-10-CM

## 2019-09-24 DIAGNOSIS — E1165 Type 2 diabetes mellitus with hyperglycemia: Secondary | ICD-10-CM

## 2019-09-24 DIAGNOSIS — A515 Early syphilis, latent: Secondary | ICD-10-CM

## 2019-09-24 MED ORDER — OZEMPIC (0.25 OR 0.5 MG/DOSE) 2 MG/1.5ML ~~LOC~~ SOPN: PEN_INJECTOR | SUBCUTANEOUS | 2 refills | Status: DC

## 2019-09-24 MED ORDER — SITAGLIPTIN PHOSPHATE 100 MG PO TABS: 100.0000 mg | ORAL_TABLET | Freq: Every day | ORAL | 2 refills | Status: DC

## 2019-09-24 MED ORDER — BIKTARVY 50-200-25 MG PO TABS: 1.0000 | ORAL_TABLET | Freq: Every day | ORAL | 5 refills | Status: DC

## 2019-09-24 NOTE — Assessment & Plan Note (Signed)
Stable.  No rashes or lesions.  Check RPR today.

## 2019-09-24 NOTE — Telephone Encounter (Addendum)
[facility] Patient Advocate Encounter  Completed application for Ozempic patient assistance.  Mr. Eckhardt sent in his Federal tax return.    It was attached and faxed to [employer] [phone] (p).  I will continue to follow.  Netty Starring. Dimas Aguas CPhT [facility]ty [facility]acy Patient [facility] for Infectious Disease Phone: [phone] Fax:  [phone]

## 2019-09-24 NOTE — Assessment & Plan Note (Signed)
Adam Chan has worsening levels of blood sugars with hemoglobin A1c of 12.2 up from previous 9.  This is likely multifactorial including stress and nutrition related.  He is eating less carbohydrates and less meals per day.  May need further work-up through endocrinology.  Continue current dose of Metformin and Invokana.  Add Januvia.  We will check into Ozempic to replace Trulicity.  Encouraged to monitor blood sugars at home and decrease carbohydrate intake.  Continue to monitor.

## 2019-09-24 NOTE — Assessment & Plan Note (Signed)
Mr. Ozment continues to have well-controlled HIV disease with good adherence and tolerance to his ART regimen of Biktarvy.  No signs/symptoms of opportunistic infection or progressive HIV disease.  We reviewed lab work and discussed the plan of care.  Continue current dose of Biktarvy.  Plan for follow-up in 3 months or sooner if needed.

## 2019-09-24 NOTE — Progress Notes (Signed)
Subjective:    Patient ID: Currie Dennin, male    DOB: January 20, 1971, 49 y.o.   MRN: [id number]  Chief Complaint  Patient presents with  . Follow-up     HPI:  Jackson Coffield is a 49 y.o. male with HIV disease and poorly controlled type 2 diabetes last seen in the office for an acute visit with sore throat on 07/12/2019 with continued adherence and tolerance to his ART regimen of Biktarvy.  Most recent blood work completed on 09/12/2019 with a viral load of 35 and undetectable with CD4 count of 1143.  Hemoglobin A1c remained at 12.2.  Mr. Tallman continues to take his Biktarvy as prescribed with no adverse side effects or missed doses.  Has increased levels of fatigue and urination recently otherwise feeling well. Denies fevers, chills, night sweats, headaches, changes in vision, neck pain/stiffness, nausea, diarrhea, vomiting, lesions or rashes.  Mr. Dhanani has no problem obtaining his medication from the pharmacy and remains covered through [employer].  Denies feelings of being down, depressed, or hopeless recently.  No recreational or illicit drug use, tobacco use, or alcohol consumption.  Not currently sexually active and declines condoms.  Mr. Ehle has been out of Trulicity for the past 4 weeks and continues to take his Metformin and Invokana as prescribed.  Has increased levels of urination since starting Invokana.  Has increased levels of fatigue over the last several weeks.  Nutritional intake has decreased and is eating 1-2 meals per day on average and working on reducing carbohydrates.  Denies any numbness or tingling or changes in vision.   Allergies  Allergen Reactions  . Sulfa Antibiotics Anaphylaxis  . Sulfur Other (See Comments)      Outpatient Medications Prior to Visit  Medication Sig Dispense Refill  . EPINEPHrine 0.3 mg/0.3 mL IJ SOAJ injection Inject 0.3 mg into the muscle once.    . escitalopram (LEXAPRO) 20 MG tablet TAKE 1 TABLET(20 MG) BY MOUTH DAILY 30  tablet 1  . hydrochlorothiazide (HYDRODIURIL) 25 MG tablet TAKE 1 TABLET(25 MG) BY MOUTH DAILY 30 tablet 2  . INVOKANA 100 MG TABS tablet TAKE 1 TABLET(100 MG) BY MOUTH DAILY BEFORE BREAKFAST 30 tablet 2  . metFORMIN (GLUCOPHAGE) 1000 MG tablet TAKE 1 TABLET(1000 MG) BY MOUTH TWICE DAILY WITH A MEAL 60 tablet 1  . omeprazole (PRILOSEC) 20 MG capsule TAKE 1 CAPSULE(20 MG) BY MOUTH DAILY 30 capsule 5  . ondansetron (ZOFRAN) 8 MG tablet TAKE 1 TABLET(8 MG) BY MOUTH EVERY 8 HOURS AS NEEDED FOR NAUSEA OR VOMITING 20 tablet 1  . bictegravir-emtricitabine-tenofovir AF (BIKTARVY) 50-200-25 MG TABS tablet Take 1 tablet by mouth daily. 30 tablet 3  . CRESTOR 40 MG tablet TAKE 1 TABLET(40 MG) BY MOUTH DAILY (Patient not taking: Reported on 09/24/2019) 30 tablet 5  . albuterol (PROVENTIL HFA;VENTOLIN HFA) 108 (90 Base) MCG/ACT inhaler Inhale 2 puffs into the lungs every 6 (six) hours as needed for wheezing or shortness of breath.    Marland Kitchen amoxicillin-clavulanate (AUGMENTIN) 875-125 MG tablet Take 1 tablet by mouth 2 (two) times daily. 14 tablet 0  . Dulaglutide (TRULICITY) 1.5 OH/6.0VP SOPN Inject 0.5 ml subcutaneously weekly. (Patient not taking: Reported on 09/24/2019) 2 mL 3   No facility-administered medications prior to visit.     Past Medical History:  Diagnosis Date  . Depression 04/06/2018  . Diabetes type 1, controlled ([place]) 04/06/2018  . HIV (human immunodeficiency virus infection) ([place]) 04/06/2018  . Hypertension   . Nausea in adult 04/06/2018  Past Surgical History:  Procedure Laterality Date  . Bilaterla mesh for Hernia Bilateral 2009  . CHOLECYSTECTOMY        Review of Systems  Constitutional: Positive for fatigue. Negative for appetite change, chills, fever and unexpected weight change.  Eyes: Negative for visual disturbance.  Respiratory: Negative for cough, chest tightness, shortness of breath and wheezing.   Cardiovascular: Negative for chest pain and leg swelling.   Gastrointestinal: Negative for abdominal pain, constipation, diarrhea, nausea and vomiting.  Genitourinary: Positive for frequency. Negative for dysuria, flank pain, genital sores, hematuria and urgency.  Skin: Negative for rash.  Allergic/Immunologic: Negative for immunocompromised state.  Neurological: Negative for dizziness and headaches.      Objective:    BP (!) 135/93   Pulse 83   Temp 97.8 F (36.6 C)   Ht 5\' 7"  (1.702 m)   Wt 167 lb (75.8 kg)   SpO2 97%   BMI 26.16 kg/m  Nursing note and vital signs reviewed.  Physical Exam Constitutional:      General: He is not in acute distress.    Appearance: He is well-developed.  Eyes:     Conjunctiva/sclera: Conjunctivae normal.  Cardiovascular:     Rate and Rhythm: Normal rate and regular rhythm.     Heart sounds: Normal heart sounds. No murmur. No friction rub. No gallop.   Pulmonary:     Effort: Pulmonary effort is normal. No respiratory distress.     Breath sounds: Normal breath sounds. No wheezing or rales.  Chest:     Chest wall: No tenderness.  Abdominal:     General: Bowel sounds are normal.     Palpations: Abdomen is soft.     Tenderness: There is no abdominal tenderness.  Musculoskeletal:     Cervical back: Neck supple.  Lymphadenopathy:     Cervical: No cervical adenopathy.  Skin:    General: Skin is warm and dry.     Findings: No rash.  Neurological:     Mental Status: He is alert and oriented to person, place, and time.  Psychiatric:        Behavior: Behavior normal.        Thought Content: Thought content normal.        Judgment: Judgment normal.     Depression screen [facility] 2/9 06/13/2019 06/02/2018  Decreased Interest 0 0  Down, Depressed, Hopeless 0 0  PHQ - 2 Score 0 0       Assessment & Plan:    Patient Active Problem List   Diagnosis Date Noted  . Early syphilis, latent 06/13/2019  . Type 2 diabetes mellitus with hyperglycemia, without long-term current use of insulin (HCC) 06/02/2018  .  Recurrent major depressive disorder, in full remission (HCC) 06/02/2018  . Essential hypertension 06/02/2018  . Healthcare maintenance 06/02/2018  . Human immunodeficiency virus (HIV) disease (HCC) 08/02/2017     Problem List Items Addressed This Visit      Endocrine   Type 2 diabetes mellitus with hyperglycemia, without long-term current use of insulin (HCC) - Primary    Mr. Ende has worsening levels of blood sugars with hemoglobin A1c of 12.2 up from previous 9.  This is likely multifactorial including stress and nutrition related.  He is eating less carbohydrates and less meals per day.  May need further work-up through endocrinology.  Continue current dose of Metformin and Invokana.  Add Januvia.  We will check into Ozempic to replace Trulicity.  Encouraged to monitor blood sugars at home and  decrease carbohydrate intake.  Continue to monitor.      Relevant Medications   sitaGLIPtin (JANUVIA) 100 MG tablet   Semaglutide,0.25 or 0.5MG /DOS, (OZEMPIC, 0.25 OR 0.5 MG/DOSE,) 2 MG/1.5ML SOPN     Other   Human immunodeficiency virus (HIV) disease (HCC)    Mr. Nudelman continues to have well-controlled HIV disease with good adherence and tolerance to his ART regimen of Biktarvy.  No signs/symptoms of opportunistic infection or progressive HIV disease.  We reviewed lab work and discussed the plan of care.  Continue current dose of Biktarvy.  Plan for follow-up in 3 months or sooner if needed.      Relevant Medications   bictegravir-emtricitabine-tenofovir AF (BIKTARVY) 50-200-25 MG TABS tablet   Early syphilis, latent    Stable.  No rashes or lesions.  Check RPR today.      Relevant Medications   bictegravir-emtricitabine-tenofovir AF (BIKTARVY) 50-200-25 MG TABS tablet   Other Relevant Orders   RPR       I have discontinued Drequan Sine's albuterol, Trulicity, and amoxicillin-clavulanate. I am also having him start on sitaGLIPtin and Ozempic (0.25 or 0.5 MG/DOSE).  Additionally, I am having him maintain his EPINEPHrine, Crestor, ondansetron, omeprazole, hydrochlorothiazide, escitalopram, metFORMIN, Invokana, and Biktarvy.   Meds ordered this encounter  Medications  . sitaGLIPtin (JANUVIA) 100 MG tablet    Sig: Take 1 tablet (100 mg total) by mouth daily.    Dispense:  30 tablet    Refill:  2    Order Specific Question:   Supervising Provider    Answer:   Judyann Munson [[id number]]  . bictegravir-emtricitabine-tenofovir AF (BIKTARVY) 50-200-25 MG TABS tablet    Sig: Take 1 tablet by mouth daily.    Dispense:  30 tablet    Refill:  5    Order Specific Question:   Supervising Provider    Answer:   Judyann Munson [[id number]]  . Semaglutide,0.25 or 0.5MG /DOS, (OZEMPIC, 0.25 OR 0.5 MG/DOSE,) 2 MG/1.5ML SOPN    Sig: Inject 0.25 mg subcutaneously weekly x 4 weeks then increase to 0.5 mg weekly.    Dispense:  2 pen    Refill:  2    Order Specific Question:   Supervising Provider    Answer:   Judyann Munson [[id number]]     Follow-up: Return in about 3 months (around 12/25/2019), or if symptoms worsen or fail to improve.   Marcos Eke, MSN, FNP-C Nurse Practitioner [facility] for Infectious Disease [facility] Main number: [phone]

## 2019-09-24 NOTE — Patient Instructions (Signed)
Nice to see you.  We will check your lab work for syphilis today.  Continue to take your [place] daily.  Continue to take Metformin and Invokana. A prescription for Januvia has been sent to the pharmacy and we will work on getting you Ozepmic.  Refills of medication have been sent to the pharmacy.  Plan for follow up in 3 month or sooner if needed.

## 2019-09-25 LAB — FLUORESCENT TREPONEMAL AB(FTA)-IGG-BLD: Fluorescent Treponemal ABS: REACTIVE — AB

## 2019-09-25 LAB — RPR: RPR Ser Ql: REACTIVE — AB

## 2019-09-25 LAB — RPR TITER: RPR Titer: 1:1 {titer} — ABNORMAL HIGH

## 2019-10-15 NOTE — Telephone Encounter (Addendum)
[facility] Patient Advocate Encounter  Received fax notification this morning from [employer] that the patient has been approved to receive his medication at no charge. He is approved until Oct 05, 2020 and the first shipment will include four months of medication and arrive to the clinic within 10-14 days. We will reach out via phone when the medication arrives to coordinate pick up.   Attempted to call the patient and alert him of the approval status and call went straight to voicemail. Voicemail was left.   Patient called back and aware of the estimated timeline and will come to pick up once we call.

## 2019-10-15 NOTE — Telephone Encounter (Signed)
All taken care of and approved, sent electronic message. Will reach out via phone as well to the patient.

## 2019-10-15 NOTE — Telephone Encounter (Signed)
Can you check?  Thanks

## 2019-10-21 ENCOUNTER — Encounter: Payer: Self-pay | Admitting: Pharmacy Technician

## 2019-10-21 ENCOUNTER — Telehealth: Payer: Self-pay | Admitting: Pharmacy Technician

## 2019-10-21 NOTE — Telephone Encounter (Signed)
[facility] Patient Advocate Encounter  Patient's medication arrived to the clinic and has been stored in the medication refrigerator until he is able to pick up. Patient message sent but a phone call has not been placed yet to let him know it officially arrived. The phone call to the patient was to let him know the estimated date it would arrive.

## 2019-10-22 ENCOUNTER — Telehealth: Payer: Self-pay | Admitting: Pharmacy Technician

## 2019-10-22 NOTE — Telephone Encounter (Addendum)
[facility] Patient Advocate Encounter  I spoke with patient and he picked up Ozempic today.  It was stored in the clinic refrigerator and I put an ice block in the package to keep medication cool during transport.  I reviewed the administration instructions and gave him the manufacturer phone number as back up.  Netty Starring. Dimas Aguas CPhT Specialty Pharmacy Patient [facility] for Infectious Disease Phone: [phone] Fax:  [phone]

## 2019-10-30 ENCOUNTER — Other Ambulatory Visit: Payer: Self-pay

## 2019-10-30 DIAGNOSIS — E1165 Type 2 diabetes mellitus with hyperglycemia: Secondary | ICD-10-CM

## 2019-10-30 MED ORDER — METFORMIN HCL 1000 MG PO TABS: ORAL_TABLET | ORAL | 1 refills | Status: DC

## 2019-11-04 ENCOUNTER — Other Ambulatory Visit: Payer: Self-pay | Admitting: Family

## 2019-11-04 DIAGNOSIS — F3342 Major depressive disorder, recurrent, in full remission: Secondary | ICD-10-CM

## 2019-11-11 ENCOUNTER — Other Ambulatory Visit: Payer: Self-pay | Admitting: Family

## 2019-11-22 ENCOUNTER — Other Ambulatory Visit: Payer: Self-pay | Admitting: Family

## 2019-11-25 ENCOUNTER — Other Ambulatory Visit: Payer: Self-pay | Admitting: Family

## 2019-11-25 DIAGNOSIS — K219 Gastro-esophageal reflux disease without esophagitis: Secondary | ICD-10-CM

## 2019-12-11 ENCOUNTER — Other Ambulatory Visit: Payer: Self-pay

## 2019-12-17 ENCOUNTER — Other Ambulatory Visit: Payer: Self-pay

## 2019-12-17 DIAGNOSIS — B20 Human immunodeficiency virus [HIV] disease: Secondary | ICD-10-CM

## 2019-12-18 ENCOUNTER — Other Ambulatory Visit: Payer: Self-pay

## 2019-12-18 DIAGNOSIS — B20 Human immunodeficiency virus [HIV] disease: Secondary | ICD-10-CM

## 2019-12-19 ENCOUNTER — Other Ambulatory Visit: Payer: Self-pay

## 2019-12-19 LAB — T-HELPER CELL (CD4) - (RCID CLINIC ONLY)
CD4 % Helper T Cell: 32 % — ABNORMAL LOW (ref 33–65)
CD4 T Cell Abs: 850 /uL (ref 400–1790)

## 2019-12-20 LAB — CBC WITH DIFFERENTIAL/PLATELET
Absolute Monocytes: 442 cells/uL (ref 200–950)
Basophils Absolute: 28 cells/uL (ref 0–200)
Basophils Relative: 0.5 %
Eosinophils Absolute: 140 cells/uL (ref 15–500)
Eosinophils Relative: 2.5 %
HCT: 47.8 % (ref 38.5–50.0)
Hemoglobin: 16.3 g/dL (ref 13.2–17.1)
Lymphs Abs: 2750 cells/uL (ref 850–3900)
MCH: 33.7 pg — ABNORMAL HIGH (ref 27.0–33.0)
MCHC: 34.1 g/dL (ref 32.0–36.0)
MCV: 98.8 fL (ref 80.0–100.0)
MPV: 9 fL (ref 7.5–12.5)
Monocytes Relative: 7.9 %
Neutro Abs: 2240 cells/uL (ref 1500–7800)
Neutrophils Relative %: 40 %
Platelets: 249 10*3/uL (ref 140–400)
RBC: 4.84 10*6/uL (ref 4.20–5.80)
RDW: 13.5 % (ref 11.0–15.0)
Total Lymphocyte: 49.1 %
WBC: 5.6 10*3/uL (ref 3.8–10.8)

## 2019-12-20 LAB — COMPREHENSIVE METABOLIC PANEL
AG Ratio: 1.7 (calc) (ref 1.0–2.5)
ALT: 51 U/L — ABNORMAL HIGH (ref 9–46)
AST: 42 U/L — ABNORMAL HIGH (ref 10–35)
Albumin: 4.6 g/dL (ref 3.6–5.1)
Alkaline phosphatase (APISO): 85 U/L (ref 35–144)
BUN: 19 mg/dL (ref 7–25)
CO2: 27 mmol/L (ref 20–32)
Calcium: 9.5 mg/dL (ref 8.6–10.3)
Chloride: 98 mmol/L (ref 98–110)
Creat: 0.74 mg/dL (ref 0.70–1.33)
Globulin: 2.7 g/dL (calc) (ref 1.9–3.7)
Glucose, Bld: 230 mg/dL — ABNORMAL HIGH (ref 65–99)
Potassium: 4.3 mmol/L (ref 3.5–5.3)
Sodium: 135 mmol/L (ref 135–146)
Total Bilirubin: 0.8 mg/dL (ref 0.2–1.2)
Total Protein: 7.3 g/dL (ref 6.1–8.1)

## 2019-12-20 LAB — HIV-1 RNA QUANT-NO REFLEX-BLD
HIV 1 RNA Quant: 73 Copies/mL — ABNORMAL HIGH
HIV-1 RNA Quant, Log: 1.87 Log cps/mL — ABNORMAL HIGH

## 2019-12-21 ENCOUNTER — Other Ambulatory Visit: Payer: Self-pay | Admitting: Family

## 2019-12-21 DIAGNOSIS — E1165 Type 2 diabetes mellitus with hyperglycemia: Secondary | ICD-10-CM

## 2019-12-21 DIAGNOSIS — F3342 Major depressive disorder, recurrent, in full remission: Secondary | ICD-10-CM

## 2019-12-25 ENCOUNTER — Encounter: Payer: Self-pay | Admitting: Family

## 2020-01-09 ENCOUNTER — Telehealth: Payer: Self-pay

## 2020-01-09 NOTE — Telephone Encounter (Signed)
Patient called office today for A1C results. States this was supposed to be collected during last lab appointment in August. No orders/results are in Epic for this test. Will forward message to FNP regarding lab.  Lorenso Courier, [place]

## 2020-01-16 ENCOUNTER — Encounter: Payer: Self-pay | Admitting: Family

## 2020-01-17 NOTE — Telephone Encounter (Signed)
Agree with A1c.  Thanks!

## 2020-01-18 ENCOUNTER — Other Ambulatory Visit: Payer: Self-pay

## 2020-01-18 DIAGNOSIS — E1165 Type 2 diabetes mellitus with hyperglycemia: Secondary | ICD-10-CM

## 2020-01-23 ENCOUNTER — Encounter: Payer: Self-pay | Admitting: Family

## 2020-01-23 ENCOUNTER — Ambulatory Visit (INDEPENDENT_AMBULATORY_CARE_PROVIDER_SITE_OTHER): Payer: Self-pay | Admitting: Family

## 2020-01-23 ENCOUNTER — Ambulatory Visit: Payer: Self-pay

## 2020-01-23 ENCOUNTER — Other Ambulatory Visit: Payer: Self-pay

## 2020-01-23 VITALS — BP 139/91 | HR 98 | Temp 98.2°F | Wt 161.0 lb

## 2020-01-23 DIAGNOSIS — Z0000 Encounter for general adult medical examination without abnormal findings: Secondary | ICD-10-CM

## 2020-01-23 DIAGNOSIS — B20 Human immunodeficiency virus [HIV] disease: Secondary | ICD-10-CM

## 2020-01-23 DIAGNOSIS — E1165 Type 2 diabetes mellitus with hyperglycemia: Secondary | ICD-10-CM

## 2020-01-23 MED ORDER — ONDANSETRON HCL 8 MG PO TABS: ORAL_TABLET | ORAL | 1 refills | Status: DC

## 2020-01-23 MED ORDER — BIKTARVY 50-200-25 MG PO TABS: 1.0000 | ORAL_TABLET | Freq: Every day | ORAL | 5 refills | Status: DC

## 2020-01-23 MED ORDER — OZEMPIC (0.25 OR 0.5 MG/DOSE) 2 MG/1.5ML ~~LOC~~ SOPN: PEN_INJECTOR | SUBCUTANEOUS | 3 refills | Status: DC

## 2020-01-23 MED ORDER — SITAGLIPTIN PHOSPHATE 100 MG PO TABS: 100.0000 mg | ORAL_TABLET | Freq: Every day | ORAL | 2 refills | Status: DC

## 2020-01-23 NOTE — Patient Instructions (Signed)
Nice to see you.  We will check your A1c today.  Continue to take your [place] daily as prescribed.  Refills will be sent to the pharmacy.   We will get your other medications refilled as well.   Plan to follow up in 3 months or sooner if needed.   Have a great day and stay safe!

## 2020-01-23 NOTE — Progress Notes (Signed)
Subjective:    Patient ID: Adam Chan, male    DOB: 05/10/1971, 49 y.o.   MRN: [id number]  Chief Complaint  Patient presents with  . Follow-up  . Medication Refill    Ozempic, Zofran, Januvia      HPI:  Adam Chan is a 49 y.o. male with HIV disease and type 2 diabetes who was last seen in the office on 09/24/2019 with good adherence and tolerance to his ART regimen of Biktarvy.  Viral load at the time was 35 with CD4 count of 1143.  Most recent blood work completed on 12/18/2019 with a viral load of 73 and CD4 count of 850.  Hemoglobin A1c was not drawn at that time.  Here today for routine follow-up.  Adam Chan continues to take his Biktarvy daily as prescribed with no adverse side effects or missed doses.  Overall feeling well today with no new concerns/complaints. Denies fevers, chills, night sweats, headaches, changes in vision, neck pain/stiffness, nausea, diarrhea, vomiting, lesions or rashes.  Adam Chan has no problems obtaining his medication from the pharmacy and remains covered through [employer].  Denies feelings of being down, depressed, or hopeless recently.  No recreational or illicit drug use, tobacco use, or alcohol consumption.  Overall feels much improved and continues to take his Ozempic and Januvia.  Does not currently check blood sugars at home.  Denies excessive hunger, thirst, or urination.  No changes in vision.   Allergies  Allergen Reactions  . Sulfa Antibiotics Anaphylaxis  . Sulfur Other (See Comments)      Outpatient Medications Prior to Visit  Medication Sig Dispense Refill  . bictegravir-emtricitabine-tenofovir AF (BIKTARVY) 50-200-25 MG TABS tablet Take 1 tablet by mouth daily. 30 tablet 5  . EPINEPHrine 0.3 mg/0.3 mL IJ SOAJ injection Inject 0.3 mg into the muscle once.    . escitalopram (LEXAPRO) 20 MG tablet TAKE 1 TABLET(20 MG) BY MOUTH DAILY 30 tablet 1  . hydrochlorothiazide (HYDRODIURIL) 25 MG tablet TAKE 1 TABLET(25 MG) BY MOUTH  DAILY 30 tablet 2  . INVOKANA 100 MG TABS tablet TAKE 1 TABLET(100 MG) BY MOUTH DAILY BEFORE BREAKFAST 30 tablet 2  . metFORMIN (GLUCOPHAGE) 1000 MG tablet TAKE 1 TABLET(1000 MG) BY MOUTH TWICE DAILY WITH A MEAL 60 tablet 1  . omeprazole (PRILOSEC) 20 MG capsule TAKE 1 CAPSULE(20 MG) BY MOUTH DAILY 30 capsule 5  . ondansetron (ZOFRAN) 8 MG tablet TAKE 1 TABLET(8 MG) BY MOUTH EVERY 8 HOURS AS NEEDED FOR NAUSEA OR VOMITING 20 tablet 1  . Semaglutide,0.25 or 0.5MG /DOS, (OZEMPIC, 0.25 OR 0.5 MG/DOSE,) 2 MG/1.5ML SOPN Inject 0.25 mg subcutaneously weekly x 4 weeks then increase to 0.5 mg weekly. 2 pen 2  . sitaGLIPtin (JANUVIA) 100 MG tablet Take 1 tablet (100 mg total) by mouth daily. 30 tablet 2  . CRESTOR 40 MG tablet TAKE 1 TABLET(40 MG) BY MOUTH DAILY (Patient not taking: Reported on 09/24/2019) 30 tablet 5   No facility-administered medications prior to visit.     Past Medical History:  Diagnosis Date  . Depression 04/06/2018  . Diabetes type 1, controlled (HCC) 04/06/2018  . HIV (human immunodeficiency virus infection) (HCC) 04/06/2018  . Hypertension   . Nausea in adult 04/06/2018     Past Surgical History:  Procedure Laterality Date  . Bilaterla mesh for Hernia Bilateral 2009  . CHOLECYSTECTOMY         Review of Systems  Constitutional: Negative for appetite change, chills, fatigue, fever and unexpected weight change.  Eyes:  Negative for visual disturbance.  Respiratory: Negative for cough, chest tightness, shortness of breath and wheezing.   Cardiovascular: Negative for chest pain and leg swelling.  Gastrointestinal: Negative for abdominal pain, constipation, diarrhea, nausea and vomiting.  Genitourinary: Negative for dysuria, flank pain, frequency, genital sores, hematuria and urgency.  Skin: Negative for rash.  Allergic/Immunologic: Negative for immunocompromised state.  Neurological: Negative for dizziness and headaches.      Objective:    BP (!) 139/91   Pulse  98   Temp 98.2 F (36.8 C) (Oral)   Wt 161 lb (73 kg)   BMI 25.22 kg/m  Nursing note and vital signs reviewed.  Physical Exam Constitutional:      General: He is not in acute distress.    Appearance: He is well-developed.  Eyes:     Conjunctiva/sclera: Conjunctivae normal.  Cardiovascular:     Rate and Rhythm: Normal rate and regular rhythm.     Heart sounds: Normal heart sounds. No murmur heard.  No friction rub. No gallop.   Pulmonary:     Effort: Pulmonary effort is normal. No respiratory distress.     Breath sounds: Normal breath sounds. No wheezing or rales.  Chest:     Chest wall: No tenderness.  Abdominal:     General: Bowel sounds are normal.     Palpations: Abdomen is soft.     Tenderness: There is no abdominal tenderness.  Musculoskeletal:     Cervical back: Neck supple.  Lymphadenopathy:     Cervical: No cervical adenopathy.  Skin:    General: Skin is warm and dry.     Findings: No rash.  Neurological:     Mental Status: He is alert and oriented to person, place, and time.  Psychiatric:        Behavior: Behavior normal.        Thought Content: Thought content normal.        Judgment: Judgment normal.      Depression screen [facility] 2/9 06/13/2019 06/02/2018  Decreased Interest 0 0  Down, Depressed, Hopeless 0 0  PHQ - 2 Score 0 0       Assessment & Plan:    Patient Active Problem List   Diagnosis Date Noted  . Early syphilis, latent 06/13/2019  . Type 2 diabetes mellitus with hyperglycemia, without long-term current use of insulin (HCC) 06/02/2018  . Recurrent major depressive disorder, in full remission (HCC) 06/02/2018  . Essential hypertension 06/02/2018  . Healthcare maintenance 06/02/2018  . Human immunodeficiency virus (HIV) disease (HCC) 08/02/2017     Problem List Items Addressed This Visit      Endocrine   Type 2 diabetes mellitus with hyperglycemia, without long-term current use of insulin [facility])    Adam Chan appears to be doing  adequately with his current medication regimen.  Does not currently check blood sugars at home.  We will check hemoglobin A1c today.  Continue current dose of Invokana, Metformin, Ozempic, and Januvia.  Overdue for diabetic eye exam which we will work on scheduling.        Other   Human immunodeficiency virus (HIV) disease (HCC) - Primary    Adam Chan has well-controlled HIV disease with good adherence and tolerance to his ART regimen of Biktarvy.  No signs/symptoms of opportunistic infection or progressive HIV disease.  Reviewed lab work and discussed the plan of care.  Continue current dose of Biktarvy.  Plan for follow-up in 3 months or sooner if needed with lab work 1 to  2 weeks prior to appointment.      Healthcare maintenance    Influenza vaccination updated today.  Discussed importance of safe sexual practice to reduce risk of STI.  Condoms declined.  Covid vaccination up-to-date per recommendations.          I am having Jennie Bolar maintain his EPINEPHrine, Crestor, ondansetron, sitaGLIPtin, Biktarvy, Ozempic (0.25 or 0.5 MG/DOSE), hydrochlorothiazide, Invokana, omeprazole, escitalopram, and metFORMIN.   No orders of the defined types were placed in this encounter.    Follow-up: Return in about 3 months (around 04/23/2020), or if symptoms worsen or fail to improve.   Marcos Eke, MSN, FNP-C Nurse Practitioner [facility] for Infectious Disease [facility] Main number: [phone]

## 2020-01-23 NOTE — Assessment & Plan Note (Signed)
Influenza vaccination updated today.  Discussed importance of safe sexual practice to reduce risk of STI.  Condoms declined.  Covid vaccination up-to-date per recommendations.

## 2020-01-23 NOTE — Assessment & Plan Note (Signed)
Adam Chan appears to be doing adequately with his current medication regimen.  Does not currently check blood sugars at home.  We will check hemoglobin A1c today.  Continue current dose of Invokana, Metformin, Ozempic, and Januvia.  Overdue for diabetic eye exam which we will work on scheduling.

## 2020-01-23 NOTE — Assessment & Plan Note (Signed)
Mr. Schnitzler has well-controlled HIV disease with good adherence and tolerance to his ART regimen of Biktarvy.  No signs/symptoms of opportunistic infection or progressive HIV disease.  Reviewed lab work and discussed the plan of care.  Continue current dose of Biktarvy.  Plan for follow-up in 3 months or sooner if needed with lab work 1 to 2 weeks prior to appointment.

## 2020-01-24 ENCOUNTER — Telehealth: Payer: Self-pay | Admitting: Pharmacy Technician

## 2020-01-24 ENCOUNTER — Encounter: Payer: Self-pay | Admitting: Family

## 2020-01-24 LAB — HEMOGLOBIN A1C
Hgb A1c MFr Bld: 10.1 % of total Hgb — ABNORMAL HIGH (ref <5.7)
Mean Plasma Glucose: 243 (calc)
eAG (mmol/L): 13.5 (calc)

## 2020-01-24 NOTE — Telephone Encounter (Signed)
[facility] Patient Advocate Encounter  Faxed Ozempic prescription to [facility] for additional refills.  Patient is approved through 05/09/2020  Jeannette How, CPhT Specialty Pharmacy Patient [facility] for Infectious Disease Phone: [phone] Fax:  [phone]

## 2020-01-29 ENCOUNTER — Ambulatory Visit: Payer: Self-pay

## 2020-01-29 ENCOUNTER — Other Ambulatory Visit: Payer: Self-pay

## 2020-02-04 NOTE — Telephone Encounter (Signed)
Thank you :)

## 2020-02-25 ENCOUNTER — Other Ambulatory Visit: Payer: Self-pay | Admitting: Family

## 2020-02-25 DIAGNOSIS — F3342 Major depressive disorder, recurrent, in full remission: Secondary | ICD-10-CM

## 2020-04-11 ENCOUNTER — Other Ambulatory Visit: Payer: Self-pay

## 2020-04-11 DIAGNOSIS — B20 Human immunodeficiency virus [HIV] disease: Secondary | ICD-10-CM

## 2020-04-11 DIAGNOSIS — Z79899 Other long term (current) drug therapy: Secondary | ICD-10-CM

## 2020-04-11 DIAGNOSIS — Z113 Encounter for screening for infections with a predominantly sexual mode of transmission: Secondary | ICD-10-CM

## 2020-04-11 LAB — T-HELPER CELL (CD4) - (RCID CLINIC ONLY)
CD4 % Helper T Cell: 41 % (ref 33–65)
CD4 T Cell Abs: 766 /uL (ref 400–1790)

## 2020-04-14 LAB — URINE CYTOLOGY ANCILLARY ONLY
Chlamydia: NEGATIVE
Comment: NEGATIVE
Comment: NORMAL
Neisseria Gonorrhea: NEGATIVE

## 2020-04-15 LAB — COMPREHENSIVE METABOLIC PANEL
AG Ratio: 1.4 (calc) (ref 1.0–2.5)
ALT: 25 U/L (ref 9–46)
AST: 26 U/L (ref 10–35)
Albumin: 3.9 g/dL (ref 3.6–5.1)
Alkaline phosphatase (APISO): 84 U/L (ref 35–144)
BUN/Creatinine Ratio: 17 (calc) (ref 6–22)
BUN: 10 mg/dL (ref 7–25)
CO2: 23 mmol/L (ref 20–32)
Calcium: 8.8 mg/dL (ref 8.6–10.3)
Chloride: 102 mmol/L (ref 98–110)
Creat: 0.58 mg/dL — ABNORMAL LOW (ref 0.70–1.33)
Globulin: 2.8 g/dL (calc) (ref 1.9–3.7)
Glucose, Bld: 319 mg/dL — ABNORMAL HIGH (ref 65–99)
Potassium: 4.2 mmol/L (ref 3.5–5.3)
Sodium: 135 mmol/L (ref 135–146)
Total Bilirubin: 0.6 mg/dL (ref 0.2–1.2)
Total Protein: 6.7 g/dL (ref 6.1–8.1)

## 2020-04-15 LAB — TEST AUTHORIZATION

## 2020-04-15 LAB — CBC WITH DIFFERENTIAL/PLATELET
Absolute Monocytes: 320 cells/uL (ref 200–950)
Basophils Absolute: 12 cells/uL (ref 0–200)
Basophils Relative: 0.3 %
Eosinophils Absolute: 60 cells/uL (ref 15–500)
Eosinophils Relative: 1.5 %
HCT: 45.2 % (ref 38.5–50.0)
Hemoglobin: 15.5 g/dL (ref 13.2–17.1)
Lymphs Abs: 1976 cells/uL (ref 850–3900)
MCH: 31.4 pg (ref 27.0–33.0)
MCHC: 34.3 g/dL (ref 32.0–36.0)
MCV: 91.5 fL (ref 80.0–100.0)
MPV: 8.9 fL (ref 7.5–12.5)
Monocytes Relative: 8 %
Neutro Abs: 1632 cells/uL (ref 1500–7800)
Neutrophils Relative %: 40.8 %
Platelets: 190 10*3/uL (ref 140–400)
RBC: 4.94 10*6/uL (ref 4.20–5.80)
RDW: 14.4 % (ref 11.0–15.0)
Total Lymphocyte: 49.4 %
WBC: 4 10*3/uL (ref 3.8–10.8)

## 2020-04-15 LAB — LIPID PANEL
Cholesterol: 190 mg/dL (ref <200)
HDL: 50 mg/dL (ref >=40)
LDL Cholesterol (Calc): 117 mg/dL (calc) — ABNORMAL HIGH
Non-HDL Cholesterol (Calc): 140 mg/dL (calc) — ABNORMAL HIGH (ref <130)
Total CHOL/HDL Ratio: 3.8 (calc) (ref <5.0)
Triglycerides: 122 mg/dL (ref <150)

## 2020-04-15 LAB — HIV-1 RNA QUANT-NO REFLEX-BLD
HIV 1 RNA Quant: 66 Copies/mL — ABNORMAL HIGH
HIV-1 RNA Quant, Log: 1.82 Log cps/mL — ABNORMAL HIGH

## 2020-04-15 LAB — HEMOGLOBIN A1C
Hgb A1c MFr Bld: 11.4 % of total Hgb — ABNORMAL HIGH (ref <5.7)
Mean Plasma Glucose: 280 mg/dL
eAG (mmol/L): 15.5 mmol/L

## 2020-04-27 ENCOUNTER — Other Ambulatory Visit: Payer: Self-pay | Admitting: Family

## 2020-04-27 DIAGNOSIS — E1165 Type 2 diabetes mellitus with hyperglycemia: Secondary | ICD-10-CM

## 2020-04-27 DIAGNOSIS — F3342 Major depressive disorder, recurrent, in full remission: Secondary | ICD-10-CM

## 2020-05-05 ENCOUNTER — Encounter: Payer: Self-pay | Admitting: Family

## 2020-05-15 ENCOUNTER — Other Ambulatory Visit: Payer: Self-pay

## 2020-05-15 ENCOUNTER — Encounter: Payer: Self-pay | Admitting: Family

## 2020-05-15 ENCOUNTER — Ambulatory Visit (INDEPENDENT_AMBULATORY_CARE_PROVIDER_SITE_OTHER): Payer: Self-pay | Admitting: Family

## 2020-05-15 VITALS — BP 130/83 | HR 97 | Temp 98.0°F | Ht 66.0 in | Wt 155.0 lb

## 2020-05-15 DIAGNOSIS — B20 Human immunodeficiency virus [HIV] disease: Secondary | ICD-10-CM

## 2020-05-15 DIAGNOSIS — K219 Gastro-esophageal reflux disease without esophagitis: Secondary | ICD-10-CM

## 2020-05-15 DIAGNOSIS — F3342 Major depressive disorder, recurrent, in full remission: Secondary | ICD-10-CM

## 2020-05-15 DIAGNOSIS — I10 Essential (primary) hypertension: Secondary | ICD-10-CM

## 2020-05-15 DIAGNOSIS — E1165 Type 2 diabetes mellitus with hyperglycemia: Secondary | ICD-10-CM

## 2020-05-15 MED ORDER — CANAGLIFLOZIN 100 MG PO TABS: ORAL_TABLET | ORAL | 0 refills | Status: DC

## 2020-05-15 MED ORDER — ESCITALOPRAM OXALATE 20 MG PO TABS: ORAL_TABLET | ORAL | 5 refills | Status: DC

## 2020-05-15 MED ORDER — BIKTARVY 50-200-25 MG PO TABS: 1.0000 | ORAL_TABLET | Freq: Every day | ORAL | 5 refills | Status: DC

## 2020-05-15 MED ORDER — HYDROCHLOROTHIAZIDE 25 MG PO TABS: ORAL_TABLET | ORAL | 5 refills | Status: DC

## 2020-05-15 MED ORDER — CRESTOR 40 MG PO TABS: ORAL_TABLET | ORAL | 5 refills | Status: DC

## 2020-05-15 MED ORDER — OMEPRAZOLE 20 MG PO CPDR: DELAYED_RELEASE_CAPSULE | ORAL | 5 refills | Status: DC

## 2020-05-15 MED ORDER — METFORMIN HCL 1000 MG PO TABS: ORAL_TABLET | ORAL | 5 refills | Status: DC

## 2020-05-15 MED ORDER — ONDANSETRON HCL 8 MG PO TABS: ORAL_TABLET | ORAL | 5 refills | Status: DC

## 2020-05-15 MED ORDER — SITAGLIPTIN PHOSPHATE 100 MG PO TABS: ORAL_TABLET | ORAL | 0 refills | Status: DC

## 2020-05-15 NOTE — Assessment & Plan Note (Signed)
Adam Chan continues to have well-controlled HIV disease with good adherence and tolerance to his ART regimen of Biktarvy.  No signs/symptoms of opportunistic infection or progressive HIV disease.  We reviewed lab work and discussed plan of care.  We will need to review financial assistance.  Continue current dose of Biktarvy.  Plan for follow-up in 3 months or sooner if needed.

## 2020-05-15 NOTE — Progress Notes (Signed)
Subjective:    Patient ID: Adam [facility], male    DOB: [birth year], 50 y.o.   MRN: [id number]  Chief Complaint  Patient presents with  . Follow-up     HPI:  Adam [facility] is a 49 y.o. male with HIV disease and type 2 diabetes last seen on 01/23/2020 with good adherence and tolerance to his ART regimen of Biktarvy and multiple medications for type 2 diabetes.  Lab work at that time showed a viral load that was undetectable and CD4 count of 850.  Hemoglobin A1c was 10.1.  Most recent blood work completed on 04/11/2020 with viral load remains undetectable and CD4 count of 766.  Kidney function, liver function, electrolytes within normal ranges.  Hemoglobin A1c increased to 11.4.  Here today for routine follow-up.  [facility] continues to take his Biktarvy and diabetic medications daily as prescribed with no adverse side effects or missed doses since his last office visit.  Overall feeling well today with no new concerns/complaints.  Requesting refills of Ozempic. Denies fevers, chills, night sweats, headaches, changes in vision, neck pain/stiffness, nausea, diarrhea, vomiting, lesions or rashes.  Adam [facility] has no problems obtaining his medication from the pharmacy and remains covered through [employer].  Denies feelings of being down, depressed, or hopeless recently and continues to take his Lexapro.  Continues to smoke marijuana on occasion and consumes alcohol socially.  No current tobacco use.  Declines condoms.  Due for routine dental care.  Has received his COVID booster.     Allergies  Allergen Reactions  . Sulfa Antibiotics Anaphylaxis  . Sulfur Other (See Comments)      Outpatient Medications Prior to Visit  Medication Sig Dispense Refill  . EPINEPHrine 0.3 mg/0.3 mL IJ SOAJ injection Inject 0.3 mg into the muscle once.    . Semaglutide,0.25 or 0.5MG /DOS, (OZEMPIC, 0.25 OR 0.5 MG/DOSE,) 2 MG/1.5ML SOPN Inject 0.25 mg subcutaneously weekly x 4 weeks then increase to 0.5 mg  weekly. 3 mL 3  . bictegravir-emtricitabine-tenofovir AF (BIKTARVY) 50-200-25 MG TABS tablet Take 1 tablet by mouth daily. 30 tablet 5  . CRESTOR 40 MG tablet TAKE 1 TABLET(40 MG) BY MOUTH DAILY 30 tablet 5  . escitalopram (LEXAPRO) 20 MG tablet TAKE 1 TABLET(20 MG) BY MOUTH DAILY 30 tablet 0  . hydrochlorothiazide (HYDRODIURIL) 25 MG tablet TAKE 1 TABLET(25 MG) BY MOUTH DAILY 30 tablet 2  . INVOKANA 100 MG TABS tablet TAKE 1 TABLET(100 MG) BY MOUTH DAILY BEFORE BREAKFAST 30 tablet 0  . JANUVIA 100 MG tablet TAKE 1 TABLET(100 MG) BY MOUTH DAILY 30 tablet 0  . metFORMIN (GLUCOPHAGE) 1000 MG tablet TAKE 1 TABLET(1000 MG) BY MOUTH TWICE DAILY WITH A MEAL 60 tablet 1  . omeprazole (PRILOSEC) 20 MG capsule TAKE 1 CAPSULE(20 MG) BY MOUTH DAILY 30 capsule 5  . ondansetron (ZOFRAN) 8 MG tablet Take 1 tablet by mouth every 8 hours as needed for nausea 20 tablet 1   No facility-administered medications prior to visit.     Past Medical History:  Diagnosis Date  . Depression 04/06/2018  . Diabetes type 1, controlled (HCC) 04/06/2018  . HIV (human immunodeficiency virus infection) (HCC) 04/06/2018  . Hypertension   . Nausea in adult 04/06/2018     Past Surgical History:  Procedure Laterality Date  . Bilaterla mesh for Hernia Bilateral 2009  . CHOLECYSTECTOMY         Review of Systems  Constitutional: Negative for appetite change, chills, fatigue, fever and unexpected weight change.  Eyes:  Negative for visual disturbance.  Respiratory: Negative for cough, chest tightness, shortness of breath and wheezing.   Cardiovascular: Negative for chest pain and leg swelling.  Gastrointestinal: Negative for abdominal pain, constipation, diarrhea, nausea and vomiting.  Genitourinary: Negative for dysuria, flank pain, frequency, genital sores, hematuria and urgency.  Skin: Negative for rash.  Allergic/Immunologic: Negative for immunocompromised state.  Neurological: Negative for dizziness and  headaches.      Objective:    BP 130/83   Pulse 97   Temp 98 F (36.7 C)   Ht 5\' 6"  (1.676 m)   Wt 155 lb (70.3 kg)   BMI 25.02 kg/m  Nursing note and vital signs reviewed.  Physical Exam Constitutional:      General: He is not in acute distress.    Appearance: He is well-developed.  HENT:     Mouth/Throat:     Mouth: Oropharynx is clear and moist.  Eyes:     Conjunctiva/sclera: Conjunctivae normal.  Cardiovascular:     Rate and Rhythm: Normal rate and regular rhythm.     Pulses: Intact distal pulses.     Heart sounds: Normal heart sounds. No murmur heard. No friction rub. No gallop.   Pulmonary:     Effort: Pulmonary effort is normal. No respiratory distress.     Breath sounds: Normal breath sounds. No wheezing or rales.  Chest:     Chest wall: No tenderness.  Abdominal:     General: Bowel sounds are normal.     Palpations: Abdomen is soft.     Tenderness: There is no abdominal tenderness.  Musculoskeletal:     Cervical back: Neck supple.  Lymphadenopathy:     Cervical: No cervical adenopathy.  Skin:    General: Skin is warm and dry.     Findings: No rash.  Neurological:     Mental Status: He is alert and oriented to person, place, and time.  Psychiatric:        Mood and Affect: Mood and affect normal.        Behavior: Behavior normal.        Thought Content: Thought content normal.        Judgment: Judgment normal.     Depression screen [facility] 2/9 06/13/2019 06/02/2018  Decreased Interest 0 0  Down, Depressed, Hopeless 0 0  PHQ - 2 Score 0 0       Assessment & Plan:    Patient Active Problem List   Diagnosis Date Noted  . Early syphilis, latent 06/13/2019  . Type 2 diabetes mellitus with hyperglycemia, without long-term current use of insulin (HCC) 06/02/2018  . Recurrent major depressive disorder, in full remission (HCC) 06/02/2018  . Essential hypertension 06/02/2018  . Healthcare maintenance 06/02/2018  . Human immunodeficiency virus (HIV) disease  (HCC) 08/02/2017     Problem List Items Addressed This Visit      Cardiovascular and Mediastinum   Essential hypertension    Blood pressure adequately controlled and below goal 140/90 with current medications.  Continue current dose of hydrochlorothiazide.      Relevant Medications   CRESTOR 40 MG tablet   hydrochlorothiazide (HYDRODIURIL) 25 MG tablet     Endocrine   Type 2 diabetes mellitus with hyperglycemia, without long-term current use of insulin (HCC)    Mr. Stagliano's type 2 diabetes remains labile with most recent hemoglobin A1c increasing to 11.1.  He is on multiple medications and discussed importance of continued nutritional intake.  Continue current dose of Invokana, metformin, and Ozempic.  We will attempt to schedule diabetic eye and foot exams.  Plan for follow-up in 3 months or sooner if needed.      Relevant Medications   CRESTOR 40 MG tablet   canagliflozin (INVOKANA) 100 MG TABS tablet   sitaGLIPtin (JANUVIA) 100 MG tablet   metFORMIN (GLUCOPHAGE) 1000 MG tablet     Other   Human immunodeficiency virus (HIV) disease ([place])    Mr. Noffke continues to have well-controlled HIV disease with good adherence and tolerance to his ART regimen of Biktarvy.  No signs/symptoms of opportunistic infection or progressive HIV disease.  We reviewed lab work and discussed plan of care.  We will need to review financial assistance.  Continue current dose of Biktarvy.  Plan for follow-up in 3 months or sooner if needed.      Relevant Medications   ondansetron (ZOFRAN) 8 MG tablet   bictegravir-emtricitabine-tenofovir AF (BIKTARVY) 50-200-25 MG TABS tablet   Recurrent major depressive disorder, in full remission ([place])   Relevant Medications   escitalopram (LEXAPRO) 20 MG tablet    Other Visit Diagnoses    Gastroesophageal reflux disease       Relevant Medications   ondansetron (ZOFRAN) 8 MG tablet   omeprazole (PRILOSEC) 20 MG capsule       I have changed Noemi  Studzinski's Invokana to canagliflozin and Januvia to sitaGLIPtin. I have also changed his metFORMIN. I am also having him maintain his EPINEPHrine, Ozempic (0.25 or 0.5 MG/DOSE), ondansetron, Biktarvy, Crestor, escitalopram, hydrochlorothiazide, and omeprazole.   Meds ordered this encounter  Medications  . ondansetron (ZOFRAN) 8 MG tablet    Sig: Take 1 tablet by mouth every 8 hours as needed for nausea    Dispense:  30 tablet    Refill:  5    Order Specific Question:   Supervising Provider    Answer:   Carlyle Basques [[id number]]  . bictegravir-emtricitabine-tenofovir AF (BIKTARVY) 50-200-25 MG TABS tablet    Sig: Take 1 tablet by mouth daily.    Dispense:  30 tablet    Refill:  5    Order Specific Question:   Supervising Provider    Answer:   Carlyle Basques [[id number]]  . CRESTOR 40 MG tablet    Sig: TAKE 1 TABLET(40 MG) BY MOUTH DAILY    Dispense:  30 tablet    Refill:  5    Order Specific Question:   Supervising Provider    Answer:   Carlyle Basques [[id number]]  . escitalopram (LEXAPRO) 20 MG tablet    Sig: TAKE 1 TABLET(20 MG) BY MOUTH DAILY    Dispense:  30 tablet    Refill:  5    Order Specific Question:   Supervising Provider    Answer:   Carlyle Basques [[id number]]  . hydrochlorothiazide (HYDRODIURIL) 25 MG tablet    Sig: TAKE 1 TABLET(25 MG) BY MOUTH DAILY    Dispense:  30 tablet    Refill:  5    Order Specific Question:   Supervising Provider    Answer:   Carlyle Basques [[id number]]  . canagliflozin (INVOKANA) 100 MG TABS tablet    Sig: TAKE 1 TABLET(100 MG) BY MOUTH DAILY BEFORE BREAKFAST    Dispense:  30 tablet    Refill:  0    Order Specific Question:   Supervising Provider    Answer:   Carlyle Basques [[id number]]  . sitaGLIPtin (JANUVIA) 100 MG tablet    Sig: TAKE 1 TABLET(100 MG) BY MOUTH DAILY    Dispense:  30 tablet  Refill:  0    Order Specific Question:   Supervising Provider    Answer:   Judyann Munson [[id number]]  . metFORMIN (GLUCOPHAGE) 1000 MG tablet    Sig: Take 1 tablet by  mouth twice daily.    Dispense:  60 tablet    Refill:  5    Order Specific Question:   Supervising Provider    Answer:   Judyann Munson [[id number]]  . omeprazole (PRILOSEC) 20 MG capsule    Sig: TAKE 1 CAPSULE(20 MG) BY MOUTH DAILY    Dispense:  30 capsule    Refill:  5    Order Specific Question:   Supervising Provider    Answer:   Judyann Munson [[id number]]     Follow-up: Return in about 3 months (around 08/13/2020), or if symptoms worsen or fail to improve.   Marcos Eke, MSN, FNP-C Nurse Practitioner [facility] for Infectious Disease [facility] Main number: [phone]

## 2020-05-15 NOTE — Assessment & Plan Note (Signed)
Adam Chan's type 2 diabetes remains labile with most recent hemoglobin A1c increasing to 11.1.  He is on multiple medications and discussed importance of continued nutritional intake.  Continue current dose of Invokana, metformin, and Ozempic.  We will attempt to schedule diabetic eye and foot exams.  Plan for follow-up in 3 months or sooner if needed.

## 2020-05-15 NOTE — Patient Instructions (Addendum)
Nice to see you.  We will continue your medications.  Refills have been sent to the pharmacy   We will check on your Ozempic.   Plan for follow up in the 3 months or sooner if needed with lab work 1-2 weeks prior to appointment.   Have a great day and stay safe!  Happy Birthday!

## 2020-05-15 NOTE — Assessment & Plan Note (Signed)
Blood pressure adequately controlled and below goal 140/90 with current medications.  Continue current dose of hydrochlorothiazide.

## 2020-05-23 MED ORDER — OZEMPIC (0.25 OR 0.5 MG/DOSE) 2 MG/1.5ML ~~LOC~~ SOPN: PEN_INJECTOR | SUBCUTANEOUS | 3 refills | Status: DC

## 2020-05-27 ENCOUNTER — Other Ambulatory Visit: Payer: Self-pay | Admitting: Family

## 2020-05-27 DIAGNOSIS — K219 Gastro-esophageal reflux disease without esophagitis: Secondary | ICD-10-CM

## 2020-05-28 ENCOUNTER — Ambulatory Visit: Payer: Self-pay

## 2020-05-29 ENCOUNTER — Telehealth: Payer: Self-pay

## 2020-05-29 NOTE — Telephone Encounter (Signed)
Patient called to inquire about ozempic refills. Per chart review patient was approved for patient assistance thru 04/2020. Patient does not have insurance. Lupita Leash will apply for patient assistance in the meantime. RN to follow up with patient and see if he can call the number on the box for a refill and remind him he needs to give our office at least 2 weeks in order for meds to be mailed to us/processed.   Jolyn Deshmukh Loyola Mast, RN

## 2020-06-02 ENCOUNTER — Ambulatory Visit: Payer: Self-pay

## 2020-06-02 ENCOUNTER — Other Ambulatory Visit: Payer: Self-pay

## 2020-06-03 ENCOUNTER — Other Ambulatory Visit: Payer: Self-pay | Admitting: *Deleted

## 2020-06-03 DIAGNOSIS — E1165 Type 2 diabetes mellitus with hyperglycemia: Secondary | ICD-10-CM

## 2020-06-03 MED ORDER — OZEMPIC (0.25 OR 0.5 MG/DOSE) 2 MG/1.5ML ~~LOC~~ SOPN: PEN_INJECTOR | SUBCUTANEOUS | 3 refills | Status: DC

## 2020-06-04 ENCOUNTER — Encounter: Payer: Self-pay | Admitting: Family

## 2020-06-04 ENCOUNTER — Telehealth: Payer: Self-pay

## 2020-06-04 NOTE — Telephone Encounter (Signed)
[facility] Patient Advocate Encounter  I was able to speak to someone at [employer]. They faxed me an refill/reoder form.  I faxed back the order , will have call [employer] @ [phone] in 48 hours to get a delivery date.   Patient is aware on the status  Patient Assistance will expire on 10/05/20.  Clearance Coots, CPhT Specialty Pharmacy Patient [facility] for Infectious Disease Phone: [phone] Fax:  [phone]

## 2020-06-18 ENCOUNTER — Telehealth: Payer: Self-pay

## 2020-06-18 NOTE — Telephone Encounter (Signed)
[facility] Patient Advocate Encounter  I spoke to [employer] Patient Assistance it will take 10-14 days for the delivery of Ozempic. It has been 9 days the rep said, it should be delivered by 06/23/20.   I left a detailed message on the patient phone letting him know I will contact him when it is delivered.  Clearance Coots, CPhT Specialty Pharmacy Patient [facility] for Infectious Disease Phone: [phone] Fax:  [phone]

## 2020-06-23 NOTE — Telephone Encounter (Signed)
I did leave a message on his line to let him know it have not been 14 days yet since ordering the medication. We should be receiving the medication today or tomorrow .

## 2020-06-27 ENCOUNTER — Telehealth: Payer: Self-pay

## 2020-06-27 NOTE — Telephone Encounter (Signed)
[facility] Patient Advocate Encounter  Patient's medication (Ozempic) have been delivered to [facility] from [employer] and will be picked up 06/30/20.  Lot # [id number] EXP : 02/07/2023 NDC: 5400-8676-19  Clearance Coots , CPhT Specialty Pharmacy Patient [facility] for Infectious Disease Phone: [phone] Fax:  [phone]

## 2020-06-30 ENCOUNTER — Other Ambulatory Visit: Payer: Self-pay | Admitting: Family

## 2020-06-30 ENCOUNTER — Telehealth: Payer: Self-pay | Admitting: *Deleted

## 2020-06-30 DIAGNOSIS — E1165 Type 2 diabetes mellitus with hyperglycemia: Secondary | ICD-10-CM

## 2020-06-30 NOTE — Telephone Encounter (Signed)
Received refill request for januvia. Confirmed patient's diabetes regimen with Tammy Sours.  Paitent on Invokana 100 mg, metformin 1000 mg, januvia 100 mg, and ozempic pen. Januvia refill sent. Andree Coss, RN

## 2020-07-09 ENCOUNTER — Other Ambulatory Visit: Payer: Self-pay | Admitting: Family

## 2020-07-18 ENCOUNTER — Encounter: Payer: Self-pay | Admitting: Internal Medicine

## 2020-07-18 ENCOUNTER — Other Ambulatory Visit: Payer: Self-pay

## 2020-07-18 ENCOUNTER — Ambulatory Visit (INDEPENDENT_AMBULATORY_CARE_PROVIDER_SITE_OTHER): Payer: Self-pay | Admitting: Internal Medicine

## 2020-07-18 VITALS — BP 135/99 | HR 123 | Temp 98.1°F | Wt 151.0 lb

## 2020-07-18 DIAGNOSIS — J029 Acute pharyngitis, unspecified: Secondary | ICD-10-CM

## 2020-07-18 DIAGNOSIS — E119 Type 2 diabetes mellitus without complications: Secondary | ICD-10-CM

## 2020-07-18 DIAGNOSIS — B20 Human immunodeficiency virus [HIV] disease: Secondary | ICD-10-CM

## 2020-07-18 MED ORDER — NYSTATIN 100000 UNIT/ML MT SUSP: 10.0000 mL | Freq: Three times a day (TID) | OROMUCOSAL | 0 refills | Status: DC | PRN

## 2020-07-18 NOTE — Progress Notes (Signed)
Subjective:    Patient ID: Adam Chan, male    DOB: 03/21/1971, 50 y.o.   MRN: [id number]  Chief Complaint  Patient presents with  . Follow-up    Sore throat since Sunday; recently tested for COVID; no fevers or chills; no primary care doc; reports severe sore throat; has tried OTC cold/flu medications; gargled salt water, hydrogen peroxide (instructed to stop) and lemon water     HPI:  Adam Chan is a 50 y.o. male with HIV disease and type 2 diabetes last seen on 01/23/2020 with good adherence and tolerance to his ART regimen of Biktarvy and multiple medications for type 2 diabetes.  Lab work at that time showed a viral load that was undetectable and CD4 count of 850.  Hemoglobin A1c was 10.1.  Most recent blood work completed on 04/11/2020 with viral load remains undetectable and CD4 count of 766.  Kidney function, liver function, electrolytes within normal ranges.  Hemoglobin A1c increased to 11.4.  Here today for routine follow-up.  Adam Chan continues to take his Biktarvy and diabetic medications daily as prescribed with no adverse side effects or missed doses since his last office visit.  Overall feeling well today with no new concerns/complaints.  Requesting refills of Ozempic. Denies fevers, chills, night sweats, headaches, changes in vision, neck pain/stiffness, nausea, diarrhea, vomiting, lesions or rashes.  Adam Chan has no problems obtaining his medication from the pharmacy and remains covered through [employer].  Denies feelings of being down, depressed, or hopeless recently and continues to take his Lexapro.  Continues to smoke marijuana on occasion and consumes alcohol socially.  No current tobacco use.  Declines condoms.  Due for routine dental care.  Has received his COVID booster.   07/18/20 id f/u Patient here with severe sorethroat for 4-7 days No f/c Was having wretching/vomiting when trying to eat with the sorethroat. Reports bilateral ear pressore. No  headache, rash, chest pain Minimal cough here and there No known sick contact Try otc nsaids, spray but not helping much Had to leave work at times for severe sorethroat hiv well controlled Not checking his sugar due to current illness Compliant with hiv meds    Allergies  Allergen Reactions  . Elemental Sulfur Other (See Comments)  . Sulfa Antibiotics Anaphylaxis      Outpatient Medications Prior to Visit  Medication Sig Dispense Refill  . bictegravir-emtricitabine-tenofovir AF (BIKTARVY) 50-200-25 MG TABS tablet Take 1 tablet by mouth daily. 30 tablet 5  . CRESTOR 40 MG tablet TAKE 1 TABLET(40 MG) BY MOUTH DAILY 30 tablet 5  . escitalopram (LEXAPRO) 20 MG tablet TAKE 1 TABLET(20 MG) BY MOUTH DAILY 30 tablet 5  . hydrochlorothiazide (HYDRODIURIL) 25 MG tablet TAKE 1 TABLET(25 MG) BY MOUTH DAILY 30 tablet 5  . INVOKANA 100 MG TABS tablet TAKE 1 TABLET(100 MG) BY MOUTH DAILY BEFORE BREAKFAST 30 tablet 2  . metFORMIN (GLUCOPHAGE) 1000 MG tablet Take 1 tablet by mouth twice daily. 60 tablet 5  . omeprazole (PRILOSEC) 20 MG capsule TAKE 1 CAPSULE(20 MG) BY MOUTH DAILY 30 capsule 5  . ondansetron (ZOFRAN) 8 MG tablet Take 1 tablet by mouth every 8 hours as needed for nausea 30 tablet 5  . Semaglutide,0.25 or 0.5MG /DOS, (OZEMPIC, 0.25 OR 0.5 MG/DOSE,) 2 MG/1.5ML SOPN Inject 0.25 mg subcutaneously weekly x 4 weeks then increase to 0.5 mg weekly. 3 mL 3  . sitaGLIPtin (JANUVIA) 100 MG tablet TAKE 1 TABLET(100 MG) BY MOUTH DAILY 30 tablet 5  . EPINEPHrine 0.3  mg/0.3 mL IJ SOAJ injection Inject 0.3 mg into the muscle once.     No facility-administered medications prior to visit.     Past Medical History:  Diagnosis Date  . Depression 04/06/2018  . Diabetes type 1, controlled (HCC) 04/06/2018  . HIV (human immunodeficiency virus infection) (HCC) 04/06/2018  . Hypertension   . Nausea in adult 04/06/2018     Past Surgical History:  Procedure Laterality Date  . Bilaterla mesh for  Hernia Bilateral 2009  . CHOLECYSTECTOMY         Review of Systems  Constitutional: Negative for appetite change, chills, fatigue, fever and unexpected weight change.  Eyes: Negative for visual disturbance.  Respiratory: Negative for cough, chest tightness, shortness of breath and wheezing.   Cardiovascular: Negative for chest pain and leg swelling.  Gastrointestinal: Negative for abdominal pain, constipation, diarrhea, nausea and vomiting.  Genitourinary: Negative for dysuria, flank pain, frequency, genital sores, hematuria and urgency.  Skin: Negative for rash.  Allergic/Immunologic: Negative for immunocompromised state.  Neurological: Negative for dizziness and headaches.      Objective:    BP (!) 135/99   Pulse (!) 123   Temp 98.1 F (36.7 C) (Oral)   Wt 151 lb (68.5 kg)   BMI 24.37 kg/m  Nursing note and vital signs reviewed.  Physical Exam Constitutional:      General: He is not in acute distress.    Appearance: He is well-developed.  HENT:     Head: Normocephalic.     Mouth/Throat:     Mouth: Mucous membranes are moist.     Pharynx: Oropharynx is clear.     Comments: No oropharyngeal exudate or tonsillar swelling or oropharyngeal erythema Eyes:     Conjunctiva/sclera: Conjunctivae normal.  Cardiovascular:     Rate and Rhythm: Normal rate and regular rhythm.     Heart sounds: Normal heart sounds. No murmur heard. No friction rub. No gallop.   Pulmonary:     Effort: Pulmonary effort is normal. No respiratory distress.     Breath sounds: Normal breath sounds. No wheezing or rales.  Chest:     Chest wall: No tenderness.  Abdominal:     General: Bowel sounds are normal.     Palpations: Abdomen is soft.     Tenderness: There is no abdominal tenderness.  Musculoskeletal:     Cervical back: Neck supple.  Lymphadenopathy:     Cervical: No cervical adenopathy.  Skin:    General: Skin is warm and dry.     Findings: No rash.  Neurological:     Mental Status:  He is alert and oriented to person, place, and time.  Psychiatric:        Behavior: Behavior normal.        Thought Content: Thought content normal.        Judgment: Judgment normal.     Depression screen Adam Chan 2/9 07/18/2020 06/13/2019 06/02/2018  Decreased Interest 0 0 0  Down, Depressed, Hopeless 0 0 0  PHQ - 2 Score 0 0 0       Assessment & Plan:    Patient Active Problem List   Diagnosis Date Noted  . Early syphilis, latent 06/13/2019  . Type 2 diabetes mellitus with hyperglycemia, without long-term current use of insulin (HCC) 06/02/2018  . Recurrent major depressive disorder, in full remission (HCC) 06/02/2018  . Essential hypertension 06/02/2018  . Healthcare maintenance 06/02/2018  . Human immunodeficiency virus (HIV) disease (HCC) 08/02/2017     Problem List Items  Addressed This Visit   None   Visit Diagnoses    Sore throat    -  Primary   HIV disease (HCC)       Relevant Medications   magic mouthwash (nystatin, lidocaine, diphenhydrAMINE, alum & mag hydroxide) suspension   Other Relevant Orders   CBC w/Diff   Comprehensive metabolic panel   HIV 1 RNA quant-no reflex-bld   Type 2 diabetes mellitus without complication, unspecified whether long term insulin use (HCC)       Relevant Orders   HgB A1c     #uri Assure patient not bacterial Continue supportive care Prn lidocaine magic mouthwash and vicodin Letter for off work till 3/16   #hiv Has had blips frequently Suspect not the best compliance  Lab Results  Component Value Date   HIV1RNAQUANT 66 (H) 04/11/2020   Lab Results  Component Value Date   CD4TCELL 41 04/11/2020   CD4TABS 766 04/11/2020   -continue current art -labs today -discuss u=u and need for compliance -f/u 3 months with Tammy Sours  I am having Chauncy Passy maintain his EPINEPHrine, ondansetron, Biktarvy, Crestor, escitalopram, metFORMIN, omeprazole, hydrochlorothiazide, Ozempic (0.25 or 0.5 MG/DOSE), sitaGLIPtin, and  Invokana.   No orders of the defined types were placed in this encounter.    Follow-up: Return in about 3 months (around 10/18/2020).  I spent more than 25 minute reviewing data/chart, and coordinating care and >50% direct face to face time providing counseling/discussing diagnostics/treatment plan with patient   Marcos Eke, MSN, FNP-C Nurse Practitioner [facility] Main number: [phone]

## 2020-07-18 NOTE — Patient Instructions (Signed)
You do have an upper respiratory tract infection but this appears to be a virus cause, not bacteria. Antibiotics do not work for virus.  Please allow another week for the symptoms to resolve Continue taking ibuprofen as needed, use oral spray to help with pain I will also prescribe vicodin to help with the pain as well, for use of 5 days I have also prescribed a magic mouth wash to use for numbing effect of the throat   Get hiv labs today; will also check A1c for your diabetes  Follow up in 3 monts (sometimes end of 10/2020) with Tammy Sours

## 2020-07-18 NOTE — Progress Notes (Signed)
Letter for off work

## 2020-07-22 LAB — CBC WITH DIFFERENTIAL/PLATELET
Absolute Monocytes: 723 cells/uL (ref 200–950)
Basophils Absolute: 32 cells/uL (ref 0–200)
Basophils Relative: 0.5 %
Eosinophils Absolute: 77 cells/uL (ref 15–500)
Eosinophils Relative: 1.2 %
HCT: 49 % (ref 38.5–50.0)
Hemoglobin: 16.6 g/dL (ref 13.2–17.1)
Lymphs Abs: 2355 cells/uL (ref 850–3900)
MCH: 31.4 pg (ref 27.0–33.0)
MCHC: 33.9 g/dL (ref 32.0–36.0)
MCV: 92.8 fL (ref 80.0–100.0)
MPV: 8.7 fL (ref 7.5–12.5)
Monocytes Relative: 11.3 %
Neutro Abs: 3213 cells/uL (ref 1500–7800)
Neutrophils Relative %: 50.2 %
Platelets: 290 10*3/uL (ref 140–400)
RBC: 5.28 10*6/uL (ref 4.20–5.80)
RDW: 11.5 % (ref 11.0–15.0)
Total Lymphocyte: 36.8 %
WBC: 6.4 10*3/uL (ref 3.8–10.8)

## 2020-07-22 LAB — HEMOGLOBIN A1C
Hgb A1c MFr Bld: 11.5 % of total Hgb — ABNORMAL HIGH (ref <5.7)
Mean Plasma Glucose: 283 mg/dL
eAG (mmol/L): 15.7 mmol/L

## 2020-07-22 LAB — HIV-1 RNA QUANT-NO REFLEX-BLD
HIV 1 RNA Quant: 68 Copies/mL — ABNORMAL HIGH
HIV-1 RNA Quant, Log: 1.83 Log cps/mL — ABNORMAL HIGH

## 2020-07-31 ENCOUNTER — Other Ambulatory Visit: Payer: Self-pay

## 2020-07-31 ENCOUNTER — Ambulatory Visit
Admission: RE | Admit: 2020-07-31 | Discharge: 2020-07-31 | Disposition: A | Discharge status: Home / Self Care | Payer: No Typology Code available for payment source | Source: Ambulatory Visit | Attending: Internal Medicine | Admitting: Internal Medicine

## 2020-07-31 ENCOUNTER — Ambulatory Visit (INDEPENDENT_AMBULATORY_CARE_PROVIDER_SITE_OTHER): Payer: Self-pay | Admitting: Internal Medicine

## 2020-07-31 ENCOUNTER — Encounter: Payer: Self-pay | Admitting: Internal Medicine

## 2020-07-31 VITALS — BP 122/88 | HR 109 | Temp 98.4°F | Wt 155.2 lb

## 2020-07-31 DIAGNOSIS — T148XXA Other injury of unspecified body region, initial encounter: Secondary | ICD-10-CM

## 2020-07-31 DIAGNOSIS — R21 Rash and other nonspecific skin eruption: Secondary | ICD-10-CM

## 2020-07-31 DIAGNOSIS — M79662 Pain in left lower leg: Secondary | ICD-10-CM

## 2020-07-31 NOTE — Progress Notes (Signed)
Subjective:    Patient ID: Adam Chan, male    DOB: October 27, 1970, 50 y.o.   MRN: [id number]  Chief Complaint  Patient presents with  . Follow-up    Fall two weeks ago, rash on left leg,      HPI:  Adam Chan is a 50 y.o. male with HIV disease and type 2 diabetes last seen on 01/23/2020 with good adherence and tolerance to his ART regimen of Biktarvy and multiple medications for type 2 diabetes.  Lab work at that time showed a viral load that was undetectable and CD4 count of 850.  Hemoglobin A1c was 10.1.  Most recent blood work completed on 04/11/2020 with viral load remains undetectable and CD4 count of 766.  Kidney function, liver function, electrolytes within normal ranges.  Hemoglobin A1c increased to 11.4.  Here today for routine follow-up.  Adam Chan continues to take his Biktarvy and diabetic medications daily as prescribed with no adverse side effects or missed doses since his last office visit.  Overall feeling well today with no new concerns/complaints.  Requesting refills of Ozempic. Denies fevers, chills, night sweats, headaches, changes in vision, neck pain/stiffness, nausea, diarrhea, vomiting, lesions or rashes.  Adam Chan has no problems obtaining his medication from the pharmacy and remains covered through [employer].  Denies feelings of being down, depressed, or hopeless recently and continues to take his Lexapro.  Continues to smoke marijuana on occasion and consumes alcohol socially.  No current tobacco use.  Declines condoms.  Due for routine dental care.  Has received his COVID booster.   07/18/20 id f/u Patient here with severe sorethroat for 4-7 days No f/c Was having wretching/vomiting when trying to eat with the sorethroat. Reports bilateral ear pressore. No headache, rash, chest pain Minimal cough here and there No known sick contact Try otc nsaids, spray but not helping much Had to leave work at times for severe sorethroat hiv well controlled Not  checking his sugar due to current illness Compliant with hiv meds  07/31/20 Patient is here with 2 issues A diffuse rash that started around the time he developed sore throat. He was seen for sorethroat but didn't tell me a few spots of rash on chest. This spreaded out to now involve the whole trunk/extremities. No itch No new meds/detergent sorethroat is better/less tired He also tripped and hit his left shin bone a couples weeks ago and it hurts a lot even to walk on, still now Mild cough going away  No blood in urine, headache, diarrhea, joint pain/swelling  No recent sexual contact. He doesn't remember when it was cause it has been a long time  Allergies  Allergen Reactions  . Elemental Sulfur Other (See Comments)  . Sulfa Antibiotics Anaphylaxis      Outpatient Medications Prior to Visit  Medication Sig Dispense Refill  . bictegravir-emtricitabine-tenofovir AF (BIKTARVY) 50-200-25 MG TABS tablet Take 1 tablet by mouth daily. 30 tablet 5  . CRESTOR 40 MG tablet TAKE 1 TABLET(40 MG) BY MOUTH DAILY 30 tablet 5  . EPINEPHrine 0.3 mg/0.3 mL IJ SOAJ injection Inject 0.3 mg into the muscle once.    . escitalopram (LEXAPRO) 20 MG tablet TAKE 1 TABLET(20 MG) BY MOUTH DAILY 30 tablet 5  . hydrochlorothiazide (HYDRODIURIL) 25 MG tablet TAKE 1 TABLET(25 MG) BY MOUTH DAILY 30 tablet 5  . INVOKANA 100 MG TABS tablet TAKE 1 TABLET(100 MG) BY MOUTH DAILY BEFORE BREAKFAST 30 tablet 2  . magic mouthwash (nystatin, lidocaine, diphenhydrAMINE, alum &  mag hydroxide) suspension Swish and spit 10 mLs 3 (three) times daily as needed for mouth pain. 180 mL 0  . metFORMIN (GLUCOPHAGE) 1000 MG tablet Take 1 tablet by mouth twice daily. 60 tablet 5  . omeprazole (PRILOSEC) 20 MG capsule TAKE 1 CAPSULE(20 MG) BY MOUTH DAILY 30 capsule 5  . ondansetron (ZOFRAN) 8 MG tablet Take 1 tablet by mouth every 8 hours as needed for nausea 30 tablet 5  . Semaglutide,0.25 or 0.5MG /DOS, (OZEMPIC, 0.25 OR 0.5 MG/DOSE,)  2 MG/1.5ML SOPN Inject 0.25 mg subcutaneously weekly x 4 weeks then increase to 0.5 mg weekly. 3 mL 3  . sitaGLIPtin (JANUVIA) 100 MG tablet TAKE 1 TABLET(100 MG) BY MOUTH DAILY 30 tablet 5   No facility-administered medications prior to visit.     Past Medical History:  Diagnosis Date  . Depression 04/06/2018  . Diabetes type 1, controlled ([place]) 04/06/2018  . HIV (human immunodeficiency virus infection) ([place]) 04/06/2018  . Hypertension   . Nausea in adult 04/06/2018     Past Surgical History:  Procedure Laterality Date  . Bilaterla mesh for Hernia Bilateral 2009  . CHOLECYSTECTOMY       Ros: Negative other ros     Objective:    BP 122/88   Pulse (!) 109   Temp 98.4 F (36.9 C) (Oral)   Wt 155 lb 3.2 oz (70.4 kg)   BMI 25.05 kg/m  Nursing note and vital signs reviewed.  Physical Exam Constitutional:      General: He is not in acute distress.    Appearance: He is well-developed.  HENT:     Head: Normocephalic.     Mouth/Throat:     Mouth: Mucous membranes are moist.     Pharynx: Oropharynx is clear.     Comments: No oropharyngeal exudate or tonsillar swelling or oropharyngeal erythema Eyes:     Conjunctiva/sclera: Conjunctivae normal.  Cardiovascular:     Rate and Rhythm: Normal rate and regular rhythm.     Heart sounds: Normal heart sounds. No murmur heard. No friction rub. No gallop.   Pulmonary:     Effort: Pulmonary effort is normal. No respiratory distress.     Breath sounds: Normal breath sounds. No wheezing or rales.  Chest:     Chest wall: No tenderness.  Abdominal:     General: Bowel sounds are normal.     Palpations: Abdomen is soft.     Tenderness: There is no abdominal tenderness.  Musculoskeletal:     Cervical back: Neck supple.  Lymphadenopathy:     Cervical: No cervical adenopathy.  Skin:    General: Skin is warm and dry.     Findings: Rash present.     Comments: Diffuse nontender maculopapular rash most populated on trunk, and also  proximal extremities; no palm/sole involvement  Neurological:     Mental Status: He is alert and oriented to person, place, and time.  Psychiatric:        Behavior: Behavior normal.        Thought Content: Thought content normal.        Judgment: Judgment normal.     Depression screen [facility] 2/9 07/18/2020 06/13/2019 06/02/2018  Decreased Interest 0 0 0  Down, Depressed, Hopeless 0 0 0  PHQ - 2 Score 0 0 0       Assessment & Plan:    Patient Active Problem List   Diagnosis Date Noted  . Early syphilis, latent 06/13/2019  . Type 2 diabetes mellitus with hyperglycemia,  without long-term current use of insulin ([place]) 06/02/2018  . Recurrent major depressive disorder, in full remission ([place]) 06/02/2018  . Essential hypertension 06/02/2018  . Healthcare maintenance 06/02/2018  . Human immunodeficiency virus (HIV) disease ([place]) 08/02/2017     Problem List Items Addressed This Visit   None   Visit Diagnoses    Rash and nonspecific skin eruption    -  Primary   Contusion of bone         #uri #viral [place] He sees me today as rash is much pronounced. Other uri sx improving.  Hasn't taken aspirin ddx for what appears to be viral [place] ebv/cmv although atypical at this age. Born [place] and should have been vaccinated for [employer] . This is not a tick related process. Will check syphilis  -rpr -cmv/ebv serology -respiratory viral pcr -cbc, cmp   #left shin pain Likely bone contusion, but will r/o fracture  -xray left leg -prn nsaids  -rx for 5 days vicodin 3 times daily as needed; 5/325     Didn't address todays visit ----------------  #hiv Has had blips frequently Suspect not the best compliance  Lab Results  Component Value Date   HIV1RNAQUANT 68 (H) 07/18/2020   Lab Results  Component Value Date   CD4TCELL 41 04/11/2020   CD4TABS 766 04/11/2020   -continue current art -labs today -discuss u=u and need for compliance -f/u 3 months with Marya Amsler  I  am having Renold Genta maintain his EPINEPHrine, ondansetron, Biktarvy, Crestor, escitalopram, metFORMIN, omeprazole, hydrochlorothiazide, Ozempic (0.25 or 0.5 MG/DOSE), sitaGLIPtin, Invokana, and (magic mouthwash (nystatin, lidocaine, diphenhydrAMINE, alum & mag hydroxide) suspension).   No orders of the defined types were placed in this encounter.    Follow-up: No follow-ups on file.  I spent more than 25 minute reviewing data/chart, and coordinating care and >50% direct face to face time providing counseling/discussing diagnostics/treatment plan with patient   Terri Piedra, MSN, [place] for [place] number: [phone]

## 2020-07-31 NOTE — Telephone Encounter (Signed)
Patient denies any new soaps or detergents. States the rash really does not itch at all. Patent has tried OTC medication. Patient placed on the schedule to see Dr. Renold Don today.  Valarie Cones

## 2020-07-31 NOTE — Patient Instructions (Signed)
Your rash is likely related to a virus reaction. It will go away hopefully in another week or so. There is no medication that will make it go away   Will get basic labs to see what it is   Will also get xray of your leg and r/o fracture   Please see me again next week

## 2020-08-01 ENCOUNTER — Telehealth: Payer: Self-pay

## 2020-08-01 NOTE — Telephone Encounter (Signed)
Called patient to discuss RPR titer. Patient requested to be seen sooner then 31st. He was rescheduled to see Dr. Renold Don on the 29th for treatment discussion and initiation. Instructed to abstain from sex and reiterated that it's treatable.  Jadarrius Maselli Loyola Mast, RN

## 2020-08-04 LAB — COMPREHENSIVE METABOLIC PANEL
AG Ratio: 1 (calc) (ref 1.0–2.5)
ALT: 23 U/L (ref 9–46)
AST: 29 U/L (ref 10–35)
Albumin: 4.1 g/dL (ref 3.6–5.1)
Alkaline phosphatase (APISO): 115 U/L (ref 35–144)
BUN/Creatinine Ratio: 15 (calc) (ref 6–22)
BUN: 10 mg/dL (ref 7–25)
CO2: 24 mmol/L (ref 20–32)
Calcium: 9.5 mg/dL (ref 8.6–10.3)
Chloride: 97 mmol/L — ABNORMAL LOW (ref 98–110)
Creat: 0.65 mg/dL — ABNORMAL LOW (ref 0.70–1.33)
Globulin: 4 g/dL (calc) — ABNORMAL HIGH (ref 1.9–3.7)
Glucose, Bld: 325 mg/dL — ABNORMAL HIGH (ref 65–99)
Potassium: 4 mmol/L (ref 3.5–5.3)
Sodium: 134 mmol/L — ABNORMAL LOW (ref 135–146)
Total Bilirubin: 0.4 mg/dL (ref 0.2–1.2)
Total Protein: 8.1 g/dL (ref 6.1–8.1)

## 2020-08-04 LAB — RESPIRATORY VIRUS PANEL

## 2020-08-04 LAB — CMV (CYTOMEGALOVIRUS) DNA ULTRAQUANT, PCR
CMV DNA, QN PCR: NOT DETECTED log IU/mL
CMV DNA, QN Real Time PCR: NOT DETECTED IU/mL

## 2020-08-04 LAB — EPSTEIN-BARR VIRUS VCA ANTIBODY PANEL
EBV NA IgG: >600 U/mL — ABNORMAL HIGH
EBV VCA IgG: >750 U/mL — ABNORMAL HIGH
EBV VCA IgM: <36 U/mL

## 2020-08-04 LAB — RPR: RPR Ser Ql: REACTIVE — AB

## 2020-08-04 LAB — CBC
HCT: 45.4 % (ref 38.5–50.0)
Hemoglobin: 14.8 g/dL (ref 13.2–17.1)
MCH: 30 pg (ref 27.0–33.0)
MCHC: 32.6 g/dL (ref 32.0–36.0)
MCV: 92.1 fL (ref 80.0–100.0)
MPV: 8.6 fL (ref 7.5–12.5)
Platelets: 439 10*3/uL — ABNORMAL HIGH (ref 140–400)
RBC: 4.93 10*6/uL (ref 4.20–5.80)
RDW: 12 % (ref 11.0–15.0)
WBC: 5.2 10*3/uL (ref 3.8–10.8)

## 2020-08-04 LAB — FLUORESCENT TREPONEMAL AB(FTA)-IGG-BLD: Fluorescent Treponemal ABS: REACTIVE — AB

## 2020-08-04 LAB — RPR TITER: RPR Titer: 1:8192 {titer} — ABNORMAL HIGH

## 2020-08-04 LAB — CMV ABS, IGG+IGM (CYTOMEGALOVIRUS)
CMV IgM: <30 AU/mL
Cytomegalovirus Ab-IgG: 5.3 U/mL — ABNORMAL HIGH

## 2020-08-04 LAB — MONONUCLEOSIS SCREEN: Heterophile, Mono Screen: NEGATIVE

## 2020-08-05 ENCOUNTER — Encounter: Payer: Self-pay | Admitting: Internal Medicine

## 2020-08-05 ENCOUNTER — Other Ambulatory Visit: Payer: Self-pay

## 2020-08-05 ENCOUNTER — Ambulatory Visit (INDEPENDENT_AMBULATORY_CARE_PROVIDER_SITE_OTHER): Payer: Self-pay | Admitting: Internal Medicine

## 2020-08-05 VITALS — BP 132/88 | HR 102 | Temp 98.1°F | Wt 154.6 lb

## 2020-08-05 DIAGNOSIS — A5149 Other secondary syphilitic conditions: Secondary | ICD-10-CM

## 2020-08-05 DIAGNOSIS — Z113 Encounter for screening for infections with a predominantly sexual mode of transmission: Secondary | ICD-10-CM

## 2020-08-05 MED ORDER — PENICILLIN G BENZATHINE 1200000 UNIT/2ML IM SUSP
1.2000 10*6.[IU] | Freq: Once | INTRAMUSCULAR | Status: AC
  Administered 2020-08-05: 1.2 10*6.[IU] via INTRAMUSCULAR

## 2020-08-05 NOTE — Addendum Note (Signed)
Addended by: Lorenso Courier on: 08/05/2020 11:19 AM   Modules accepted: Orders

## 2020-08-05 NOTE — Patient Instructions (Signed)
Will start syphilis treatment today. Shots intramuscular with penicillin into your buttock today, and repeat weekly for another 2 times   STD screen today, and to be repeat in 3 months   Follow up with me in 3 months. Nurse visit for antibiotics shots to be arranged separately

## 2020-08-05 NOTE — Progress Notes (Signed)
Subjective:    Patient ID: Adam Chan, male    DOB: 1970/05/14, 50 y.o.   MRN: [id number]  Chief Complaint  Patient presents with  . Follow-up     HPI:  Adam Chan is a 50 y.o. male with HIV disease and type 2 diabetes last seen on 01/23/2020 with good adherence and tolerance to his ART regimen of Biktarvy and multiple medications for type 2 diabetes.  Lab work at that time showed a viral load that was undetectable and CD4 count of 850.  Hemoglobin A1c was 10.1.  Most recent blood work completed on 04/11/2020 with viral load remains undetectable and CD4 count of 766.  Kidney function, liver function, electrolytes within normal ranges.  Hemoglobin A1c increased to 11.4.  Here today for routine follow-up.  Adam Chan continues to take his Biktarvy and diabetic medications daily as prescribed with no adverse side effects or missed doses since his last office visit.  Overall feeling well today with no new concerns/complaints.  Requesting refills of Ozempic. Denies fevers, chills, night sweats, headaches, changes in vision, neck pain/stiffness, nausea, diarrhea, vomiting, lesions or rashes.  Adam Chan has no problems obtaining his medication from the pharmacy and remains covered through [employer].  Denies feelings of being down, depressed, or hopeless recently and continues to take his Lexapro.  Continues to smoke marijuana on occasion and consumes alcohol socially.  No current tobacco use.  Declines condoms.  Due for routine dental care.  Has received his COVID booster.   07/18/20 id f/u Patient here with severe sorethroat for 4-7 days No f/c Was having wretching/vomiting when trying to eat with the sorethroat. Reports bilateral ear pressore. No headache, rash, chest pain Minimal cough here and there No known sick contact Try otc nsaids, spray but not helping much Had to leave work at times for severe sorethroat hiv well controlled Not checking his sugar due to current  illness Compliant with hiv meds  07/31/20 Patient is here with 2 issues A diffuse rash that started around the time he developed sore throat. He was seen for sorethroat but didn't tell me a few spots of rash on chest. This spreaded out to now involve the whole trunk/extremities. No itch No new meds/detergent sorethroat is better/less tired He also tripped and hit his left shin bone a couples weeks ago and it hurts a lot even to walk on, still now Mild cough going away  No blood in urine, headache, diarrhea, joint pain/swelling  No recent sexual contact. He doesn't remember when it was cause it has been a long time    08/05/20 f/u Patient reports last sexual exposure was 6 weeks prior to the onset of sorethroat and rash No visual changes, loss of hearing, double vision, numbness/tingling, focal weakness Still have sorethroat but better. Diffuse maculopapular rash remains Left shin pain still hurts a lot; he is taking vicodin which helps No n/v/rectal pain/stiff neck No penile rash/discharge  Allergies  Allergen Reactions  . Elemental Sulfur Other (See Comments)  . Sulfa Antibiotics Anaphylaxis      Outpatient Medications Prior to Visit  Medication Sig Dispense Refill  . bictegravir-emtricitabine-tenofovir AF (BIKTARVY) 50-200-25 MG TABS tablet Take 1 tablet by mouth daily. 30 tablet 5  . CRESTOR 40 MG tablet TAKE 1 TABLET(40 MG) BY MOUTH DAILY 30 tablet 5  . EPINEPHrine 0.3 mg/0.3 mL IJ SOAJ injection Inject 0.3 mg into the muscle once.    . escitalopram (LEXAPRO) 20 MG tablet TAKE 1 TABLET(20 MG) BY MOUTH  DAILY 30 tablet 5  . hydrochlorothiazide (HYDRODIURIL) 25 MG tablet TAKE 1 TABLET(25 MG) BY MOUTH DAILY 30 tablet 5  . INVOKANA 100 MG TABS tablet TAKE 1 TABLET(100 MG) BY MOUTH DAILY BEFORE BREAKFAST 30 tablet 2  . magic mouthwash (nystatin, lidocaine, diphenhydrAMINE, alum & mag hydroxide) suspension Swish and spit 10 mLs 3 (three) times daily as needed for mouth pain. 180 mL  0  . metFORMIN (GLUCOPHAGE) 1000 MG tablet Take 1 tablet by mouth twice daily. 60 tablet 5  . omeprazole (PRILOSEC) 20 MG capsule TAKE 1 CAPSULE(20 MG) BY MOUTH DAILY 30 capsule 5  . ondansetron (ZOFRAN) 8 MG tablet Take 1 tablet by mouth every 8 hours as needed for nausea 30 tablet 5  . Semaglutide,0.25 or 0.5MG /DOS, (OZEMPIC, 0.25 OR 0.5 MG/DOSE,) 2 MG/1.5ML SOPN Inject 0.25 mg subcutaneously weekly x 4 weeks then increase to 0.5 mg weekly. 3 mL 3  . sitaGLIPtin (JANUVIA) 100 MG tablet TAKE 1 TABLET(100 MG) BY MOUTH DAILY 30 tablet 5   No facility-administered medications prior to visit.     Past Medical History:  Diagnosis Date  . Depression 04/06/2018  . Diabetes type 1, controlled ([facility]) 04/06/2018  . HIV (human immunodeficiency virus infection) ([facility]) 04/06/2018  . Hypertension   . Nausea in adult 04/06/2018     Past Surgical History:  Procedure Laterality Date  . Bilaterla mesh for Hernia Bilateral 2009  . CHOLECYSTECTOMY       Ros: Negative other ros     Objective:    BP 132/88   Pulse (!) 102   Temp 98.1 F (36.7 C) (Oral)   Wt 154 lb 9.6 oz (70.1 kg)   BMI 24.95 kg/m  Nursing note and vital signs reviewed.  Physical Exam Constitutional:      General: He is not in acute distress.    Appearance: He is well-developed.  HENT:     Head: Normocephalic.     Mouth/Throat:     Mouth: Mucous membranes are moist.     Pharynx: Oropharynx is clear.     Comments: No oropharyngeal exudate or tonsillar swelling or oropharyngeal erythema Eyes:     Conjunctiva/sclera: Conjunctivae normal.  Cardiovascular:     Rate and Rhythm: Normal rate and regular rhythm.     Heart sounds: Normal heart sounds. No murmur heard. No friction rub. No gallop.   Pulmonary:     Effort: Pulmonary effort is normal. No respiratory distress.     Breath sounds: Normal breath sounds. No wheezing or rales.  Chest:     Chest wall: No tenderness.  Abdominal:     General: Bowel sounds are  normal.     Palpations: Abdomen is soft.     Tenderness: There is no abdominal tenderness.  Musculoskeletal:     Cervical back: Neck supple.  Lymphadenopathy:     Cervical: No cervical adenopathy.  Skin:    General: Skin is warm and dry.     Findings: Rash present.     Comments: Diffuse nontender maculopapular rash most populated on trunk, and also proximal extremities; no palm/sole involvement  Neurological:     Mental Status: He is alert and oriented to person, place, and time.  Psychiatric:        Behavior: Behavior normal.        Thought Content: Thought content normal.        Judgment: Judgment normal.     Depression screen [facility] 2/9 07/18/2020 06/13/2019 06/02/2018  Decreased Interest 0 0 0  Down, Depressed, Hopeless 0 0 0  PHQ - 2 Score 0 0 0       Assessment & Plan:    Patient Active Problem List   Diagnosis Date Noted  . Early syphilis, latent 06/13/2019  . Type 2 diabetes mellitus with hyperglycemia, without long-term current use of insulin ([facility]) 06/02/2018  . Recurrent major depressive disorder, in full remission ([facility]) 06/02/2018  . Essential hypertension 06/02/2018  . Healthcare maintenance 06/02/2018  . Human immunodeficiency virus (HIV) disease ([facility]) 08/02/2017     Problem List Items Addressed This Visit   None    #Secondary syphilis No cns/ophthalmologic/otic sx I reviewed literature and discussed history of syphilis with patient From literature, rpr titer alone without cns sx doesn necessitate deviating from treatment of syphilis according to stage There is usually nonspecific csf changes in early syphilis but of unclear significance  I would treat for 3 weekly im pcn though  -benzathine penicillin g IM 2.4 million units weekly x3; first shot today -fu nurse visit for the other 2 weekly shots -check gc/chlamydia and hepatitis serology today and in 3 months; repeat rpr at that time as well -discuss/encourage safe sexual practice  #left shin  pain Likely bone contusion, but will r/o fracture -- xray negative for osseous involvmeent -prn vicodin helps -- continue    Didn't address todays visit ----------------  #hiv Has had blips frequently Suspect not the best compliance  Lab Results  Component Value Date   HIV1RNAQUANT 68 (H) 07/18/2020   Lab Results  Component Value Date   CD4TCELL 41 04/11/2020   CD4TABS 766 04/11/2020   -continue current art -labs today -discuss u=u and need for compliance -f/u 3 months with Tammy Sours  I am having Chauncy Passy maintain his EPINEPHrine, ondansetron, Biktarvy, Crestor, escitalopram, metFORMIN, omeprazole, hydrochlorothiazide, Ozempic (0.25 or 0.5 MG/DOSE), sitaGLIPtin, Invokana, and (magic mouthwash (nystatin, lidocaine, diphenhydrAMINE, alum & mag hydroxide) suspension).   No orders of the defined types were placed in this encounter.    Follow-up: No follow-ups on file.  I spent more than 25 minute reviewing data/chart, and coordinating care and >50% direct face to face time providing counseling/discussing diagnostics/treatment plan with patient   08/05/2020, 10:59 AM      Raymondo Band, MD [facility] for Infectious Disease [facility] [phone]  pager   [phone] cell 06/06/2020, 2:34 PM

## 2020-08-05 NOTE — Addendum Note (Signed)
Addended by: Harley Alto on: 08/05/2020 05:34 PM   Modules accepted: Orders

## 2020-08-06 LAB — HEPATITIS A ANTIBODY, TOTAL: Hepatitis A AB,Total: REACTIVE — AB

## 2020-08-06 LAB — HEPATITIS C ANTIBODY
Hepatitis C Ab: NONREACTIVE
SIGNAL TO CUT-OFF: 0.04 (ref <1.00)

## 2020-08-06 LAB — HEPATITIS B SURFACE ANTIGEN: Hepatitis B Surface Ag: NONREACTIVE

## 2020-08-06 LAB — HEPATITIS B SURFACE ANTIBODY, QUANTITATIVE: Hep B S AB Quant (Post): >1000 m[IU]/mL (ref >=10)

## 2020-08-06 LAB — HEPATITIS B CORE ANTIBODY, TOTAL: Hep B Core Total Ab: NONREACTIVE

## 2020-08-07 ENCOUNTER — Ambulatory Visit: Payer: Self-pay | Admitting: Family

## 2020-08-11 ENCOUNTER — Other Ambulatory Visit: Payer: Self-pay

## 2020-08-12 ENCOUNTER — Other Ambulatory Visit: Payer: Self-pay

## 2020-08-12 ENCOUNTER — Ambulatory Visit (INDEPENDENT_AMBULATORY_CARE_PROVIDER_SITE_OTHER): Payer: Self-pay

## 2020-08-12 DIAGNOSIS — A5149 Other secondary syphilitic conditions: Secondary | ICD-10-CM

## 2020-08-12 MED ORDER — PENICILLIN G BENZATHINE 1200000 UNIT/2ML IM SUSP
1.2000 10*6.[IU] | Freq: Once | INTRAMUSCULAR | Status: AC
  Administered 2020-08-12: 1.2 10*6.[IU] via INTRAMUSCULAR

## 2020-08-12 NOTE — Progress Notes (Signed)
Reviewed and verified allergies with patient. Patient tolerated Bicillin injections well. Reinforced abstinence until treatment completed plus an additional 10 days, offered condoms and encouraged use. Advised patient to notify sexual partners for testing and treatment. Patient verbalized understanding.   Shaneal Barasch D Arly Salminen, RN  

## 2020-08-20 ENCOUNTER — Other Ambulatory Visit: Payer: Self-pay

## 2020-08-20 ENCOUNTER — Ambulatory Visit (INDEPENDENT_AMBULATORY_CARE_PROVIDER_SITE_OTHER): Payer: Self-pay

## 2020-08-20 DIAGNOSIS — A515 Early syphilis, latent: Secondary | ICD-10-CM

## 2020-08-20 MED ORDER — PENICILLIN G BENZATHINE 1200000 UNIT/2ML IM SUSP
1.2000 10*6.[IU] | Freq: Once | INTRAMUSCULAR | Status: AC
  Administered 2020-08-20: 1.2 10*6.[IU] via INTRAMUSCULAR

## 2020-08-25 ENCOUNTER — Encounter: Payer: Self-pay | Admitting: Family

## 2020-08-28 ENCOUNTER — Encounter: Payer: Self-pay | Admitting: Family

## 2020-09-16 ENCOUNTER — Other Ambulatory Visit (HOSPITAL_COMMUNITY): Payer: Self-pay

## 2020-09-19 ENCOUNTER — Telehealth: Payer: Self-pay

## 2020-09-19 ENCOUNTER — Other Ambulatory Visit: Payer: Self-pay | Admitting: Family

## 2020-09-19 DIAGNOSIS — E1165 Type 2 diabetes mellitus with hyperglycemia: Secondary | ICD-10-CM

## 2020-09-19 MED ORDER — SEMAGLUTIDE (1 MG/DOSE) 4 MG/3ML ~~LOC~~ SOPN: 1.0000 mg | PEN_INJECTOR | SUBCUTANEOUS | 12 refills | Status: DC

## 2020-09-19 NOTE — Telephone Encounter (Signed)
[facility] Patient Advocate Encounter  Completed and sent [employer]  application for [employer] for this patient who is uninsured.    Patient assistance phone number for follow up is [phone].   I also faxed a new script for the patient for Ozempic 1mg  .   This encounter will be updated until final determination.   , CPhT Specialty Pharmacy Patient [facility] for Infectious Disease Phone: [phone] Fax:  [phone]

## 2020-09-23 ENCOUNTER — Other Ambulatory Visit: Payer: Self-pay | Admitting: Family

## 2020-09-23 ENCOUNTER — Telehealth: Payer: Self-pay

## 2020-09-23 DIAGNOSIS — E1165 Type 2 diabetes mellitus with hyperglycemia: Secondary | ICD-10-CM

## 2020-09-23 NOTE — Telephone Encounter (Signed)
[facility] Patient Advocate Encounter  Completed and sent [employer] application for [facility] for this patient who is uninsured.    Patient is approved 09/18/20 through 09/18/21.  Medication will be delivered to the clinic within 10-14 days.     Clearance Coots, CPhT Specialty Pharmacy Patient [facility] for Infectious Disease Phone: [phone] Fax:  [phone]

## 2020-10-20 ENCOUNTER — Ambulatory Visit: Payer: Self-pay | Admitting: Family

## 2020-10-20 ENCOUNTER — Other Ambulatory Visit: Payer: Self-pay | Admitting: Family

## 2020-10-20 ENCOUNTER — Ambulatory Visit: Payer: Self-pay | Admitting: Internal Medicine

## 2020-10-20 DIAGNOSIS — B20 Human immunodeficiency virus [HIV] disease: Secondary | ICD-10-CM

## 2020-10-21 ENCOUNTER — Other Ambulatory Visit: Payer: Self-pay

## 2020-10-21 DIAGNOSIS — B20 Human immunodeficiency virus [HIV] disease: Secondary | ICD-10-CM

## 2020-10-21 DIAGNOSIS — Z113 Encounter for screening for infections with a predominantly sexual mode of transmission: Secondary | ICD-10-CM

## 2020-10-22 ENCOUNTER — Telehealth: Payer: Self-pay

## 2020-10-22 NOTE — Telephone Encounter (Signed)
[facility] Patient Advocate Encounter  Patient's medication (Ozempic 3 month supply) have been couriered to [facility] from [place] and will be picked up 10/23/20.  Clearance Coots , CPhT Specialty Pharmacy Patient [facility] for Infectious Disease Phone: [phone] Fax:  [phone]

## 2020-10-23 ENCOUNTER — Other Ambulatory Visit: Payer: Self-pay

## 2020-10-23 ENCOUNTER — Other Ambulatory Visit: Payer: Self-pay | Admitting: Family

## 2020-10-23 DIAGNOSIS — Z113 Encounter for screening for infections with a predominantly sexual mode of transmission: Secondary | ICD-10-CM

## 2020-10-23 DIAGNOSIS — E1165 Type 2 diabetes mellitus with hyperglycemia: Secondary | ICD-10-CM

## 2020-10-23 DIAGNOSIS — B20 Human immunodeficiency virus [HIV] disease: Secondary | ICD-10-CM

## 2020-10-24 LAB — T-HELPER CELL (CD4) - (RCID CLINIC ONLY)
CD4 % Helper T Cell: 45 % (ref 33–65)
CD4 T Cell Abs: 1414 /uL (ref 400–1790)

## 2020-10-27 LAB — CBC WITH DIFFERENTIAL/PLATELET
Absolute Monocytes: 456 cells/uL (ref 200–950)
Basophils Absolute: 18 cells/uL (ref 0–200)
Basophils Relative: 0.3 %
Eosinophils Absolute: 90 cells/uL (ref 15–500)
Eosinophils Relative: 1.5 %
HCT: 43.4 % (ref 38.5–50.0)
Hemoglobin: 14.4 g/dL (ref 13.2–17.1)
Lymphs Abs: 3246 cells/uL (ref 850–3900)
MCH: 29.2 pg (ref 27.0–33.0)
MCHC: 33.2 g/dL (ref 32.0–36.0)
MCV: 88 fL (ref 80.0–100.0)
MPV: 8.7 fL (ref 7.5–12.5)
Monocytes Relative: 7.6 %
Neutro Abs: 2190 cells/uL (ref 1500–7800)
Neutrophils Relative %: 36.5 %
Platelets: 279 10*3/uL (ref 140–400)
RBC: 4.93 10*6/uL (ref 4.20–5.80)
RDW: 14.2 % (ref 11.0–15.0)
Total Lymphocyte: 54.1 %
WBC: 6 10*3/uL (ref 3.8–10.8)

## 2020-10-27 LAB — RPR TITER: RPR Titer: 1:128 {titer} — ABNORMAL HIGH

## 2020-10-27 LAB — COMPLETE METABOLIC PANEL WITH GFR
AG Ratio: 1.7 (calc) (ref 1.0–2.5)
ALT: 21 U/L (ref 9–46)
AST: 22 U/L (ref 10–35)
Albumin: 4.5 g/dL (ref 3.6–5.1)
Alkaline phosphatase (APISO): 88 U/L (ref 35–144)
BUN: 20 mg/dL (ref 7–25)
CO2: 26 mmol/L (ref 20–32)
Calcium: 9.6 mg/dL (ref 8.6–10.3)
Chloride: 94 mmol/L — ABNORMAL LOW (ref 98–110)
Creat: 0.98 mg/dL (ref 0.70–1.33)
GFR, Est African American: 102 mL/min/{1.73_m2} (ref >=60)
GFR, Est Non African American: 88 mL/min/{1.73_m2} (ref >=60)
Globulin: 2.6 g/dL (calc) (ref 1.9–3.7)
Glucose, Bld: 273 mg/dL — ABNORMAL HIGH (ref 65–99)
Potassium: 3.6 mmol/L (ref 3.5–5.3)
Sodium: 132 mmol/L — ABNORMAL LOW (ref 135–146)
Total Bilirubin: 1 mg/dL (ref 0.2–1.2)
Total Protein: 7.1 g/dL (ref 6.1–8.1)

## 2020-10-27 LAB — HIV-1 RNA QUANT-NO REFLEX-BLD
HIV 1 RNA Quant: 29 Copies/mL — ABNORMAL HIGH
HIV-1 RNA Quant, Log: 1.46 Log cps/mL — ABNORMAL HIGH

## 2020-10-27 LAB — RPR: RPR Ser Ql: REACTIVE — AB

## 2020-10-27 LAB — FLUORESCENT TREPONEMAL AB(FTA)-IGG-BLD: Fluorescent Treponemal ABS: REACTIVE — AB

## 2020-10-27 LAB — HEMOGLOBIN A1C
Hgb A1c MFr Bld: 11.2 % of total Hgb — ABNORMAL HIGH (ref <5.7)
Mean Plasma Glucose: 275 mg/dL
eAG (mmol/L): 15.2 mmol/L

## 2020-10-28 LAB — URINE CYTOLOGY ANCILLARY ONLY
Chlamydia: NEGATIVE
Comment: NEGATIVE
Comment: NORMAL
Neisseria Gonorrhea: NEGATIVE

## 2020-11-09 ENCOUNTER — Other Ambulatory Visit: Payer: Self-pay | Admitting: Family

## 2020-11-09 DIAGNOSIS — E1165 Type 2 diabetes mellitus with hyperglycemia: Secondary | ICD-10-CM

## 2020-11-18 ENCOUNTER — Ambulatory Visit: Payer: Self-pay

## 2020-11-18 ENCOUNTER — Other Ambulatory Visit: Payer: Self-pay

## 2020-11-18 ENCOUNTER — Ambulatory Visit: Payer: Self-pay | Admitting: Family

## 2020-11-18 ENCOUNTER — Encounter: Payer: Self-pay | Admitting: Family

## 2020-11-18 VITALS — BP 127/83 | HR 86 | Temp 98.0°F | Wt 160.0 lb

## 2020-11-18 DIAGNOSIS — Z0000 Encounter for general adult medical examination without abnormal findings: Secondary | ICD-10-CM

## 2020-11-18 DIAGNOSIS — Z79899 Other long term (current) drug therapy: Secondary | ICD-10-CM

## 2020-11-18 DIAGNOSIS — A539 Syphilis, unspecified: Secondary | ICD-10-CM | POA: Insufficient documentation

## 2020-11-18 DIAGNOSIS — E1165 Type 2 diabetes mellitus with hyperglycemia: Secondary | ICD-10-CM

## 2020-11-18 DIAGNOSIS — B20 Human immunodeficiency virus [HIV] disease: Secondary | ICD-10-CM

## 2020-11-18 DIAGNOSIS — A5149 Other secondary syphilitic conditions: Secondary | ICD-10-CM

## 2020-11-18 MED ORDER — CANAGLIFLOZIN 100 MG PO TABS: ORAL_TABLET | ORAL | 5 refills | Status: DC

## 2020-11-18 MED ORDER — SITAGLIPTIN PHOSPHATE 100 MG PO TABS: ORAL_TABLET | ORAL | 5 refills | Status: DC

## 2020-11-18 MED ORDER — BIKTARVY 50-200-25 MG PO TABS: 1.0000 | ORAL_TABLET | Freq: Every day | ORAL | 5 refills | Status: DC

## 2020-11-18 NOTE — Assessment & Plan Note (Signed)
Adam Chan continues to have poorly controlled diabetes with hemoglobin A1c of 11.2 despite 3 oral medications and Ozempic. Does not have any current hyperglycemic symptoms. Discussed importance of decreasing blood sugar to reduce risk of cardiovascular, renal, and neurological complications in the future. I have increased the Ozempic to 1 mg weekly and will continue with metformin, Invokana and Januvia. Discussed the next option would like be a long acting insulin. He will work on [employer]. May need referral to endocrinology if no improvements. Plan for follow up in  3 months or sooner if needed.

## 2020-11-18 NOTE — Assessment & Plan Note (Deleted)
RPR titer remains serofast at 1: 128 down from previous treatment of 1: 8192.  No current symptoms.  Continue to monitor. 

## 2020-11-18 NOTE — Progress Notes (Signed)
Patient ID: Adam Chan, male    DOB: 11/21/1970, 50 y.o.   MRN: [id number]  Subjective:    Chief Complaint  Patient presents with   Follow-up   HPI:  Adam Chan is a 50 y.o. male with HIV disease and Type 2 diabetes last seen on 08/05/20 by Dr. Renold Don with well controlled virus and good adherence and tolerance to his ART regimen of Biktarvy. Viral load was undetectable. Hemoglobin A1c of 11.5. Most recent lab work on 10/23/20 with viral load undetectable, CD4 count 1414 and A1c of 11.2 RPR remains stable at 1:128 down from treatment at 1:8,192. Here today for follow up.  [name] continues to take his medications as prescribed with no adverse side effects. Overall feeling well today with concern for his A1c levels. Denies fevers, chills, night sweats, headaches, changes in vision, neck pain/stiffness, nausea, diarrhea, vomiting, lesions, rashes, polydipsia or polyuria.  [name] has no problems obtaining medications from the pharmacy and remains covered through [employer].  Denies feelings of being down, depressed, or hopeless recently.  Continues to smoke marijuana as needed for nausea and drinks alcohol socially with no current tobacco use.  Declines condoms.  Allergies  Allergen Reactions   Elemental Sulfur Other (See Comments)   Sulfa Antibiotics Anaphylaxis      Outpatient Medications Prior to Visit  Medication Sig Dispense Refill   CRESTOR 40 MG tablet TAKE 1 TABLET(40 MG) BY MOUTH DAILY 30 tablet 5   EPINEPHrine 0.3 mg/0.3 mL IJ SOAJ injection Inject 0.3 mg into the muscle once.     escitalopram (LEXAPRO) 20 MG tablet TAKE 1 TABLET(20 MG) BY MOUTH DAILY 30 tablet 5   hydrochlorothiazide (HYDRODIURIL) 25 MG tablet TAKE 1 TABLET(25 MG) BY MOUTH DAILY 30 tablet 5   magic mouthwash (nystatin, lidocaine, diphenhydrAMINE, alum & mag hydroxide) suspension Swish and spit 10 mLs 3 (three) times daily as needed for mouth pain. 180 mL 0   metFORMIN (GLUCOPHAGE) 1000 MG tablet TAKE  1 TABLET BY MOUTH TWICE DAILY 60 tablet 5   omeprazole (PRILOSEC) 20 MG capsule TAKE 1 CAPSULE(20 MG) BY MOUTH DAILY 30 capsule 5   ondansetron (ZOFRAN) 8 MG tablet Take 1 tablet by mouth every 8 hours as needed for nausea 30 tablet 5   Semaglutide, 1 MG/DOSE, 4 MG/3ML SOPN Inject 1 mg into the skin once a week. 3 mL 12   bictegravir-emtricitabine-tenofovir AF (BIKTARVY) 50-200-25 MG TABS tablet Take 1 tablet by mouth daily. 30 tablet 5   INVOKANA 100 MG TABS tablet TAKE 1 TABLET(100 MG) BY MOUTH DAILY BEFORE BREAKFAST 30 tablet 5   sitaGLIPtin (JANUVIA) 100 MG tablet TAKE 1 TABLET(100 MG) BY MOUTH DAILY 30 tablet 5   No facility-administered medications prior to visit.     Past Medical History:  Diagnosis Date   Depression 04/06/2018   Diabetes type 1, controlled (HCC) 04/06/2018   HIV (human immunodeficiency virus infection) (HCC) 04/06/2018   Hypertension    Nausea in adult 04/06/2018     Past Surgical History:  Procedure Laterality Date   Bilaterla mesh for Hernia Bilateral 2009   CHOLECYSTECTOMY         Review of Systems  Constitutional:  Negative for appetite change, chills, fatigue, fever and unexpected weight change.  Eyes:  Negative for visual disturbance.       Negative for changes in vision.  Respiratory:  Negative for cough, chest tightness, shortness of breath and wheezing.   Cardiovascular:  Negative for chest pain, palpitations and leg  swelling.  Gastrointestinal:  Negative for abdominal pain, constipation, diarrhea, nausea and vomiting.  Endocrine: Negative for polydipsia, polyphagia and polyuria.  Genitourinary:  Negative for dysuria, flank pain, frequency, genital sores, hematuria and urgency.  Skin:  Negative for rash.  Allergic/Immunologic: Negative for immunocompromised state.  Neurological:  Negative for dizziness, weakness, light-headedness and headaches.     Objective:    BP 127/83   Pulse 86   Temp 98 F (36.7 C) (Oral)   Wt 160 lb (72.6 kg)    SpO2 95%   BMI 25.82 kg/m  Nursing note and vital signs reviewed.  Physical Exam Constitutional:      General: He is not in acute distress.    Appearance: He is well-developed.  Eyes:     Conjunctiva/sclera: Conjunctivae normal.  Cardiovascular:     Rate and Rhythm: Normal rate and regular rhythm.     Heart sounds: Normal heart sounds. No murmur heard.   No friction rub. No gallop.  Pulmonary:     Effort: Pulmonary effort is normal. No respiratory distress.     Breath sounds: Normal breath sounds. No wheezing or rales.  Chest:     Chest wall: No tenderness.  Abdominal:     General: Bowel sounds are normal.     Palpations: Abdomen is soft.     Tenderness: There is no abdominal tenderness.  Musculoskeletal:     Cervical back: Neck supple.  Lymphadenopathy:     Cervical: No cervical adenopathy.  Skin:    General: Skin is warm and dry.     Findings: No rash.  Neurological:     Mental Status: He is alert and oriented to person, place, and time.  Psychiatric:        Behavior: Behavior normal.        Thought Content: Thought content normal.        Judgment: Judgment normal.     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 continues to have poorly  controlled diabetes with hemoglobin A1c of 11.2 despite 3 oral medications and Ozempic. Does not have any current hyperglycemic symptoms. Discussed importance of decreasing blood sugar to reduce risk of cardiovascular, renal, and neurological complications in the future. I have increased the Ozempic to 1 mg weekly and will continue with metformin, Invokana and Januvia. Discussed the next option would like be a long acting insulin. He will work on [employer]. May need referral to endocrinology if no improvements. Plan for follow up in  3 months or sooner if needed.        Relevant Medications   canagliflozin (INVOKANA) 100 MG TABS tablet   sitaGLIPtin (JANUVIA) 100 MG tablet   Other Relevant Orders   HgB  A1c   Lipid panel     Other   Human immunodeficiency virus (HIV) disease (HCC) - Primary    Mr. Berenguer continues to have well-controlled HIV disease with good adherence and tolerance to his ART regimen of Biktarvy.  No signs/symptoms of opportunistic infection or progressive HIV.  Given his poorly controlled diabetes I am concerned he has exponential increase for cardiovascular, renal, and neurological complications.  Continue current dose of Biktarvy.  Renew financial assistance.  Plan for follow-up in 3 months or sooner if needed with lab work 1 to 2 weeks prior to appointment.       Relevant Medications   bictegravir-emtricitabine-tenofovir AF (BIKTARVY) 50-200-25 MG TABS tablet   Other Relevant Orders   T-helper cell (CD4)- ([facility] clinic only)   Comprehensive metabolic panel   HIV-1 RNA quant-no reflex-bld   Healthcare maintenance    Discussed importance of safe sexual practice to reduce risk of STI.  Condoms declined.       Secondary syphilis    RPR titer remains serofast at 1: 128 down from previous treatment of 1: 8192.  No current symptoms.  Continue to monitor.       Relevant Medications   bictegravir-emtricitabine-tenofovir AF (BIKTARVY) 50-200-25 MG TABS tablet    Other Relevant Orders   RPR   Other Visit Diagnoses     Pharmacologic therapy       Relevant Orders   Lipid panel        I have changed Reedy Mcinnis's Invokana to canagliflozin. I am also having him maintain his EPINEPHrine, ondansetron, Crestor, escitalopram, omeprazole, hydrochlorothiazide, (magic mouthwash (nystatin, lidocaine, diphenhydrAMINE, alum & mag hydroxide) suspension), Semaglutide (1 MG/DOSE), metFORMIN, Biktarvy, and sitaGLIPtin.   Meds ordered this encounter  Medications   bictegravir-emtricitabine-tenofovir AF (BIKTARVY) 50-200-25 MG TABS tablet    Sig: Take 1 tablet by mouth daily.    Dispense:  30 tablet    Refill:  5    Order Specific Question:   Supervising Provider    Answer:   Drue Second, CYNTHIA [[id number]]   canagliflozin (INVOKANA) 100 MG TABS tablet    Sig: TAKE 1 TABLET(100 MG) BY MOUTH DAILY BEFORE BREAKFAST    Dispense:  30 tablet    Refill:  5    Order Specific Question:   Supervising Provider    Answer:   Drue Second, CYNTHIA [[id number]]   sitaGLIPtin (JANUVIA) 100 MG tablet    Sig: TAKE 1 TABLET(100 MG) BY MOUTH DAILY    Dispense:  30 tablet    Refill:  5    Order Specific Question:   Supervising Provider    Answer:   Judyann Munson [[id number]]     Follow-up: Return in about 3 months (around 02/18/2021), or if symptoms worsen or fail to improve.   Marcos Eke, MSN, FNP-C Nurse Practitioner [facility] for Infectious Disease [facility] Main number: [phone]

## 2020-11-18 NOTE — Assessment & Plan Note (Signed)
RPR titer remains serofast at 1: 128 down from previous treatment of 1: 8192.  No current symptoms.  Continue to monitor.

## 2020-11-18 NOTE — Assessment & Plan Note (Signed)
   Discussed importance of safe sexual practice to reduce risk of STI.  Condoms declined. 

## 2020-11-18 NOTE — Assessment & Plan Note (Signed)
Mr. Moorhouse continues to have well-controlled HIV disease with good adherence and tolerance to his ART regimen of Biktarvy.  No signs/symptoms of opportunistic infection or progressive HIV.  Given his poorly controlled diabetes I am concerned he has exponential increase for cardiovascular, renal, and neurological complications.  Continue current dose of Biktarvy.  Renew financial assistance.  Plan for follow-up in 3 months or sooner if needed with lab work 1 to 2 weeks prior to appointment.

## 2020-11-18 NOTE — Patient Instructions (Signed)
Nice to see you.  We will continue your medications as prescribed.  Refills have been sent to the pharmacy.  Plan for follow up in 3 months or sooner if needed with lab work 1-2 weeks prior to appointment.   Have a great day and stay safe!

## 2020-11-22 ENCOUNTER — Other Ambulatory Visit: Payer: Self-pay | Admitting: Family

## 2020-11-22 DIAGNOSIS — F3342 Major depressive disorder, recurrent, in full remission: Secondary | ICD-10-CM

## 2021-01-01 ENCOUNTER — Encounter: Payer: Self-pay | Admitting: Family

## 2021-01-09 ENCOUNTER — Telehealth: Payer: Self-pay

## 2021-01-09 NOTE — Telephone Encounter (Signed)
[facility] Patient Advocate Encounter  Patient's medications have been couriered to [facility] from [facility] pharmacy and will be picked up 01/13/21.  Clearance Coots , CPhT Specialty Pharmacy Patient [facility] for Infectious Disease Phone: [phone] Fax:  [phone]

## 2021-01-11 ENCOUNTER — Other Ambulatory Visit: Payer: Self-pay | Admitting: Family

## 2021-01-11 DIAGNOSIS — B20 Human immunodeficiency virus [HIV] disease: Secondary | ICD-10-CM

## 2021-01-14 NOTE — Telephone Encounter (Signed)
Please advise regarding refill.  

## 2021-01-16 NOTE — Telephone Encounter (Signed)
Patient arrived at [facility] to pick up his ozempic. He did not bring a cooler, but states he is going directly  home and will be there in 5 minutes or so. Andree Coss, RN

## 2021-02-02 ENCOUNTER — Other Ambulatory Visit: Payer: Self-pay

## 2021-02-02 DIAGNOSIS — Z79899 Other long term (current) drug therapy: Secondary | ICD-10-CM

## 2021-02-02 DIAGNOSIS — A5149 Other secondary syphilitic conditions: Secondary | ICD-10-CM

## 2021-02-02 DIAGNOSIS — B20 Human immunodeficiency virus [HIV] disease: Secondary | ICD-10-CM

## 2021-02-02 DIAGNOSIS — E1165 Type 2 diabetes mellitus with hyperglycemia: Secondary | ICD-10-CM

## 2021-02-03 LAB — T-HELPER CELL (CD4) - (RCID CLINIC ONLY)
CD4 % Helper T Cell: 39 % (ref 33–65)
CD4 T Cell Abs: 667 /uL (ref 400–1790)

## 2021-02-04 ENCOUNTER — Encounter: Payer: Self-pay | Admitting: Infectious Diseases

## 2021-02-04 ENCOUNTER — Other Ambulatory Visit: Payer: Self-pay

## 2021-02-04 ENCOUNTER — Ambulatory Visit: Payer: Self-pay | Admitting: Infectious Diseases

## 2021-02-04 ENCOUNTER — Ambulatory Visit (INDEPENDENT_AMBULATORY_CARE_PROVIDER_SITE_OTHER): Payer: Self-pay | Admitting: Infectious Diseases

## 2021-02-04 VITALS — BP 145/88 | HR 83 | Temp 98.2°F | Wt 154.0 lb

## 2021-02-04 DIAGNOSIS — R21 Rash and other nonspecific skin eruption: Secondary | ICD-10-CM

## 2021-02-04 DIAGNOSIS — K6289 Other specified diseases of anus and rectum: Secondary | ICD-10-CM

## 2021-02-04 DIAGNOSIS — Z23 Encounter for immunization: Secondary | ICD-10-CM

## 2021-02-04 DIAGNOSIS — A5149 Other secondary syphilitic conditions: Secondary | ICD-10-CM

## 2021-02-04 DIAGNOSIS — R6889 Other general symptoms and signs: Secondary | ICD-10-CM

## 2021-02-04 HISTORY — DX: Other specified diseases of anus and rectum: K62.89

## 2021-02-04 LAB — COMPREHENSIVE METABOLIC PANEL
AG Ratio: 1.1 (calc) (ref 1.0–2.5)
ALT: 25 U/L (ref 9–46)
AST: 24 U/L (ref 10–35)
Albumin: 4.4 g/dL (ref 3.6–5.1)
Alkaline phosphatase (APISO): 90 U/L (ref 35–144)
BUN: 15 mg/dL (ref 7–25)
CO2: 25 mmol/L (ref 20–32)
Calcium: 10 mg/dL (ref 8.6–10.3)
Chloride: 96 mmol/L — ABNORMAL LOW (ref 98–110)
Creat: 0.84 mg/dL (ref 0.70–1.30)
Globulin: 4.1 g/dL (calc) — ABNORMAL HIGH (ref 1.9–3.7)
Glucose, Bld: 218 mg/dL — ABNORMAL HIGH (ref 65–99)
Potassium: 3.6 mmol/L (ref 3.5–5.3)
Sodium: 133 mmol/L — ABNORMAL LOW (ref 135–146)
Total Bilirubin: 0.8 mg/dL (ref 0.2–1.2)
Total Protein: 8.5 g/dL — ABNORMAL HIGH (ref 6.1–8.1)

## 2021-02-04 LAB — LIPID PANEL
Cholesterol: 170 mg/dL (ref <200)
HDL: 45 mg/dL (ref >=40)
LDL Cholesterol (Calc): 108 mg/dL (calc) — ABNORMAL HIGH
Non-HDL Cholesterol (Calc): 125 mg/dL (calc) (ref <130)
Total CHOL/HDL Ratio: 3.8 (calc) (ref <5.0)
Triglycerides: 84 mg/dL (ref <150)

## 2021-02-04 LAB — RPR: RPR Ser Ql: REACTIVE — AB

## 2021-02-04 LAB — HIV-1 RNA QUANT-NO REFLEX-BLD
HIV 1 RNA Quant: 52 Copies/mL — ABNORMAL HIGH
HIV-1 RNA Quant, Log: 1.72 Log cps/mL — ABNORMAL HIGH

## 2021-02-04 LAB — HEMOGLOBIN A1C
Hgb A1c MFr Bld: 7.9 % of total Hgb — ABNORMAL HIGH (ref <5.7)
Mean Plasma Glucose: 180 mg/dL
eAG (mmol/L): 10 mmol/L

## 2021-02-04 LAB — RPR TITER: RPR Titer: 1:512 {titer} — ABNORMAL HIGH

## 2021-02-04 LAB — FLUORESCENT TREPONEMAL AB(FTA)-IGG-BLD: Fluorescent Treponemal ABS: REACTIVE — AB

## 2021-02-04 MED ORDER — PENICILLIN G BENZATHINE 1200000 UNIT/2ML IM SUSY
1.2000 10*6.[IU] | PREFILLED_SYRINGE | Freq: Once | INTRAMUSCULAR | Status: AC
  Administered 2021-02-04: 1.2 10*6.[IU] via INTRAMUSCULAR

## 2021-02-04 NOTE — Progress Notes (Signed)
Subjective:     Adam Chan is a 50 y.o. male patient here today with concern over STI exposure/symptoms.   Went to [facility] about a week and a half ago. Last sexual encounter just prior to when he left. Over the last 1 week he has noticed severe rectal pain/constipation symptoms with mucus; felt blocked and started bleeding with wiping. Heaviness and pressure with extreme difficulty passing BMs. Has been trying to treat the constipation like symptoms with OTC medications and fiber/water intake. In addition to the rectal pain he has noticed fatigue, flu-like symptoms and new rash on neck, head and back that started the last few days.  Had first hmpx vaccine about 1 month ago and second dose yesterday.     Review of Systems: Review of Systems  Constitutional:  Positive for chills and fatigue. Negative for fever.  HENT:  Positive for sore throat. Negative for mouth sores.   Eyes:  Negative for visual disturbance.  Gastrointestinal:  Positive for constipation and rectal pain. Negative for abdominal distention, abdominal pain and anal bleeding. Blood in stool: mucus in stool. Genitourinary:  Negative for difficulty urinating, dysuria, genital sores, penile discharge, penile pain, scrotal swelling and testicular pain.  Musculoskeletal:  Negative for arthralgias and joint swelling.  Skin:  Positive for rash.  Neurological:  Negative for headaches.  Hematological:  Negative for adenopathy.    Sexual History:  Prefers male partners, oropharyngeal, rectal and penile sites exposed   Past Medical History:  Diagnosis Date   Depression 04/06/2018   Diabetes type 1, controlled (HCC) 04/06/2018   HIV (human immunodeficiency virus infection) (HCC) 04/06/2018   Hypertension    Nausea in adult 04/06/2018    Outpatient Medications Prior to Visit  Medication Sig Dispense Refill   bictegravir-emtricitabine-tenofovir AF (BIKTARVY) 50-200-25 MG TABS tablet Take 1 tablet by mouth daily. 30  tablet 5   canagliflozin (INVOKANA) 100 MG TABS tablet TAKE 1 TABLET(100 MG) BY MOUTH DAILY BEFORE BREAKFAST 30 tablet 5   CRESTOR 40 MG tablet TAKE 1 TABLET(40 MG) BY MOUTH DAILY 30 tablet 5   escitalopram (LEXAPRO) 20 MG tablet TAKE 1 TABLET(20 MG) BY MOUTH DAILY 30 tablet 5   hydrochlorothiazide (HYDRODIURIL) 25 MG tablet TAKE 1 TABLET(25 MG) BY MOUTH DAILY 30 tablet 5   metFORMIN (GLUCOPHAGE) 1000 MG tablet TAKE 1 TABLET BY MOUTH TWICE DAILY 60 tablet 5   omeprazole (PRILOSEC) 20 MG capsule TAKE 1 CAPSULE(20 MG) BY MOUTH DAILY 30 capsule 5   ondansetron (ZOFRAN) 8 MG tablet TAKE 1 TABLET BY MOUTH EVERY 8 HOURS AS NEEDED FOR NAUSEA 20 tablet 2   sitaGLIPtin (JANUVIA) 100 MG tablet TAKE 1 TABLET(100 MG) BY MOUTH DAILY 30 tablet 5   EPINEPHrine 0.3 mg/0.3 mL IJ SOAJ injection Inject 0.3 mg into the muscle once. (Patient not taking: Reported on 02/04/2021)     magic mouthwash (nystatin, lidocaine, diphenhydrAMINE, alum & mag hydroxide) suspension Swish and spit 10 mLs 3 (three) times daily as needed for mouth pain. (Patient not taking: Reported on 02/04/2021) 180 mL 0   Semaglutide, 1 MG/DOSE, 4 MG/3ML SOPN Inject 1 mg into the skin once a week. (Patient not taking: Reported on 02/04/2021) 3 mL 12   No facility-administered medications prior to visit.    Allergies  Allergen Reactions   Elemental Sulfur Other (See Comments)   Sulfa Antibiotics Anaphylaxis    Family History  Problem Relation Age of Onset   Diabetes Mother    Hypertension Mother  Heart disease Mother    Hyperlipidemia Mother    Diabetes Father    Hypertension Father    Hyperlipidemia Father         Objective:  Objective  Vitals:   02/04/21 1542  BP: (!) 145/88  Pulse: 83  Temp: 98.2 F (36.8 C)  SpO2: 98%   Body mass index is 24.86 kg/m.   Physical Exam  Physical Exam Vitals reviewed. Exam conducted with a chaperone present.  Constitutional:      Appearance: He is ill-appearing.  HENT:      Mouth/Throat:     Pharynx: Oropharynx is clear. Posterior oropharyngeal erythema present. No oropharyngeal exudate.  Abdominal:     General: There is no distension.  Skin:    General: Skin is warm.     Findings: Rash (red nodular-papular appearing rash to the upper back, left head and neck. No umbilication noted. Some are painful.) present.  Neurological:     Mental Status: He is alert and oriented to person, place, and time.            Assessment & Plan:    Problem List Items Addressed This Visit       Unprioritized   Secondary syphilis    Concerning for re-infection with high titer 1:512 that is 4-fold higher than last check in June.  He has systemic flu-like symptoms and rash (though rash is a little atypical for syphilis).   Bicillin 2.4 million units today x 1. Repeat titers in 3 months with routine follow up care.       Rash    Monkeypox testing collected from back lesion. If related to syphilis he has been treated today.       Relevant Orders   Monkeypox Virus DNA, Qualitative Real-Time PCR   Monkeypox Virus DNA, Qualitative Real-Time PCR   Proctitis    Symptom management and pain discussed with antiinflammatories, sitz baths/warm compresses, stool softeners/laxatives.  Differential - bacterial STI, human monkeypox infection, syphilis.  Follow cytology swabs and hmpx pcr.       Flu-like symptoms - Primary    Pre-dates his second jynneos vaccine dose. Given flu like syndrome, proctitis symptoms and rash I do worry about monkeypox infection. Isolation precautions discussed today with recommendation to remain out of work until test results return.  Lesion on back swabbed and rectum swabbed.       Relevant Orders   Cytology (oral, anal, urethral) ancillary only   Cytology (oral, anal, urethral) ancillary only   Other Visit Diagnoses     Need for immunization against influenza       Relevant Orders   Flu Vaccine QUAD 39mo+IM (Fluarix, Fluzone & Alfiuria Quad  PF) (Completed)      Total encounter time: 40 min   [name], MSN, NP-C [facility] for Infectious Disease [facility] Office: [phone] Pager: [phone]

## 2021-02-04 NOTE — Assessment & Plan Note (Signed)
Monkeypox testing collected from back lesion. If related to syphilis he has been treated today.

## 2021-02-04 NOTE — Patient Instructions (Addendum)
I would recommend you stay out of work until your test results are back. They can take up to 5 days to result - I will notify you right away when available.   We gave you treatment for syphilis today - you only need one round of shots since this is a new infection.    For the rectal symptoms:  -Ibuprofen 600 mg 2-3 times a day with food for the pain  -Warm baths/compresses  -Sitz baths may be easiest for the warm water  -Stool softeners/laxatives to soften up stools (miralax, dulcolax, colace)  Please let me know if you have any new symptoms develop    How to Take a Sitz Bath A sitz bath is a warm water bath that may be used to care for your rectum, genital area, or the area between your rectum and genitals (perineum). In a sitz bath, the water only comes up to your hips and covers your buttocks. A sitz bath may be done in a bathtub or with a portable sitz bath that fits over the toilet. Your health care provider may recommend a sitz bath to help: Relieve pain and discomfort after delivering a baby. Relieve pain and itching from hemorrhoids or anal fissures. Relieve pain after certain surgeries. Relax muscles that are sore or tight. How to take a sitz bath Take 3-4 sitz baths a day, or as many as told by your health care provider. Bathtub sitz bath To take a sitz bath in a bathtub: Partially fill a bathtub with warm water. The water should be deep enough to cover your hips and buttocks when you are sitting in the tub. Follow your health care provider's instructions if you are told to put medicine in the water. Sit in the water. Open the tub drain a little, and leave it open during your bath. Turn on the warm water again, enough to replace the water that is draining out. Keep the water running throughout your bath. This helps keep the water at the right level and temperature. Soak in the water for 15-20 minutes, or as long as told by your health care provider. When you are done, be  careful when you stand up. You may feel dizzy. After the sitz bath, pat yourself dry. Do not rub your skin to dry it.  Over-the-toilet sitz bath To take a sitz bath with an over-the-toilet basin: Follow the manufacturer's instructions. Fill the basin with warm water. Follow your health care provider's instructions if you were told to put medicine in the water. Sit on the seat. Make sure the water covers your buttocks and perineum. Soak in the water for 15-20 minutes, or as long as told by your health care provider. After the sitz bath, pat yourself dry. Do not rub your skin to dry it. Clean and dry the basin between uses. Discard the basin if it cracks, or according to the manufacturer's instructions.  Contact a health care provider if: Your pain or itching gets worse. Do not continue with sitz baths if your symptoms get worse. You have new symptoms. Do not continue with sitz baths until you talk with your health care provider. Summary A sitz bath is a warm water bath in which the water only comes up to your hips and covers your buttocks. A sitz bath may help relieve pain and discomfort after delivering a baby. It also may help with pain and itching from hemorrhoids or anal fissures, or pain after certain surgeries. It can also help to relax  muscles that are sore or tight. Take 3-4 sitz baths a day, or as many as told by your health care provider. Soak in the water for 15-20 minutes. Do not continue with sitz baths if your symptoms get worse. This information is not intended to replace advice given to you by your health care provider. Make sure you discuss any questions you have with your health care provider. Document Revised: 01/10/2020 Document Reviewed: 01/10/2020 Elsevier Patient Education  2022 [employer].

## 2021-02-04 NOTE — Assessment & Plan Note (Signed)
Pre-dates his second jynneos vaccine dose. Given flu like syndrome, proctitis symptoms and rash I do worry about monkeypox infection. Isolation precautions discussed today with recommendation to remain out of work until test results return.  Lesion on back swabbed and rectum swabbed.

## 2021-02-04 NOTE — Assessment & Plan Note (Addendum)
Concerning for re-infection with high titer 1:512 that is 4-fold higher than last check in June.  He has systemic flu-like symptoms and rash (though rash is a little atypical for syphilis).   Bicillin 2.4 million units today x 1. Repeat titers in 3 months with routine follow up care.

## 2021-02-04 NOTE — Assessment & Plan Note (Signed)
Symptom management and pain discussed with antiinflammatories, sitz baths/warm compresses, stool softeners/laxatives.  Differential - bacterial STI, human monkeypox infection, syphilis.  Follow cytology swabs and hmpx pcr.

## 2021-02-06 LAB — CYTOLOGY, (ORAL, ANAL, URETHRAL) ANCILLARY ONLY
Chlamydia: NEGATIVE
Chlamydia: POSITIVE — AB
Comment: NEGATIVE
Comment: NEGATIVE
Comment: NORMAL
Comment: NORMAL
Neisseria Gonorrhea: NEGATIVE
Neisseria Gonorrhea: NEGATIVE

## 2021-02-06 MED ORDER — DOXYCYCLINE HYCLATE 100 MG PO TABS: 100.0000 mg | ORAL_TABLET | Freq: Two times a day (BID) | ORAL | 0 refills | Status: AC

## 2021-02-06 NOTE — Addendum Note (Signed)
Addended by: Blanchard Kelch on: 02/06/2021 02:28 PM   Modules accepted: Orders

## 2021-02-07 LAB — MONKEYPOX VIRUS DNA, QUALITATIVE REAL-TIME PCR
MONKEYPOX VIRUS DNA, QL PCR: INVALID
MONKEYPOX VIRUS DNA, QL PCR: NOT DETECTED
Orthopoxvirus DNA, QL PCR: INVALID
Orthopoxvirus DNA, QL PCR: NOT DETECTED

## 2021-02-13 ENCOUNTER — Other Ambulatory Visit: Payer: Self-pay | Admitting: Infectious Diseases

## 2021-02-18 ENCOUNTER — Other Ambulatory Visit: Payer: Self-pay

## 2021-02-18 ENCOUNTER — Ambulatory Visit (INDEPENDENT_AMBULATORY_CARE_PROVIDER_SITE_OTHER): Payer: Self-pay | Admitting: Family

## 2021-02-18 ENCOUNTER — Encounter: Payer: Self-pay | Admitting: Family

## 2021-02-18 VITALS — HR 75 | Temp 97.8°F | Wt 158.0 lb

## 2021-02-18 DIAGNOSIS — B20 Human immunodeficiency virus [HIV] disease: Secondary | ICD-10-CM

## 2021-02-18 DIAGNOSIS — A5149 Other secondary syphilitic conditions: Secondary | ICD-10-CM

## 2021-02-18 DIAGNOSIS — E1165 Type 2 diabetes mellitus with hyperglycemia: Secondary | ICD-10-CM

## 2021-02-18 DIAGNOSIS — F3342 Major depressive disorder, recurrent, in full remission: Secondary | ICD-10-CM

## 2021-02-18 DIAGNOSIS — K219 Gastro-esophageal reflux disease without esophagitis: Secondary | ICD-10-CM

## 2021-02-18 MED ORDER — SITAGLIPTIN PHOSPHATE 100 MG PO TABS: ORAL_TABLET | ORAL | 5 refills | Status: DC

## 2021-02-18 MED ORDER — CANAGLIFLOZIN 100 MG PO TABS: ORAL_TABLET | ORAL | 5 refills | Status: DC

## 2021-02-18 MED ORDER — BIKTARVY 50-200-25 MG PO TABS: 1.0000 | ORAL_TABLET | Freq: Every day | ORAL | 5 refills | Status: DC

## 2021-02-18 MED ORDER — OMEPRAZOLE 20 MG PO CPDR: DELAYED_RELEASE_CAPSULE | ORAL | 5 refills | Status: DC

## 2021-02-18 MED ORDER — ESCITALOPRAM OXALATE 20 MG PO TABS: ORAL_TABLET | ORAL | 5 refills | Status: DC

## 2021-02-18 MED ORDER — HYDROCHLOROTHIAZIDE 25 MG PO TABS: ORAL_TABLET | ORAL | 5 refills | Status: DC

## 2021-02-18 NOTE — Progress Notes (Signed)
Brief Narrative   Patient ID: Adam Chan, male    DOB: 07/31/1970, 50 y.o.   MRN: [id number]  [name] is a 50 y/o Hispanic male diagnosed with HIV disease around September 2018 with risk factor of bisexual contact. Initial CD4 count and viral load are unavailable. Genotype on 10/19/16 with K103N. No history of opportunistic infection. [id number] negative. ART regimen initially Genvoya which caused nausea and changed to [employer].   Subjective:    Chief Complaint  Patient presents with   Follow-up    HPI:  Adam Chan is a 50 y.o. male with HIV disease and Type 2 diabetes last seen on 02/04/21 by [name], NP for acute office visit and diagnosed with proctitis and secondary syphilis. Was treated with 2.4 million units of Bicillin once. Last HIV visit on 10/21/20 with well-controlled virus and good adherence and tolerance to his ART regimen of Biktarvy.  Viral load was undetectable with CD4 count of 1414.  Most recent lab work completed on 02/02/2021 with viral load that remains undetectable and CD4 count of 667.  RPR titer increased from 1: 128 to 1: 512.  Previously treated on 9/28 as noted.  Hemoglobin A1c improved to 7.9.  Here today for routine follow-up.  [name] has been taking his medications daily as prescribed with no adverse side effects.  He stopped taking metformin as noticed improvements in his nausea as well as constipation.  Feeling well today as he has improved his nutritional intake and decrease the amount of sugar. Denies fevers, chills, night sweats, headaches, changes in vision, neck pain/stiffness, nausea, diarrhea, vomiting, lesions or rashes.  [name] has no problems obtaining medication from the pharmacy and financial assistance is up-to-date.  Denies feelings of being down, depressed, or hopeless recently.  Continues to use marijuana on occasion as well as a social alcohol drinker and remains a former tobacco smoker.  Condoms offered.  All  immunizations are up-to-date per recommendations.   Allergies  Allergen Reactions   Elemental Sulfur Other (See Comments)   Sulfa Antibiotics Anaphylaxis      Outpatient Medications Prior to Visit  Medication Sig Dispense Refill   bictegravir-emtricitabine-tenofovir AF (BIKTARVY) 50-200-25 MG TABS tablet Take 1 tablet by mouth daily. 30 tablet 5   canagliflozin (INVOKANA) 100 MG TABS tablet TAKE 1 TABLET(100 MG) BY MOUTH DAILY BEFORE BREAKFAST 30 tablet 5   doxycycline (VIBRA-TABS) 100 MG tablet Take 1 tablet (100 mg total) by mouth 2 (two) times daily for 21 days. 42 tablet 0   escitalopram (LEXAPRO) 20 MG tablet TAKE 1 TABLET(20 MG) BY MOUTH DAILY 30 tablet 5   hydrochlorothiazide (HYDRODIURIL) 25 MG tablet TAKE 1 TABLET(25 MG) BY MOUTH DAILY 30 tablet 5   omeprazole (PRILOSEC) 20 MG capsule TAKE 1 CAPSULE(20 MG) BY MOUTH DAILY 30 capsule 5   ondansetron (ZOFRAN) 8 MG tablet TAKE 1 TABLET BY MOUTH EVERY 8 HOURS AS NEEDED FOR NAUSEA 20 tablet 2   sitaGLIPtin (JANUVIA) 100 MG tablet TAKE 1 TABLET(100 MG) BY MOUTH DAILY 30 tablet 5   metFORMIN (GLUCOPHAGE) 1000 MG tablet TAKE 1 TABLET BY MOUTH TWICE DAILY 60 tablet 5   CRESTOR 40 MG tablet TAKE 1 TABLET(40 MG) BY MOUTH DAILY (Patient not taking: Reported on 02/18/2021) 30 tablet 5   EPINEPHrine 0.3 mg/0.3 mL IJ SOAJ injection Inject 0.3 mg into the muscle once. (Patient not taking: No sig reported)     magic mouthwash (nystatin, lidocaine, diphenhydrAMINE, alum & mag hydroxide) suspension Swish and spit  10 mLs 3 (three) times daily as needed for mouth pain. (Patient not taking: No sig reported) 180 mL 0   Semaglutide, 1 MG/DOSE, 4 MG/3ML SOPN Inject 1 mg into the skin once a week. (Patient not taking: No sig reported) 3 mL 12   No facility-administered medications prior to visit.     Past Medical History:  Diagnosis Date   Depression 04/06/2018   Diabetes type 1, controlled (HCC) 04/06/2018   HIV (human immunodeficiency virus  infection) (HCC) 04/06/2018   Hypertension    Nausea in adult 04/06/2018     Past Surgical History:  Procedure Laterality Date   Bilaterla mesh for Hernia Bilateral 2009   CHOLECYSTECTOMY        Review of Systems  Constitutional:  Negative for appetite change, chills, fatigue, fever and unexpected weight change.  Eyes:  Negative for visual disturbance.       Negative for changes in vision.  Respiratory:  Negative for cough, chest tightness, shortness of breath and wheezing.   Cardiovascular:  Negative for chest pain, palpitations and leg swelling.  Gastrointestinal:  Negative for abdominal pain, constipation, diarrhea, nausea and vomiting.  Endocrine: Negative for polydipsia, polyphagia and polyuria.  Genitourinary:  Negative for dysuria, flank pain, frequency, genital sores, hematuria and urgency.  Skin:  Negative for rash.  Allergic/Immunologic: Negative for immunocompromised state.  Neurological:  Negative for dizziness, weakness, light-headedness and headaches.     Objective:    Pulse 75   Temp 97.8 F (36.6 C) (Oral)   Wt 158 lb (71.7 kg)   SpO2 99%   BMI 25.50 kg/m  Nursing note and vital signs reviewed.  Physical Exam Constitutional:      General: He is not in acute distress.    Appearance: He is well-developed.  Eyes:     Conjunctiva/sclera: Conjunctivae normal.  Cardiovascular:     Rate and Rhythm: Normal rate and regular rhythm.     Heart sounds: Normal heart sounds. No murmur heard.   No friction rub. No gallop.  Pulmonary:     Effort: Pulmonary effort is normal. No respiratory distress.     Breath sounds: Normal breath sounds. No wheezing or rales.  Chest:     Chest wall: No tenderness.  Abdominal:     General: Bowel sounds are normal.     Palpations: Abdomen is soft.     Tenderness: There is no abdominal tenderness.  Musculoskeletal:     Cervical back: Neck supple.  Lymphadenopathy:     Cervical: No cervical adenopathy.  Skin:    General:  Skin is warm and dry.     Findings: No rash.  Neurological:     Mental Status: He is alert and oriented to person, place, and time.  Psychiatric:        Behavior: Behavior normal.        Thought Content: Thought content normal.        Judgment: Judgment normal.     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 Haen has significant improvements in his hemoglobin A1c now down to 7.9 most credited to his dietary changes despite stopping metformin.  Congratulated on this effort.  Have discontinued metformin.  We will continue current dose of semaglutide, Invokana, and Januvia.  Plan for follow-up in 3 months or sooner if needed.        Other   Human immunodeficiency virus (HIV) disease [facility])    Mr. Jesus continues to have well-controlled virus with good adherence and tolerance to his ART regimen of Biktarvy.  No signs/symptoms of opportunistic infection.  We reviewed lab work and discussed plan of care.  Financial assistance is up-to-date.  Continue current dose of Biktarvy.  Plan for follow-up in 3 months or sooner if needed with lab work 1 to 2 weeks prior to appointment.      Secondary  syphilis    Secondary syphilis treated with 2,400,000 units of Bicillin IM once on 9/28.  Rash has since resolved.  Recheck RPR at next office visit.        I have discontinued Daymion Delaurentis's metFORMIN. I am also having him maintain his EPINEPHrine, Crestor, omeprazole, hydrochlorothiazide, (magic mouthwash (nystatin, lidocaine, diphenhydrAMINE, alum & mag hydroxide) suspension), Semaglutide (1 MG/DOSE), Biktarvy, canagliflozin, sitaGLIPtin, escitalopram, ondansetron, and doxycycline.   No orders of the defined types were placed in this encounter.    Follow-up: Return in about 3 months (around 05/21/2021), or if symptoms worsen or fail to improve.   Marcos Eke, MSN, FNP-C Nurse Practitioner [facility] for Infectious Disease [facility] Main number: [phone]

## 2021-02-18 NOTE — Assessment & Plan Note (Signed)
Mr. Meegan has significant improvements in his hemoglobin A1c now down to 7.9 most credited to his dietary changes despite stopping metformin.  Congratulated on this effort.  Have discontinued metformin.  We will continue current dose of semaglutide, Invokana, and Januvia.  Plan for follow-up in 3 months or sooner if needed.

## 2021-02-18 NOTE — Assessment & Plan Note (Signed)
Secondary syphilis treated with 2,400,000 units of Bicillin IM once on 9/28.  Rash has since resolved.  Recheck RPR at next office visit.

## 2021-02-18 NOTE — Assessment & Plan Note (Signed)
Adam Chan continues to have well-controlled virus with good adherence and tolerance to his ART regimen of Biktarvy.  No signs/symptoms of opportunistic infection.  We reviewed lab work and discussed plan of care.  Financial assistance is up-to-date.  Continue current dose of Biktarvy.  Plan for follow-up in 3 months or sooner if needed with lab work 1 to 2 weeks prior to appointment.

## 2021-02-18 NOTE — Patient Instructions (Addendum)
Nice to see you.  We will continue your medications without changes.   Refills will sent to the pharmacy.   Plan for follow up in 3 months or sooner if needed with lab work 1-2 weeks prior to appointment.   Have a great day and stay safe!

## 2021-03-16 ENCOUNTER — Encounter: Payer: Self-pay | Admitting: Family

## 2021-04-28 ENCOUNTER — Telehealth: Payer: Self-pay

## 2021-04-28 NOTE — Telephone Encounter (Signed)
[facility] Patient Advocate Encounter  Patient's medication (3 month supply of Ozempic) have been couriered to [facility] from [employer]  and will be picked up 12/21/2.  Clearance Coots , CPhT Specialty Pharmacy Patient [facility] for Infectious Disease Phone: [phone] Fax:  [phone]

## 2021-05-21 ENCOUNTER — Other Ambulatory Visit: Payer: Self-pay

## 2021-05-21 ENCOUNTER — Ambulatory Visit: Payer: Self-pay

## 2021-05-21 DIAGNOSIS — E1165 Type 2 diabetes mellitus with hyperglycemia: Secondary | ICD-10-CM

## 2021-05-21 DIAGNOSIS — A5149 Other secondary syphilitic conditions: Secondary | ICD-10-CM

## 2021-05-21 DIAGNOSIS — B20 Human immunodeficiency virus [HIV] disease: Secondary | ICD-10-CM

## 2021-05-22 ENCOUNTER — Other Ambulatory Visit: Payer: Self-pay | Admitting: Family

## 2021-05-22 DIAGNOSIS — B20 Human immunodeficiency virus [HIV] disease: Secondary | ICD-10-CM

## 2021-05-22 LAB — MICROALBUMIN / CREATININE URINE RATIO
Creatinine, Urine: 23 mg/dL (ref 20–320)
Microalb Creat Ratio: 9 mcg/mg creat (ref <30)
Microalb, Ur: 0.2 mg/dL

## 2021-05-22 LAB — T-HELPER CELL (CD4) - (RCID CLINIC ONLY)
CD4 % Helper T Cell: 42 % (ref 33–65)
CD4 T Cell Abs: 916 /uL (ref 400–1790)

## 2021-05-23 LAB — FLUORESCENT TREPONEMAL AB(FTA)-IGG-BLD: Fluorescent Treponemal ABS: REACTIVE — AB

## 2021-05-23 LAB — HEMOGLOBIN A1C
Hgb A1c MFr Bld: 12 % of total Hgb — ABNORMAL HIGH (ref <5.7)
Mean Plasma Glucose: 298 mg/dL
eAG (mmol/L): 16.5 mmol/L

## 2021-05-23 LAB — HIV-1 RNA QUANT-NO REFLEX-BLD
HIV 1 RNA Quant: 36 Copies/mL — ABNORMAL HIGH
HIV-1 RNA Quant, Log: 1.55 Log cps/mL — ABNORMAL HIGH

## 2021-05-23 LAB — RPR: RPR Ser Ql: REACTIVE — AB

## 2021-05-23 LAB — RPR TITER: RPR Titer: 1:32 {titer} — ABNORMAL HIGH

## 2021-06-04 ENCOUNTER — Encounter: Payer: Self-pay | Admitting: Family

## 2021-06-11 ENCOUNTER — Encounter: Payer: Self-pay | Admitting: Family

## 2021-06-12 ENCOUNTER — Encounter: Payer: Self-pay | Admitting: Family

## 2021-06-12 ENCOUNTER — Ambulatory Visit (INDEPENDENT_AMBULATORY_CARE_PROVIDER_SITE_OTHER): Payer: Self-pay | Admitting: Family

## 2021-06-12 ENCOUNTER — Other Ambulatory Visit: Payer: Self-pay

## 2021-06-12 VITALS — BP 154/97 | HR 89 | Temp 97.6°F | Wt 153.0 lb

## 2021-06-12 DIAGNOSIS — K219 Gastro-esophageal reflux disease without esophagitis: Secondary | ICD-10-CM

## 2021-06-12 DIAGNOSIS — B20 Human immunodeficiency virus [HIV] disease: Secondary | ICD-10-CM

## 2021-06-12 DIAGNOSIS — F3342 Major depressive disorder, recurrent, in full remission: Secondary | ICD-10-CM

## 2021-06-12 DIAGNOSIS — E1165 Type 2 diabetes mellitus with hyperglycemia: Secondary | ICD-10-CM

## 2021-06-12 DIAGNOSIS — A5149 Other secondary syphilitic conditions: Secondary | ICD-10-CM

## 2021-06-12 DIAGNOSIS — Z0000 Encounter for general adult medical examination without abnormal findings: Secondary | ICD-10-CM

## 2021-06-12 MED ORDER — HYDROCHLOROTHIAZIDE 25 MG PO TABS: ORAL_TABLET | ORAL | 5 refills | Status: DC

## 2021-06-12 MED ORDER — BIKTARVY 50-200-25 MG PO TABS: 1.0000 | ORAL_TABLET | Freq: Every day | ORAL | 5 refills | Status: DC

## 2021-06-12 MED ORDER — CANAGLIFLOZIN 100 MG PO TABS: ORAL_TABLET | ORAL | 5 refills | Status: DC

## 2021-06-12 MED ORDER — ESCITALOPRAM OXALATE 20 MG PO TABS: ORAL_TABLET | ORAL | 5 refills | Status: DC

## 2021-06-12 MED ORDER — ONDANSETRON HCL 8 MG PO TABS: 8.0000 mg | ORAL_TABLET | Freq: Three times a day (TID) | ORAL | 5 refills | Status: DC | PRN

## 2021-06-12 MED ORDER — OMEPRAZOLE 20 MG PO CPDR: DELAYED_RELEASE_CAPSULE | ORAL | 5 refills | Status: DC

## 2021-06-12 MED ORDER — SITAGLIPTIN PHOSPHATE 100 MG PO TABS: ORAL_TABLET | ORAL | 5 refills | Status: DC

## 2021-06-12 NOTE — Assessment & Plan Note (Addendum)
Adam Chan had poorly controlled type 2 diabetes secondary to increased levels of sugar intake over the holiday season with A1c now up to 12.  He has since changed his nutritional intake and improve the quality and quantity.  We will continue current dose of Invokana, Ozempic, and Januvia.  Recheck hemoglobin A1c in 3 months.

## 2021-06-12 NOTE — Patient Instructions (Addendum)
Nice to see you.  Continue to take your medication daily as prescribed.  Refills have been sent to the pharmacy.  Plan for follow up in 3 months or sooner if needed with lab work 1-2 weeks prior to appointment.   Have a great day and stay safe!  

## 2021-06-12 NOTE — Assessment & Plan Note (Signed)
RPR titer improved from 1: 518 down to 1: 32 indicating successful treatment.  Continue to monitor RPR.

## 2021-06-12 NOTE — Assessment & Plan Note (Signed)
Adam Chan continues to have well-controlled blood pressure with good adherence and tolerance to his ART regimen of Biktarvy.  No signs/symptoms of opportunistic infection.  We reviewed lab work and discussed plan of care.  Continue current dose of Biktarvy.  Plan for follow-up in 3 months or sooner if needed with lab work 1 to 2 weeks prior to appointment.

## 2021-06-12 NOTE — Assessment & Plan Note (Signed)
·   Discussed importance of safe sexual practices and condom use.  Condoms offered. °· Vaccinations up-to-date per recommendations. °

## 2021-06-12 NOTE — Progress Notes (Signed)
Brief Narrative   Patient ID: Adam Chan, male    DOB: 10-20-1970, 51 y.o.   MRN: [id number]  [name] is a 51 y/o Hispanic male diagnosed with HIV disease around September 2018 with risk factor of bisexual contact. Initial CD4 count and viral load are unavailable. Genotype on 10/19/16 with K103N. No history of opportunistic infection. [id number] negative. ART regimen initially Genvoya which caused nausea and changed to [employer].   Subjective:    Chief Complaint  Patient presents with   Follow-up    HPI:  Adam Chan is a 51 y.o. male with HIV disease and diabetes last seen on 02/18/2021 with well-controlled virus and good adherence and tolerance to his ART regimen of Biktarvy.  Viral load at the time was 52 with CD4 count of 667.  Noted to have a positive RPR titer of 1: 512 and was treated with 2,400,000 units of Bicillin once.  Hemoglobin A1c was 7.9.  Most recent blood work completed on 05/21/2021 with viral load that remains undetectable and CD4 count of 916.  Hemoglobin A1c of 12.0.  Here today for routine follow-up.  Adam Chan continues to take his medications daily as prescribed with no adverse side effects.  Overall feeling well today with no new concerns/complaints.  Increased his sugar intake over the holiday season has since improved the quality of his nutrition. Denies fevers, chills, night sweats, headaches, changes in vision, neck pain/stiffness, nausea, diarrhea, vomiting, lesions or rashes.  Adam Chan has no problems obtaining medication from pharmacy remains covered by [employer] which is up-to-date.  Denies feelings of being down, depressed, or hopeless recently.  Continues to take Lexapro.  Routine vaccinations up-to-date recommendations.  Condoms offered.  Allergies  Allergen Reactions   Elemental Sulfur Other (See Comments)   Sulfa Antibiotics Anaphylaxis      Outpatient Medications Prior to Visit  Medication Sig Dispense Refill   Semaglutide, 1  MG/DOSE, 4 MG/3ML SOPN Inject 1 mg into the skin once a week. 3 mL 12   bictegravir-emtricitabine-tenofovir AF (BIKTARVY) 50-200-25 MG TABS tablet Take 1 tablet by mouth daily. 30 tablet 5   canagliflozin (INVOKANA) 100 MG TABS tablet TAKE 1 TABLET(100 MG) BY MOUTH DAILY BEFORE BREAKFAST 30 tablet 5   escitalopram (LEXAPRO) 20 MG tablet Take 1 tablet by mouth daily. 30 tablet 5   hydrochlorothiazide (HYDRODIURIL) 25 MG tablet TAKE 1 TABLET(25 MG) BY MOUTH DAILY 30 tablet 5   omeprazole (PRILOSEC) 20 MG capsule Take 1 tablet by mouth daily 30 capsule 5   ondansetron (ZOFRAN) 8 MG tablet TAKE 1 TABLET BY MOUTH EVERY 8 HOURS AS NEEDED FOR NAUSEA 20 tablet 2   sitaGLIPtin (JANUVIA) 100 MG tablet TAKE 1 TABLET(100 MG) BY MOUTH DAILY 30 tablet 5   EPINEPHrine 0.3 mg/0.3 mL IJ SOAJ injection Inject 0.3 mg into the muscle once. (Patient not taking: Reported on 02/04/2021)     CRESTOR 40 MG tablet TAKE 1 TABLET(40 MG) BY MOUTH DAILY (Patient not taking: Reported on 02/18/2021) 30 tablet 5   magic mouthwash (nystatin, lidocaine, diphenhydrAMINE, alum & mag hydroxide) suspension Swish and spit 10 mLs 3 (three) times daily as needed for mouth pain. (Patient not taking: Reported on 02/04/2021) 180 mL 0   No facility-administered medications prior to visit.     Past Medical History:  Diagnosis Date   Depression 04/06/2018   Diabetes type 1, controlled ([place]) 04/06/2018   HIV (human immunodeficiency virus infection) ([place]) 04/06/2018   Hypertension    Nausea in adult  04/06/2018     Past Surgical History:  Procedure Laterality Date   Bilaterla mesh for Hernia Bilateral 2009   CHOLECYSTECTOMY        Review of Systems  Constitutional:  Negative for appetite change, chills, fatigue, fever and unexpected weight change.  Eyes:  Negative for visual disturbance.       Negative for changes in vision.  Respiratory:  Negative for cough, chest tightness, shortness of breath and wheezing.   Cardiovascular:   Negative for chest pain, palpitations and leg swelling.  Gastrointestinal:  Negative for abdominal pain, constipation, diarrhea, nausea and vomiting.  Endocrine: Negative for polydipsia, polyphagia and polyuria.  Genitourinary:  Negative for dysuria, flank pain, frequency, genital sores, hematuria and urgency.  Skin:  Negative for rash.  Allergic/Immunologic: Negative for immunocompromised state.  Neurological:  Negative for dizziness, weakness, light-headedness and headaches.     Objective:    BP (!) 154/97    Pulse 89    Temp 97.6 F (36.4 C) (Oral)    Wt 153 lb (69.4 kg)    BMI 24.69 kg/m  Nursing note and vital signs reviewed.  Physical Exam Constitutional:      General: He is not in acute distress.    Appearance: He is well-developed.  Eyes:     Conjunctiva/sclera: Conjunctivae normal.  Cardiovascular:     Rate and Rhythm: Normal rate and regular rhythm.     Heart sounds: Normal heart sounds. No murmur heard.   No friction rub. No gallop.  Pulmonary:     Effort: Pulmonary effort is normal. No respiratory distress.     Breath sounds: Normal breath sounds. No wheezing or rales.  Chest:     Chest wall: No tenderness.  Abdominal:     General: Bowel sounds are normal.     Palpations: Abdomen is soft.     Tenderness: There is no abdominal tenderness.  Musculoskeletal:     Cervical back: Neck supple.  Lymphadenopathy:     Cervical: No cervical adenopathy.  Skin:    General: Skin is warm and dry.     Findings: No rash.  Neurological:     Mental Status: He is alert and oriented to person, place, and time.  Psychiatric:        Behavior: Behavior normal.        Thought Content: Thought content normal.        Judgment: Judgment normal.     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   Early syphilis, latent 06/13/2019   Type 2 diabetes mellitus with hyperglycemia, without long-term current use of insulin ([place]) 06/02/2018   Recurrent major depressive disorder, in full remission ([place]) 06/02/2018   Essential hypertension 06/02/2018   Healthcare maintenance 06/02/2018   Human immunodeficiency virus (HIV) disease ([place]) 08/02/2017     Problem List Items Addressed This Visit       Endocrine   Type 2 diabetes mellitus with hyperglycemia, without long-term current use of insulin [facility])    Adam Chan had poorly controlled type 2 diabetes secondary to increased levels of sugar intake over the holiday season with A1c now up  to 12.  He has since changed his nutritional intake and improve the quality and quantity.  We will continue current dose of Invokana, Ozempic, and Januvia.  Recheck hemoglobin A1c in 3 months.      Relevant Medications   sitaGLIPtin (JANUVIA) 100 MG tablet   canagliflozin (INVOKANA) 100 MG TABS tablet   Other Relevant Orders   HgB A1c     Other   Human immunodeficiency virus (HIV) disease ([place])    Adam Chan continues to have well-controlled blood pressure with good adherence and tolerance to his ART regimen of Biktarvy.  No signs/symptoms of opportunistic infection.  We reviewed lab work and discussed plan of care.  Continue current dose of Biktarvy.  Plan for follow-up in 3 months or sooner if needed with lab work 1 to 2 weeks prior to appointment.      Relevant Medications   bictegravir-emtricitabine-tenofovir AF (BIKTARVY) 50-200-25 MG TABS tablet   ondansetron (ZOFRAN) 8 MG tablet   Other Relevant Orders   HIV-1 RNA quant-no reflex-bld   T-helper cells (CD4) count (not at [facility])   COMPLETE METABOLIC PANEL WITH GFR   Recurrent major depressive disorder, in full remission ([place])   Relevant Medications   escitalopram (LEXAPRO) 20 MG  tablet   Healthcare maintenance    Discussed importance of safe sexual practices and condom use.  Condoms offered. Vaccinations up-to-date per recommendations.      Secondary syphilis - Primary    RPR titer improved from 1: 518 down to 1: 32 indicating successful treatment.  Continue to monitor RPR.      Relevant Medications   bictegravir-emtricitabine-tenofovir AF (BIKTARVY) 50-200-25 MG TABS tablet   Other Relevant Orders   RPR   Other Visit Diagnoses     Gastroesophageal reflux disease without esophagitis       Relevant Medications   omeprazole (PRILOSEC) 20 MG capsule   ondansetron (ZOFRAN) 8 MG tablet        I have discontinued Adam Chan Crestor and (magic mouthwash (nystatin, lidocaine, diphenhydrAMINE, alum & mag hydroxide) suspension). I have also changed his ondansetron. Additionally, I am having him maintain his EPINEPHrine, Semaglutide (1 MG/DOSE), Biktarvy, escitalopram, hydrochlorothiazide, omeprazole, sitaGLIPtin, and canagliflozin.   Meds ordered this encounter  Medications   bictegravir-emtricitabine-tenofovir AF (BIKTARVY) 50-200-25 MG TABS tablet    Sig: Take 1 tablet by mouth daily.    Dispense:  30 tablet    Refill:  5    Order Specific Question:   Supervising Provider    Answer:   Baxter Flattery, CYNTHIA [[id number]]   escitalopram (LEXAPRO) 20 MG tablet    Sig: Take 1 tablet by mouth daily.    Dispense:  30 tablet    Refill:  5    Order Specific Question:   Supervising Provider    Answer:   Baxter Flattery, CYNTHIA [[id number]]   hydrochlorothiazide (HYDRODIURIL) 25 MG tablet    Sig: TAKE 1 TABLET(25 MG) BY MOUTH DAILY    Dispense:  30 tablet    Refill:  5    Order Specific Question:   Supervising Provider    Answer:   Baxter Flattery, CYNTHIA [[id number]]   omeprazole (PRILOSEC) 20 MG capsule    Sig: Take 1 tablet by mouth daily    Dispense:  30 capsule    Refill:  5    Order Specific Question:   Supervising Provider    Answer:   Baxter Flattery, CYNTHIA [[id number]]   ondansetron (ZOFRAN)  8 MG tablet    Sig: Take 1 tablet (8 mg  total) by mouth every 8 (eight) hours as needed. for nausea    Dispense:  20 tablet    Refill:  5    Order Specific Question:   Supervising Provider    Answer:   Baxter Flattery, CYNTHIA [[id number]]   sitaGLIPtin (JANUVIA) 100 MG tablet    Sig: TAKE 1 TABLET(100 MG) BY MOUTH DAILY    Dispense:  30 tablet    Refill:  5    Order Specific Question:   Supervising Provider    Answer:   Baxter Flattery, CYNTHIA [id number]   canagliflozin (INVOKANA) 100 MG TABS tablet    Sig: TAKE 1 TABLET(100 MG) BY MOUTH DAILY BEFORE BREAKFAST    Dispense:  30 tablet    Refill:  5    Order Specific Question:   Supervising Provider    Answer:   Carlyle Basques [id number]     Follow-up: Return in about 3 months (around 09/09/2021), or if symptoms worsen or fail to improve.   Terri Piedra, MSN, FNP-C Nurse Practitioner [facility] for Infectious Disease [place] number: [phone]

## 2021-09-09 ENCOUNTER — Encounter (HOSPITAL_COMMUNITY): Payer: Self-pay

## 2021-09-09 ENCOUNTER — Other Ambulatory Visit: Payer: Self-pay

## 2021-09-09 ENCOUNTER — Emergency Department (HOSPITAL_COMMUNITY)
Admission: EM | Admit: 2021-09-09 | Discharge: 2021-09-10 | Disposition: A | Discharge status: Home / Self Care | Payer: Self-pay | Attending: Emergency Medicine | Admitting: Emergency Medicine

## 2021-09-09 DIAGNOSIS — Y92000 Kitchen of unspecified non-institutional (private) residence as  the place of occurrence of the external cause: Secondary | ICD-10-CM | POA: Insufficient documentation

## 2021-09-09 DIAGNOSIS — A5149 Other secondary syphilitic conditions: Secondary | ICD-10-CM

## 2021-09-09 DIAGNOSIS — E119 Type 2 diabetes mellitus without complications: Secondary | ICD-10-CM | POA: Insufficient documentation

## 2021-09-09 DIAGNOSIS — S61412A Laceration without foreign body of left hand, initial encounter: Secondary | ICD-10-CM | POA: Insufficient documentation

## 2021-09-09 DIAGNOSIS — B20 Human immunodeficiency virus [HIV] disease: Secondary | ICD-10-CM

## 2021-09-09 DIAGNOSIS — W260XXA Contact with knife, initial encounter: Secondary | ICD-10-CM | POA: Insufficient documentation

## 2021-09-09 DIAGNOSIS — Z7984 Long term (current) use of oral hypoglycemic drugs: Secondary | ICD-10-CM | POA: Insufficient documentation

## 2021-09-09 DIAGNOSIS — Z23 Encounter for immunization: Secondary | ICD-10-CM | POA: Insufficient documentation

## 2021-09-09 LAB — CBG MONITORING, ED: Glucose-Capillary: 370 mg/dL — ABNORMAL HIGH (ref 70–99)

## 2021-09-09 MED ORDER — TETANUS-DIPHTH-ACELL PERTUSSIS 5-2.5-18.5 LF-MCG/0.5 IM SUSY
0.5000 mL | PREFILLED_SYRINGE | Freq: Once | INTRAMUSCULAR | Status: AC
  Administered 2021-09-09: 0.5 mL via INTRAMUSCULAR
  Filled 2021-09-09: qty 0.5

## 2021-09-09 MED ORDER — HYDROCODONE-ACETAMINOPHEN 5-325 MG PO TABS
1.0000 | ORAL_TABLET | Freq: Once | ORAL | Status: AC
  Administered 2021-09-09: 1 via ORAL
  Filled 2021-09-09: qty 1

## 2021-09-09 NOTE — ED Provider Triage Note (Signed)
Emergency Medicine Provider Triage Evaluation Note ? ?Adam Chan , a 51 y.o. male  was evaluated in triage.  Pt complains of laceration to left hand.  Patient was cutting food in the kitchen and slipped with a knife and cut his left hand.  Patient has a 3-1/2 cm a 1 cm laceration on the anterior of the left hand at the base of the thumb.  Bleeding is controlled at this time.  Of note, patient is diabetic with most recent A1c being 13+ ? ?Review of Systems  ?Positive: Laceration ?Negative: Neurologic deficit in left hand ? ?Physical Exam  ?BP (!) 124/94 (BP Location: Right Arm)   Pulse (!) 106   Temp 97.7 ?F (36.5 ?C) (Oral)   Resp 15   Ht 5\' 6"  (1.676 m)   Wt 69.4 kg   SpO2 96%   BMI 24.69 kg/m?  ?Gen:   Awake, no distress   ?Resp:  Normal effort  ?MSK:   Moves extremities without difficulty  ?Other:  Patient movement, sensation intact in distal left hand ? ?Medical Decision Making  ?Medically screening exam initiated at 6:00 PM.  Appropriate orders placed.  Adam Chan was informed that the remainder of the evaluation will be completed by another provider, this initial triage assessment does not replace that evaluation, and the importance of remaining in the ED until their evaluation is complete. ? ? ?  ?Chauncy Passy, PA-C ?09/09/21 1802 ? ?

## 2021-09-09 NOTE — ED Triage Notes (Signed)
Pt states he was cutting something in the kitchen and the knife slipped and cut his left hand. Pt has a 3.5cmx1cm lac on the anterior of left hand at the base of the thumb. Pt has dry dressing on it. Bleeding is controlled. ?

## 2021-09-10 LAB — T-HELPER CELL (CD4) - (RCID CLINIC ONLY)
CD4 % Helper T Cell: 46 % (ref 33–65)
CD4 T Cell Abs: 877 /uL (ref 400–1790)

## 2021-09-10 MED ORDER — CEPHALEXIN 500 MG PO CAPS: 500.0000 mg | ORAL_CAPSULE | Freq: Two times a day (BID) | ORAL | 0 refills | Status: DC

## 2021-09-10 MED ORDER — LIDOCAINE-EPINEPHRINE (PF) 2 %-1:200000 IJ SOLN
20.0000 mL | Freq: Once | INTRAMUSCULAR | Status: AC
  Administered 2021-09-10: 20 mL
  Filled 2021-09-10: qty 20

## 2021-09-10 MED ORDER — OXYCODONE HCL 5 MG PO TABS
5.0000 mg | ORAL_TABLET | Freq: Once | ORAL | Status: AC
  Administered 2021-09-10: 5 mg via ORAL
  Filled 2021-09-10: qty 1

## 2021-09-10 NOTE — ED Provider Notes (Signed)
?[facility] EMERGENCY DEPARTMENT ?Provider Note ? ? ?CSN: [id number] ?Arrival date & time: 09/09/21  1728 ? ?  ? ?History ? ?Chief Complaint  ?Patient presents with  ? Extremity Laceration  ? ? ?Siddhanth Denk is a 51 y.o. male. ? ?The history is provided by the patient.  ?Alben Jepsen is a 51 y.o. male who presents to the Emergency Department complaining of hand laceration.  He presents the emergency department for evaluation of left hand laceration that occurred at around 5 PM today.  He has a history of diabetes and is right-hand dominant and he was trying to open something in the kitchen when the knife slipped and it cut his left hand.  He complains of severe pain to the hand.  No additional injuries. ?  ? ?Home Medications ?Prior to Admission medications   ?Medication Sig Start Date End Date Taking? Authorizing Provider  ?cephALEXin (KEFLEX) 500 MG capsule Take 1 capsule (500 mg total) by mouth 2 (two) times daily. 09/10/21  Yes Tilden Fossa, MD  ?bictegravir-emtricitabine-tenofovir AF (BIKTARVY) 50-200-25 MG TABS tablet Take 1 tablet by mouth daily. 06/12/21   Veryl Speak, FNP  ?canagliflozin (INVOKANA) 100 MG TABS tablet TAKE 1 TABLET(100 MG) BY MOUTH DAILY BEFORE BREAKFAST 06/12/21   Veryl Speak, FNP  ?EPINEPHrine 0.3 mg/0.3 mL IJ SOAJ injection Inject 0.3 mg into the muscle once. ?Patient not taking: Reported on 02/04/2021    [provider]  ?escitalopram (LEXAPRO) 20 MG tablet Take 1 tablet by mouth daily. 06/12/21   Veryl Speak, FNP  ?hydrochlorothiazide (HYDRODIURIL) 25 MG tablet TAKE 1 TABLET(25 MG) BY MOUTH DAILY 06/12/21   Veryl Speak, FNP  ?omeprazole (PRILOSEC) 20 MG capsule Take 1 tablet by mouth daily 06/12/21   Veryl Speak, FNP  ?ondansetron (ZOFRAN) 8 MG tablet Take 1 tablet (8 mg total) by mouth every 8 (eight) hours as needed. for nausea 06/12/21   Veryl Speak, FNP  ?Semaglutide, 1 MG/DOSE, 4 MG/3ML SOPN Inject 1 mg into the skin once a  week. 09/19/20   Veryl Speak, FNP  ?sitaGLIPtin (JANUVIA) 100 MG tablet TAKE 1 TABLET(100 MG) BY MOUTH DAILY 06/12/21   Veryl Speak, FNP  ?   ? ?Allergies    ?Elemental sulfur and Sulfa antibiotics   ? ?Review of Systems   ?Review of Systems  ?All other systems reviewed and are negative. ? ?Physical Exam ?Updated Vital Signs ?BP 111/83 (BP Location: Right Arm)   Pulse 92   Temp 98.1 ?F (36.7 ?C) (Oral)   Resp 18   Ht 5\' 6"  (1.676 m)   Wt 69.4 kg   SpO2 95%   BMI 24.69 kg/m?  ?Physical Exam ?Vitals and nursing note reviewed.  ?Constitutional:   ?   Appearance: He is well-developed.  ?HENT:  ?   Head: Normocephalic and atraumatic.  ?Cardiovascular:  ?   Rate and Rhythm: Normal rate and regular rhythm.  ?Pulmonary:  ?   Effort: Pulmonary effort is normal. No respiratory distress.  ?Musculoskeletal:     ?   General: No tenderness.  ?   Comments: 2+ left radial pulse.  There is an irregular laceration to the thenar eminence of the left hand.  Range of motion is intact against resistance at the first and second digits.  Sensation to light touch is intact throughout the hand.  ?Skin: ?   General: Skin is warm and dry.  ?Neurological:  ?   Mental Status: He is alert  and oriented to person, place, and time.  ?Psychiatric:     ?   Behavior: Behavior normal.  ? ? ?ED Results / Procedures / Treatments   ?Labs ?(all labs ordered are listed, but only abnormal results are displayed) ?Labs Reviewed  ?CBG MONITORING, ED - Abnormal; Notable for the following components:  ?    Result Value  ? Glucose-Capillary 370 (*)   ? All other components within normal limits  ? ? ?EKG ?None ? ?Radiology ?No results found. ? ?Procedures ?Marland Kitchen.Laceration Repair ? ?Date/Time: 09/10/2021 1:34 AM ?Performed by: Tilden Fossa, MD ?Authorized by: Tilden Fossa, MD  ? ?Consent:  ?  Consent obtained:  Verbal ?  Consent given by:  Patient ?  Risks discussed:  Infection, pain and poor cosmetic result ?Universal protocol:  ?  Patient identity  confirmed:  Verbally with patient ?Anesthesia:  ?  Anesthesia method:  Local infiltration ?  Local anesthetic:  Lidocaine 2% WITH epi ?Laceration details:  ?  Location:  Hand ?  Hand location:  L palm ?  Length (cm):  6 ?Exploration:  ?  Hemostasis achieved with:  Direct pressure ?  Imaging outcome: foreign body not noted   ?  Wound exploration: wound explored through full range of motion   ?Treatment:  ?  Area cleansed with:  Povidone-iodine and saline ?  Amount of cleaning:  Standard ?  Irrigation solution:  Sterile saline ?  Debridement:  None ?Skin repair:  ?  Repair method:  Sutures ?  Suture size:  4-0 ?  Suture material:  Prolene ?  Suture technique:  Simple interrupted ?  Number of sutures:  6 ?Approximation:  ?  Approximation:  Close ?Repair type:  ?  Repair type:  Simple ?Post-procedure details:  ?  Dressing:  Non-adherent dressing ?  Procedure completion:  Tolerated  ? ? ?Medications Ordered in ED ?Medications  ?Tdap (BOOSTRIX) injection 0.5 mL (0.5 mLs Intramuscular Given 09/09/21 1832)  ?HYDROcodone-acetaminophen (NORCO/VICODIN) 5-325 MG per tablet 1 tablet (1 tablet Oral Given 09/09/21 1831)  ?oxyCODONE (Oxy IR/ROXICODONE) immediate release tablet 5 mg (5 mg Oral Given 09/10/21 0113)  ?lidocaine-EPINEPHrine (XYLOCAINE W/EPI) 2 %-1:200000 (PF) injection 20 mL (20 mLs Infiltration Given by Other 09/10/21 0114)  ? ? ?ED Course/ Medical Decision Making/ A&P ?  ?                        ?Medical Decision Making ?Risk ?Prescription drug management. ? ? ?Patient with history of diabetes here for evaluation of left hand laceration that occurred earlier today.  He has a 6 cm irregular laceration to the palmar surface of the left hand that crosses the thenar eminence.  There is no evidence of foreign body or tendon injury.  Wound was repaired per note.  Given his diabetes will start on prophylactic antibiotics.  Discussed wound care, outpatient follow-up and return precautions. ? ? ? ? ? ? ? ?Final Clinical Impression(s)  / ED Diagnoses ?Final diagnoses:  ?Laceration of left hand without foreign body, initial encounter  ? ? ?Rx / DC Orders ?ED Discharge Orders   ? ?      Ordered  ?  cephALEXin (KEFLEX) 500 MG capsule  2 times daily       ? 09/10/21 0134  ? ?  ?  ? ?  ? ? ?  ?Tilden Fossa, MD ?09/10/21 0136 ? ?

## 2021-09-13 LAB — COMPLETE METABOLIC PANEL WITH GFR
AG Ratio: 1.3 (calc) (ref 1.0–2.5)
ALT: 32 U/L (ref 9–46)
AST: 34 U/L (ref 10–35)
Albumin: 4.5 g/dL (ref 3.6–5.1)
Alkaline phosphatase (APISO): 100 U/L (ref 35–144)
BUN: 20 mg/dL (ref 7–25)
CO2: 30 mmol/L (ref 20–32)
Calcium: 9.6 mg/dL (ref 8.6–10.3)
Chloride: 94 mmol/L — ABNORMAL LOW (ref 98–110)
Creat: 0.93 mg/dL (ref 0.70–1.30)
Globulin: 3.4 g/dL (calc) (ref 1.9–3.7)
Glucose, Bld: 373 mg/dL — ABNORMAL HIGH (ref 65–99)
Potassium: 4 mmol/L (ref 3.5–5.3)
Sodium: 133 mmol/L — ABNORMAL LOW (ref 135–146)
Total Bilirubin: 0.9 mg/dL (ref 0.2–1.2)
Total Protein: 7.9 g/dL (ref 6.1–8.1)
eGFR: 98 mL/min/{1.73_m2} (ref >=60)

## 2021-09-13 LAB — RPR TITER: RPR Titer: 1:64 {titer} — ABNORMAL HIGH

## 2021-09-13 LAB — RPR: RPR Ser Ql: REACTIVE — AB

## 2021-09-13 LAB — HIV-1 RNA QUANT-NO REFLEX-BLD
HIV 1 RNA Quant: <20 copies/mL — AB
HIV-1 RNA Quant, Log: <1.3 Log copies/mL — AB

## 2021-09-13 LAB — FLUORESCENT TREPONEMAL AB(FTA)-IGG-BLD: Fluorescent Treponemal ABS: REACTIVE — AB

## 2021-09-14 ENCOUNTER — Telehealth: Payer: Self-pay

## 2021-09-14 NOTE — Telephone Encounter (Signed)
C[facility] from [facility] called requesting advice from [facility] on RPR 1:64 titer results from 5/3 (whether additional treatment needed). Per Tammy Sours, no treatment indicated at this time. Information conveyed to [facility], who stated understanding and had no additional questions.  ? ?Wyvonne Lenz, RN  ?

## 2021-09-14 NOTE — Telephone Encounter (Signed)
Error

## 2021-09-23 ENCOUNTER — Ambulatory Visit (INDEPENDENT_AMBULATORY_CARE_PROVIDER_SITE_OTHER): Payer: Self-pay | Admitting: Family

## 2021-09-23 ENCOUNTER — Other Ambulatory Visit: Payer: Self-pay

## 2021-09-23 ENCOUNTER — Encounter: Payer: Self-pay | Admitting: Family

## 2021-09-23 VITALS — BP 111/70 | HR 78 | Resp 16 | Ht 66.0 in | Wt 154.3 lb

## 2021-09-23 DIAGNOSIS — E1165 Type 2 diabetes mellitus with hyperglycemia: Secondary | ICD-10-CM

## 2021-09-23 DIAGNOSIS — F3342 Major depressive disorder, recurrent, in full remission: Secondary | ICD-10-CM

## 2021-09-23 DIAGNOSIS — B20 Human immunodeficiency virus [HIV] disease: Secondary | ICD-10-CM

## 2021-09-23 DIAGNOSIS — A539 Syphilis, unspecified: Secondary | ICD-10-CM

## 2021-09-23 DIAGNOSIS — Z0000 Encounter for general adult medical examination without abnormal findings: Secondary | ICD-10-CM

## 2021-09-23 NOTE — Patient Instructions (Addendum)
Nice to see you.  Continue to take your medication daily as prescribed.  Refills have been sent to the pharmacy.  Plan for follow up in 3 months or sooner if needed with lab work 1-2 weeks prior to appointment.   Have a great day and stay safe!  

## 2021-09-23 NOTE — Progress Notes (Signed)
Brief Narrative   Patient ID: Adam Chan, male    DOB: [birth year], 51 y.o.   MRN: [id number]  [name] is a 51 y/o Hispanic male diagnosed with HIV disease around September 2018 with risk factor of bisexual contact. Initial CD4 count and viral load are unavailable. Genotype on 10/19/16 with K103N. No history of opportunistic infection. [id number] negative. ART regimen initially Genvoya which caused nausea and changed to [employer].   Subjective:    Chief Complaint  Patient presents with   Follow-up    FYI recent injury to Left hand laceration had to get sutures.     HPI:  Adam Chan is a 51 y.o. male with HIV disease last seen on 06/12/2021 with well-controlled virus and good adherence and tolerance to [employer].  Viral load was undetectable with CD4 count 916.  Most recent blood work completed on 09/09/2021 with viral load that remains undetectable and CD4 count of 877.  RPR increased from 1: 32-1: 64 however remains down from treatment level of 1: 512. Kidney function, liver function, electrolytes within normal ranges.  Glucose remains elevated at 373.  Previous hemoglobin A1c of 12.  Here today for routine follow-up.  Adam Chan continues to take Biktarvy daily as prescribed with no adverse side effects.  He has been out of his Ozempic for a week and is awaiting financial assistance renewal.  He has decreased his carbohydrate intake significantly over the past week and a half and is requesting a glucose meter. Denies fevers, chills, night sweats, headaches, changes in vision, neck pain/stiffness, nausea, diarrhea, vomiting, lesions or rashes.  Adam Chan has coverage through [employer].  Denies feelings of being down, depressed, or hopeless recently.  Drinks alcohol socially with occasional marijuana and no tobacco use.  Condoms offered.   Allergies  Allergen Reactions   Elemental Sulfur Other (See Comments)   Sulfa Antibiotics Anaphylaxis      Outpatient Medications Prior  to Visit  Medication Sig Dispense Refill   bictegravir-emtricitabine-tenofovir AF (BIKTARVY) 50-200-25 MG TABS tablet Take 1 tablet by mouth daily. 30 tablet 5   canagliflozin (INVOKANA) 100 MG TABS tablet TAKE 1 TABLET(100 MG) BY MOUTH DAILY BEFORE BREAKFAST 30 tablet 5   cephALEXin (KEFLEX) 500 MG capsule Take 1 capsule (500 mg total) by mouth 2 (two) times daily. 14 capsule 0   EPINEPHrine 0.3 mg/0.3 mL IJ SOAJ injection Inject 0.3 mg into the muscle once.     escitalopram (LEXAPRO) 20 MG tablet Take 1 tablet by mouth daily. 30 tablet 5   hydrochlorothiazide (HYDRODIURIL) 25 MG tablet TAKE 1 TABLET(25 MG) BY MOUTH DAILY 30 tablet 5   omeprazole (PRILOSEC) 20 MG capsule Take 1 tablet by mouth daily 30 capsule 5   ondansetron (ZOFRAN) 8 MG tablet Take 1 tablet (8 mg total) by mouth every 8 (eight) hours as needed. for nausea 20 tablet 5   Semaglutide, 1 MG/DOSE, 4 MG/3ML SOPN Inject 1 mg into the skin once a week. 3 mL 12   sitaGLIPtin (JANUVIA) 100 MG tablet TAKE 1 TABLET(100 MG) BY MOUTH DAILY 30 tablet 5   No facility-administered medications prior to visit.     Past Medical History:  Diagnosis Date   Depression 04/06/2018   Diabetes type 1, controlled (HCC) 04/06/2018   HIV (human immunodeficiency virus infection) (HCC) 04/06/2018   Hypertension    Nausea in adult 04/06/2018     Past Surgical History:  Procedure Laterality Date   Bilaterla mesh for Hernia Bilateral 2009   CHOLECYSTECTOMY  Review of Systems  Constitutional:  Negative for appetite change, chills, fatigue, fever and unexpected weight change.  Eyes:  Negative for visual disturbance.  Respiratory:  Negative for cough, chest tightness, shortness of breath and wheezing.   Cardiovascular:  Negative for chest pain and leg swelling.  Gastrointestinal:  Negative for abdominal pain, constipation, diarrhea, nausea and vomiting.  Genitourinary:  Negative for dysuria, flank pain, frequency, genital sores,  hematuria and urgency.  Skin:  Negative for rash.  Allergic/Immunologic: Negative for immunocompromised state.  Neurological:  Negative for dizziness and headaches.     Objective:    BP 111/70   Pulse 78   Resp 16   Ht 5\' 6"  (1.676 m)   Wt 154 lb 4.8 oz (70 kg)   SpO2 100%   BMI 24.90 kg/m  Nursing note and vital signs reviewed.  Physical Exam Constitutional:      General: He is not in acute distress.    Appearance: He is well-developed.  Eyes:     Conjunctiva/sclera: Conjunctivae normal.  Cardiovascular:     Rate and Rhythm: Normal rate and regular rhythm.     Heart sounds: Normal heart sounds. No murmur heard.   No friction rub. No gallop.  Pulmonary:     Effort: Pulmonary effort is normal. No respiratory distress.     Breath sounds: Normal breath sounds. No wheezing or rales.  Chest:     Chest wall: No tenderness.  Abdominal:     General: Bowel sounds are normal.     Palpations: Abdomen is soft.     Tenderness: There is no abdominal tenderness.  Musculoskeletal:     Cervical back: Neck supple.  Lymphadenopathy:     Cervical: No cervical adenopathy.  Skin:    General: Skin is warm and dry.     Findings: No rash.  Neurological:     Mental Status: He is alert and oriented to person, place, and time.  Psychiatric:        Behavior: Behavior normal.        Thought Content: Thought content normal.        Judgment: Judgment normal.        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   Type 2 diabetes mellitus with hyperglycemia, without long-term current use of insulin  (HCC) 06/02/2018   Recurrent major depressive disorder, in full remission (HCC) 06/02/2018   Essential hypertension 06/02/2018   Healthcare maintenance 06/02/2018   Human immunodeficiency virus (HIV) disease (HCC) 08/02/2017     Problem List Items Addressed This Visit       Endocrine   Type 2 diabetes mellitus with hyperglycemia, without long-term current use of insulin [facility](HCC)    Adam Chan has labile type 2 diabetes with most recent A1c in January 2012 0.0 for which he is currently working on lifestyle management.  Discussed options for glucose monitor as this is not by his current financial assistance.  Options at this point will be purchasing a meter and or referral to primary care/internal medicine for orange card application.  Pharmacy working on [employer].  Continue current dose of Sitagliptin and Invokana.  Recheck A1c at next office visit.       Relevant Orders   Comprehensive metabolic panel   HgB A1c   Urine Microalbumin w/creat. ratio     Other   Human immunodeficiency virus (HIV) disease (HCC) - Primary    Adam Chan has well-controlled virus with good adherence and tolerance to Biktarvy.  Reviewed lab work and discussed plan of care.  Financial assistance is up-to-date.  Continue current dose of Biktarvy.  Plan for follow-up in 3 months or sooner if needed with lab work 1 to 2 weeks prior to appointment.       Relevant Orders   Comprehensive metabolic panel   HIV-1 RNA quant-no reflex-bld   T-helper cell (CD4)- ([facility] clinic only)   Recurrent major depressive disorder, in full remission [facility])    Adam Chan appears stable with no current signs of psychosis or suicidal ideations and good adherence and tolerance to the Lexapro.  Continue current dose of Lexapro.       Healthcare maintenance    Discussed importance of safe sexual practices and condom use.  Condoms offered. Routine vaccinations up-to-date. Routine dental care up-to-date.       Syphilis    Syphilis titer increased from 1: 32-1: 64, however this is only a 2 fold increase and remains down from previous treatment at 1: 512.  No treatment is indicated at this time.  Would attempt retreatment at 1: 128 or higher.       Relevant Orders   RPR     I am having Adam Chan maintain his EPINEPHrine, Semaglutide (1 MG/DOSE), Biktarvy, escitalopram, hydrochlorothiazide, omeprazole, ondansetron, sitaGLIPtin, canagliflozin, and cephALEXin.   No orders of the defined types were placed in this encounter.    Follow-up: Return in about 3 months (around 12/24/2021), or if symptoms worsen or fail to improve.   Marcos Eke, MSN, FNP-C Nurse Practitioner [facility] for Infectious Disease [facility] Main number: [phone]

## 2021-09-24 ENCOUNTER — Telehealth: Payer: Self-pay

## 2021-09-24 MED ORDER — SEMAGLUTIDE (1 MG/DOSE) 4 MG/3ML ~~LOC~~ SOPN: 1.0000 mg | PEN_INJECTOR | SUBCUTANEOUS | 12 refills | Status: DC

## 2021-09-24 NOTE — Addendum Note (Signed)
Addended by: Jeanine Luz D on: 09/24/2021 10:45 AM   Modules accepted: Orders

## 2021-09-24 NOTE — Assessment & Plan Note (Signed)
Syphilis titer increased from 1: 32-1: 64, however this is only a 2 fold increase and remains down from previous treatment at 1: 512.  No treatment is indicated at this time.  Would attempt retreatment at 1: 128 or higher.

## 2021-09-24 NOTE — Telephone Encounter (Signed)
[facility] Patient Advocate Encounter  Completed and sent [employer] Patient Program application for [facility] for this patient who is uninsured.    Patient assistance phone number for follow up is [phone].   This encounter will be updated until final determination.   Ileene Patrick, [place] [facility] Patient [facility] for Infectious Disease Phone: [phone] Fax:  [phone]

## 2021-09-24 NOTE — Assessment & Plan Note (Signed)
   Discussed importance of safe sexual practices and condom use.  Condoms offered.  Routine vaccinations up-to-date.  Routine dental care up-to-date.

## 2021-09-24 NOTE — Assessment & Plan Note (Signed)
Adam Chan has labile type 2 diabetes with most recent A1c in January 2012 0.0 for which he is currently working on lifestyle management.  Discussed options for glucose monitor as this is not by his current financial assistance.  Options at this point will be purchasing a meter and or referral to primary care/internal medicine for orange card application.  Pharmacy working on [employer].  Continue current dose of Sitagliptin and Invokana.  Recheck A1c at next office visit.

## 2021-09-24 NOTE — Assessment & Plan Note (Signed)
Mr. Zeitz appears stable with no current signs of psychosis or suicidal ideations and good adherence and tolerance to the Lexapro.  Continue current dose of Lexapro.

## 2021-09-24 NOTE — Assessment & Plan Note (Signed)
Adam Chan has well-controlled virus with good adherence and tolerance to Biktarvy.  Reviewed lab work and discussed plan of care.  Financial assistance is up-to-date.  Continue current dose of Biktarvy.  Plan for follow-up in 3 months or sooner if needed with lab work 1 to 2 weeks prior to appointment.

## 2021-10-08 NOTE — Telephone Encounter (Signed)
There have been multiple issues with this per Adam Chan. She is still working on it. Will cc her to follow up on Monday.

## 2021-10-12 NOTE — Telephone Encounter (Signed)
Thanks for your hard work Lupita Leash! I think it would be imperative going forward to have patient establish a  PCP since you have been working on this for almost 3 weeks now. [facility] would be a great option since he is uninsured and they have many resources to help with this. They can also help with lower costs and drug acquisition just like we can. Again, thank you for your hard work!

## 2021-10-16 ENCOUNTER — Telehealth: Payer: Self-pay

## 2021-10-16 NOTE — Telephone Encounter (Addendum)
[facility] Patient Advocate Encounter  Completed and sent [employer] Patient Assistance Program application for Ozempic for this patient who is uninsured.    Patient ID # [id number]  Patient is approved 10/08/21 through 10/09/22.  Medication will be shipped to the clinic within 10-14  business days.   Phone [phone]  Clearance Coots, CPhT Specialty Pharmacy Patient [facility] for Infectious Disease Phone: [phone] Fax:  [phone]

## 2021-10-26 ENCOUNTER — Telehealth: Payer: Self-pay

## 2021-10-26 NOTE — Telephone Encounter (Signed)
[facility] Patient Advocate Encounter  Patient's medications have been couriered to [facility] from [employer] and will be picked up 10/27/21.   4 month supply of Ozempic  Clearance Coots , CPhT Specialty Pharmacy Patient [facility] for Infectious Disease Phone: [phone] Fax:  [phone]

## 2021-11-04 ENCOUNTER — Other Ambulatory Visit: Payer: Self-pay | Admitting: Family

## 2021-11-04 DIAGNOSIS — B20 Human immunodeficiency virus [HIV] disease: Secondary | ICD-10-CM

## 2021-11-04 NOTE — Telephone Encounter (Signed)
Please advise if okay to refill. 

## 2021-11-07 ENCOUNTER — Other Ambulatory Visit: Payer: Self-pay | Admitting: Family

## 2021-11-07 DIAGNOSIS — B20 Human immunodeficiency virus [HIV] disease: Secondary | ICD-10-CM

## 2021-11-09 NOTE — Telephone Encounter (Signed)
Please advise if okay to refill. 

## 2021-11-10 ENCOUNTER — Other Ambulatory Visit: Payer: Self-pay | Admitting: Family

## 2021-11-10 DIAGNOSIS — B20 Human immunodeficiency virus [HIV] disease: Secondary | ICD-10-CM

## 2021-11-28 ENCOUNTER — Other Ambulatory Visit: Payer: Self-pay | Admitting: Family

## 2021-11-28 DIAGNOSIS — E1165 Type 2 diabetes mellitus with hyperglycemia: Secondary | ICD-10-CM

## 2021-11-28 DIAGNOSIS — K219 Gastro-esophageal reflux disease without esophagitis: Secondary | ICD-10-CM

## 2021-11-28 DIAGNOSIS — B20 Human immunodeficiency virus [HIV] disease: Secondary | ICD-10-CM

## 2021-11-30 NOTE — Telephone Encounter (Signed)
Please advise on refills.  

## 2021-12-15 ENCOUNTER — Other Ambulatory Visit: Payer: Self-pay

## 2021-12-15 ENCOUNTER — Ambulatory Visit: Payer: Self-pay

## 2021-12-15 DIAGNOSIS — B20 Human immunodeficiency virus [HIV] disease: Secondary | ICD-10-CM

## 2021-12-15 DIAGNOSIS — A539 Syphilis, unspecified: Secondary | ICD-10-CM

## 2021-12-15 DIAGNOSIS — E1165 Type 2 diabetes mellitus with hyperglycemia: Secondary | ICD-10-CM

## 2021-12-16 ENCOUNTER — Other Ambulatory Visit: Payer: Self-pay

## 2021-12-16 ENCOUNTER — Ambulatory Visit: Payer: Self-pay

## 2021-12-16 LAB — MICROALBUMIN / CREATININE URINE RATIO
Creatinine, Urine: 38 mg/dL (ref 20–320)
Microalb Creat Ratio: 53 mcg/mg creat — ABNORMAL HIGH (ref <30)
Microalb, Ur: 2 mg/dL

## 2021-12-16 LAB — T-HELPER CELL (CD4) - (RCID CLINIC ONLY)
CD4 % Helper T Cell: 46 % (ref 33–65)
CD4 T Cell Abs: 960 /uL (ref 400–1790)

## 2021-12-17 LAB — HEMOGLOBIN A1C
Hgb A1c MFr Bld: 10.8 % of total Hgb — ABNORMAL HIGH (ref <5.7)
Mean Plasma Glucose: 263 mg/dL
eAG (mmol/L): 14.6 mmol/L

## 2021-12-17 LAB — RPR: RPR Ser Ql: REACTIVE — AB

## 2021-12-17 LAB — COMPREHENSIVE METABOLIC PANEL
AG Ratio: 1.4 (calc) (ref 1.0–2.5)
ALT: 45 U/L (ref 9–46)
AST: 34 U/L (ref 10–35)
Albumin: 4.6 g/dL (ref 3.6–5.1)
Alkaline phosphatase (APISO): 99 U/L (ref 35–144)
BUN: 17 mg/dL (ref 7–25)
CO2: 28 mmol/L (ref 20–32)
Calcium: 9.7 mg/dL (ref 8.6–10.3)
Chloride: 94 mmol/L — ABNORMAL LOW (ref 98–110)
Creat: 1.09 mg/dL (ref 0.70–1.30)
Globulin: 3.3 g/dL (calc) (ref 1.9–3.7)
Glucose, Bld: 343 mg/dL — ABNORMAL HIGH (ref 65–99)
Potassium: 3.8 mmol/L (ref 3.5–5.3)
Sodium: 133 mmol/L — ABNORMAL LOW (ref 135–146)
Total Bilirubin: 1 mg/dL (ref 0.2–1.2)
Total Protein: 7.9 g/dL (ref 6.1–8.1)

## 2021-12-17 LAB — HIV-1 RNA QUANT-NO REFLEX-BLD
HIV 1 RNA Quant: <20 Copies/mL — ABNORMAL HIGH
HIV-1 RNA Quant, Log: <1.3 Log cps/mL — ABNORMAL HIGH

## 2021-12-17 LAB — FLUORESCENT TREPONEMAL AB(FTA)-IGG-BLD: Fluorescent Treponemal ABS: REACTIVE — AB

## 2021-12-17 LAB — RPR TITER: RPR Titer: 1:32 {titer} — ABNORMAL HIGH

## 2021-12-30 ENCOUNTER — Encounter: Payer: Self-pay | Admitting: Infectious Disease

## 2021-12-30 ENCOUNTER — Other Ambulatory Visit: Payer: Self-pay

## 2021-12-30 ENCOUNTER — Ambulatory Visit (INDEPENDENT_AMBULATORY_CARE_PROVIDER_SITE_OTHER): Payer: Self-pay | Admitting: Infectious Disease

## 2021-12-30 VITALS — BP 110/71 | HR 111 | Temp 98.6°F | Ht 67.0 in | Wt 159.0 lb

## 2021-12-30 DIAGNOSIS — B20 Human immunodeficiency virus [HIV] disease: Secondary | ICD-10-CM

## 2021-12-30 DIAGNOSIS — E785 Hyperlipidemia, unspecified: Secondary | ICD-10-CM

## 2021-12-30 DIAGNOSIS — E1165 Type 2 diabetes mellitus with hyperglycemia: Secondary | ICD-10-CM

## 2021-12-30 DIAGNOSIS — A539 Syphilis, unspecified: Secondary | ICD-10-CM

## 2021-12-30 MED ORDER — PITAVASTATIN MAGNESIUM 4 MG PO TABS
4.0000 mg | ORAL_TABLET | Freq: Every day | ORAL | 11 refills | Status: DC
Start: 2021-12-30 — End: 2022-05-13

## 2021-12-30 MED ORDER — BIKTARVY 50-200-25 MG PO TABS: 1.0000 | ORAL_TABLET | Freq: Every day | ORAL | 5 refills | Status: DC

## 2021-12-30 NOTE — Progress Notes (Signed)
Subjective:  Chief complaint: For HIV disease on medications.  Patient ID: Adam Chan, male    DOB: 08/14/1970, 51 y.o.   MRN: [id number]  HPI  Adam Chan is a 51 year old Latino man who has HIV infection along comorbid diabetes mellitus hypertension hyperlipidemia syphilis.  His HIV is under reasonable control with a viral load recently less than 20 and consecutive labs though he has had some low-grade viremia for the most part less than 100 though.  He states that he is highy adherent to his Biktarvy.  History of syphilis and had quite a high titer still has residually high titer but it went down from 1:8192 to values in the  1:128, more recently 1:64, 1:32  He has been working with Adam Chan to better control his diabetes an A1c has come down to 10.    Past Medical History:  Diagnosis Date   Depression 04/06/2018   Diabetes type 1, controlled (HCC) 04/06/2018   HIV (human immunodeficiency virus infection) (HCC) 04/06/2018   Hypertension    Nausea in adult 04/06/2018    Past Surgical History:  Procedure Laterality Date   Bilaterla mesh for Hernia Bilateral 2009   CHOLECYSTECTOMY      Family History  Problem Relation Age of Onset   Diabetes Mother    Hypertension Mother    Heart disease Mother    Hyperlipidemia Mother    Diabetes Father    Hypertension Father    Hyperlipidemia Father       Social History   Socioeconomic History   Marital status: Single    Spouse name: Not on file   Number of children: Not on file   Years of education: 11   Highest education level: Not on file  Occupational History   Occupation: Unemployed  Tobacco Use   Smoking status: Former    Packs/day: 0.50    Types: Cigarettes   Smokeless tobacco: Never  Vaping Use   Vaping Use: Never used  Substance and Sexual Activity   Alcohol use: Yes   Drug use: Not Currently    Types: Marijuana    Comment: Uses for Nausea per patient stated   Sexual activity: Not Currently    Birth  control/protection: Condom    Comment: condoms declined  Other Topics Concern   Not on file  Social History Narrative   Not on file   Social Determinants of Health   Financial Resource Strain: Low Risk  (06/02/2018)   Overall Financial Resource Strain (CARDIA)    Difficulty of Paying Living Expenses: Not hard at all  Food Insecurity: No Food Insecurity (06/02/2018)   Hunger Vital Sign    Worried About Running Out of Food in the Last Year: Never true    Ran Out of Food in the Last Year: Never true  Transportation Needs: No Transportation Needs (06/02/2018)   PRAPARE - Administrator, Civil Service (Medical): No    Lack of Transportation (Non-Medical): No  Physical Activity: Not on file  Stress: Not on file  Social Connections: Not on file    Allergies  Allergen Reactions   Elemental Sulfur Other (See Comments)   Sulfa Antibiotics Anaphylaxis     Current Outpatient Medications:    BIKTARVY 50-200-25 MG TABS tablet, TAKE 1 TABLET BY MOUTH DAILY, Disp: 30 tablet, Rfl: 5   canagliflozin (INVOKANA) 100 MG TABS tablet, TAKE 1 TABLET(100 MG) BY MOUTH DAILY BEFORE BREAKFAST, Disp: 30 tablet, Rfl: 5   cephALEXin (KEFLEX) 500 MG capsule, Take  1 capsule (500 mg total) by mouth 2 (two) times daily., Disp: 14 capsule, Rfl: 0   EPINEPHrine 0.3 mg/0.3 mL IJ SOAJ injection, Inject 0.3 mg into the muscle once., Disp: , Rfl:    escitalopram (LEXAPRO) 20 MG tablet, Take 1 tablet by mouth daily., Disp: 30 tablet, Rfl: 5   hydrochlorothiazide (HYDRODIURIL) 25 MG tablet, TAKE 1 TABLET(25 MG) BY MOUTH DAILY, Disp: 30 tablet, Rfl: 5   omeprazole (PRILOSEC) 20 MG capsule, TAKE 1 CAPSULE BY MOUTH DAILY, Disp: 30 capsule, Rfl: 5   ondansetron (ZOFRAN) 8 MG tablet, TAKE 1 TABLET(8 MG) BY MOUTH EVERY 8 HOURS AS NEEDED FOR NAUSEA, Disp: 20 tablet, Rfl: 5   Semaglutide, 1 MG/DOSE, 4 MG/3ML SOPN, Inject 1 mg into the skin once a week., Disp: 3 mL, Rfl: 12   sitaGLIPtin (JANUVIA) 100 MG tablet, TAKE  1 TABLET(100 MG) BY MOUTH DAILY, Disp: 30 tablet, Rfl: 5    Review of Systems  Constitutional:  Negative for activity change, appetite change, chills, diaphoresis, fatigue, fever and unexpected weight change.  HENT:  Negative for congestion, rhinorrhea, sinus pressure, sneezing, sore throat and trouble swallowing.   Eyes:  Negative for photophobia and visual disturbance.  Respiratory:  Negative for cough, chest tightness, shortness of breath, wheezing and stridor.   Cardiovascular:  Negative for chest pain, palpitations and leg swelling.  Gastrointestinal:  Negative for abdominal distention, abdominal pain, anal bleeding, blood in stool, constipation, diarrhea, nausea and vomiting.  Genitourinary:  Negative for difficulty urinating, dysuria, flank pain and hematuria.  Musculoskeletal:  Negative for arthralgias, back pain, gait problem, joint swelling and myalgias.  Skin:  Negative for color change, pallor, rash and wound.  Neurological:  Negative for dizziness, tremors, weakness and light-headedness.  Hematological:  Negative for adenopathy. Does not bruise/bleed easily.  Psychiatric/Behavioral:  Negative for agitation, behavioral problems, confusion, decreased concentration, dysphoric mood and sleep disturbance.        Objective:   Physical Exam Constitutional:      Appearance: He is well-developed.  HENT:     Head: Normocephalic and atraumatic.  Eyes:     Conjunctiva/sclera: Conjunctivae normal.  Cardiovascular:     Rate and Rhythm: Normal rate and regular rhythm.  Pulmonary:     Effort: Pulmonary effort is normal. No respiratory distress.     Breath sounds: No wheezing.  Abdominal:     General: There is no distension.     Palpations: Abdomen is soft.  Musculoskeletal:        General: No tenderness. Normal range of motion.     Cervical back: Normal range of motion and neck supple.  Skin:    General: Skin is warm and dry.     Coloration: Skin is not pale.     Findings: No  erythema or rash.  Neurological:     General: No focal deficit present.     Mental Status: He is alert and oriented to person, place, and time.  Psychiatric:        Mood and Affect: Mood normal.        Behavior: Behavior normal.        Thought Content: Thought content normal.        Judgment: Judgment normal.           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  He will continue on his nozempic, januvia, invokana  HTN: he will continue on his hCTZ  Hyperlipidemia: I sent in Pitavastatin to [facility] on [address]

## 2021-12-30 NOTE — Patient Instructions (Signed)
Followup with Adam Chan in 3 months

## 2022-01-28 ENCOUNTER — Telehealth: Payer: Self-pay

## 2022-01-28 NOTE — Telephone Encounter (Signed)
[facility] Patient Advocate Encounter  Patient's medications have been couriered to [facility] from [place] and will be picked up 01/29/22.  [place] - [id number] Ozempic 1mg  4 boxes =   Day supply . Lot # [id number] EXP 05/09/2024  Adam Chan , CPhT Specialty Pharmacy Patient [place] for Infectious Disease Phone: [phone] Fax:  [phone]

## 2022-02-02 ENCOUNTER — Ambulatory Visit (INDEPENDENT_AMBULATORY_CARE_PROVIDER_SITE_OTHER): Payer: Self-pay

## 2022-02-02 ENCOUNTER — Other Ambulatory Visit: Payer: Self-pay

## 2022-02-02 DIAGNOSIS — Z23 Encounter for immunization: Secondary | ICD-10-CM

## 2022-02-23 ENCOUNTER — Other Ambulatory Visit: Payer: Self-pay | Admitting: Family

## 2022-02-23 DIAGNOSIS — B20 Human immunodeficiency virus [HIV] disease: Secondary | ICD-10-CM

## 2022-02-23 DIAGNOSIS — E1165 Type 2 diabetes mellitus with hyperglycemia: Secondary | ICD-10-CM

## 2022-02-26 ENCOUNTER — Other Ambulatory Visit: Payer: Self-pay | Admitting: Family

## 2022-02-26 DIAGNOSIS — K219 Gastro-esophageal reflux disease without esophagitis: Secondary | ICD-10-CM

## 2022-03-01 ENCOUNTER — Other Ambulatory Visit: Payer: Self-pay | Admitting: Family

## 2022-03-01 DIAGNOSIS — E1165 Type 2 diabetes mellitus with hyperglycemia: Secondary | ICD-10-CM

## 2022-04-15 ENCOUNTER — Other Ambulatory Visit: Payer: Self-pay

## 2022-04-22 ENCOUNTER — Telehealth: Payer: Self-pay

## 2022-04-22 NOTE — Telephone Encounter (Addendum)
[facility] Patient Advocate Encounter  Patient's medications have been couriered to [facility] from [facility] and will be picked up 04/28/22.   Ozempic 1x39ml 4 PCS NDC [id number] Lot # [id number] EXP 02/06/2025  4 month supply.    Clearance Coots , CPhT Specialty Pharmacy Patient [facility] for Infectious Disease Phone: [phone] Fax:  [phone]

## 2022-04-28 ENCOUNTER — Other Ambulatory Visit: Payer: Self-pay

## 2022-04-28 ENCOUNTER — Encounter: Payer: Self-pay | Admitting: Infectious Disease

## 2022-04-28 DIAGNOSIS — Z79899 Other long term (current) drug therapy: Secondary | ICD-10-CM

## 2022-04-28 DIAGNOSIS — B20 Human immunodeficiency virus [HIV] disease: Secondary | ICD-10-CM

## 2022-04-28 DIAGNOSIS — Z113 Encounter for screening for infections with a predominantly sexual mode of transmission: Secondary | ICD-10-CM

## 2022-04-29 ENCOUNTER — Other Ambulatory Visit: Payer: Self-pay

## 2022-04-29 DIAGNOSIS — B20 Human immunodeficiency virus [HIV] disease: Secondary | ICD-10-CM

## 2022-04-30 LAB — URINE CYTOLOGY ANCILLARY ONLY
Chlamydia: NEGATIVE
Comment: NEGATIVE
Comment: NORMAL
Neisseria Gonorrhea: NEGATIVE

## 2022-04-30 LAB — T-HELPER CELL (CD4) - (RCID CLINIC ONLY)
CD4 % Helper T Cell: 36 % (ref 33–65)
CD4 T Cell Abs: 1105 /uL (ref 400–1790)

## 2022-05-02 LAB — CBC WITH DIFFERENTIAL/PLATELET
Absolute Monocytes: 389 cells/uL (ref 200–950)
Basophils Absolute: 33 cells/uL (ref 0–200)
Basophils Relative: 0.5 %
Eosinophils Absolute: 40 cells/uL (ref 15–500)
Eosinophils Relative: 0.6 %
HCT: 46.5 % (ref 38.5–50.0)
Hemoglobin: 16.1 g/dL (ref 13.2–17.1)
Lymphs Abs: 3392 cells/uL (ref 850–3900)
MCH: 31.5 pg (ref 27.0–33.0)
MCHC: 34.6 g/dL (ref 32.0–36.0)
MCV: 91 fL (ref 80.0–100.0)
MPV: 9.2 fL (ref 7.5–12.5)
Monocytes Relative: 5.9 %
Neutro Abs: 2746 cells/uL (ref 1500–7800)
Neutrophils Relative %: 41.6 %
Platelets: 220 10*3/uL (ref 140–400)
RBC: 5.11 10*6/uL (ref 4.20–5.80)
RDW: 13.7 % (ref 11.0–15.0)
Total Lymphocyte: 51.4 %
WBC: 6.6 10*3/uL (ref 3.8–10.8)

## 2022-05-02 LAB — COMPLETE METABOLIC PANEL WITH GFR
AG Ratio: 1.5 (calc) (ref 1.0–2.5)
ALT: 104 U/L — ABNORMAL HIGH (ref 9–46)
AST: 67 U/L — ABNORMAL HIGH (ref 10–35)
Albumin: 4.5 g/dL (ref 3.6–5.1)
Alkaline phosphatase (APISO): 102 U/L (ref 35–144)
BUN: 15 mg/dL (ref 7–25)
CO2: 25 mmol/L (ref 20–32)
Calcium: 9.5 mg/dL (ref 8.6–10.3)
Chloride: 94 mmol/L — ABNORMAL LOW (ref 98–110)
Creat: 0.94 mg/dL (ref 0.70–1.30)
Globulin: 3 g/dL (calc) (ref 1.9–3.7)
Glucose, Bld: 339 mg/dL — ABNORMAL HIGH (ref 65–99)
Potassium: 3.9 mmol/L (ref 3.5–5.3)
Sodium: 131 mmol/L — ABNORMAL LOW (ref 135–146)
Total Bilirubin: 0.7 mg/dL (ref 0.2–1.2)
Total Protein: 7.5 g/dL (ref 6.1–8.1)
eGFR: 97 mL/min/{1.73_m2} (ref >=60)

## 2022-05-02 LAB — HIV-1 RNA QUANT-NO REFLEX-BLD
HIV 1 RNA Quant: NOT DETECTED Copies/mL
HIV-1 RNA Quant, Log: NOT DETECTED Log cps/mL

## 2022-05-02 LAB — T PALLIDUM AB: T Pallidum Abs: POSITIVE — AB

## 2022-05-02 LAB — RPR: RPR Ser Ql: REACTIVE — AB

## 2022-05-02 LAB — RPR TITER: RPR Titer: 1:32 {titer} — ABNORMAL HIGH

## 2022-05-13 ENCOUNTER — Ambulatory Visit (INDEPENDENT_AMBULATORY_CARE_PROVIDER_SITE_OTHER): Payer: Self-pay | Admitting: Family

## 2022-05-13 ENCOUNTER — Encounter: Payer: Self-pay | Admitting: Family

## 2022-05-13 ENCOUNTER — Other Ambulatory Visit: Payer: Self-pay

## 2022-05-13 VITALS — BP 124/83 | HR 104 | Temp 98.1°F | Resp 16 | Ht 67.0 in | Wt 163.0 lb

## 2022-05-13 DIAGNOSIS — K219 Gastro-esophageal reflux disease without esophagitis: Secondary | ICD-10-CM

## 2022-05-13 DIAGNOSIS — A539 Syphilis, unspecified: Secondary | ICD-10-CM

## 2022-05-13 DIAGNOSIS — F3342 Major depressive disorder, recurrent, in full remission: Secondary | ICD-10-CM

## 2022-05-13 DIAGNOSIS — Z9189 Other specified personal risk factors, not elsewhere classified: Secondary | ICD-10-CM

## 2022-05-13 DIAGNOSIS — R7989 Other specified abnormal findings of blood chemistry: Secondary | ICD-10-CM

## 2022-05-13 DIAGNOSIS — I10 Essential (primary) hypertension: Secondary | ICD-10-CM

## 2022-05-13 DIAGNOSIS — E1165 Type 2 diabetes mellitus with hyperglycemia: Secondary | ICD-10-CM

## 2022-05-13 DIAGNOSIS — R1084 Generalized abdominal pain: Secondary | ICD-10-CM

## 2022-05-13 DIAGNOSIS — B20 Human immunodeficiency virus [HIV] disease: Secondary | ICD-10-CM

## 2022-05-13 MED ORDER — CANAGLIFLOZIN 100 MG PO TABS: ORAL_TABLET | ORAL | 5 refills | Status: DC

## 2022-05-13 MED ORDER — ONDANSETRON HCL 8 MG PO TABS: ORAL_TABLET | ORAL | 5 refills | Status: DC

## 2022-05-13 MED ORDER — SITAGLIPTIN PHOSPHATE 100 MG PO TABS: ORAL_TABLET | ORAL | 5 refills | Status: DC

## 2022-05-13 MED ORDER — PITAVASTATIN MAGNESIUM 4 MG PO TABS: 4.0000 mg | ORAL_TABLET | Freq: Every day | ORAL | 11 refills | Status: DC

## 2022-05-13 MED ORDER — ESCITALOPRAM OXALATE 20 MG PO TABS: ORAL_TABLET | ORAL | 5 refills | Status: DC

## 2022-05-13 MED ORDER — HYDROCHLOROTHIAZIDE 25 MG PO TABS: ORAL_TABLET | ORAL | 5 refills | Status: DC

## 2022-05-13 MED ORDER — OMEPRAZOLE 20 MG PO CPDR: DELAYED_RELEASE_CAPSULE | ORAL | 5 refills | Status: DC

## 2022-05-13 MED ORDER — BIKTARVY 50-200-25 MG PO TABS: 1.0000 | ORAL_TABLET | Freq: Every day | ORAL | 5 refills | Status: DC

## 2022-05-13 NOTE — Patient Instructions (Addendum)
Nice to see you.  We will check your lab work today.  Continue to take your medication daily as prescribed.  Refills have been sent to the pharmacy.  Plan for follow up in 3 months or sooner if needed with lab work on the same day.  Have a great day and stay safe!  

## 2022-05-13 NOTE — Progress Notes (Signed)
Brief Narrative   Patient ID: Adam Chan, male    DOB: 1971/01/13, 52 y.o.   MRN: [id number]  Adam Chan is a 52 y/o Hispanic male diagnosed with HIV disease around September 2018 with risk factor of bisexual contact. Initial CD4 count and viral load are unavailable. Genotype on 10/19/16 with K103N. No history of opportunistic infection. [id number] negative. ART regimen initially Genvoya which caused nausea and changed to [employer].    Subjective:    Chief Complaint  Patient presents with   Follow-up    B20 - patient reports stomach pains, dark stools x 2.5 weeks.     HPI:  Adam Chan is a 52 y.o. male with HIV disease and Type 2 diabetes last seen by Dr. Daiva Eves on 12/30/21 with well controlled virus and good adherence and tolerance to Biktarvy. Viral load was undetectable and CD4 count 960. A1c was 10.8. Most recent lab work completed on 04/29/22 with viral load that remains undetectable and CD4 count 1,105. Renal function, hepatic function and electrolytes within normal ranges. A1c of 12.   Here today for routine follow up.  Adam Chan has not been doing so well recently having abdominal pain and darker stools recently. Has been constipated and having bowel movements only about every 3 days and generally are hard. This waxed and waned prior to starting the Ozempic. Continues to take Biktarvy, Ozempic, Invokana, and metformin as prescribed with no problems obtaining from the pharmacy. Due for colonoscopy for colon cancer screening. Condoms and STD testing offered.   Denies fevers, chills, night sweats, headaches, changes in vision, neck pain/stiffness, nausea, diarrhea, vomiting, lesions or rashes.   Allergies  Allergen Reactions   Elemental Sulfur Other (See Comments)   Sulfa Antibiotics Anaphylaxis      Outpatient Medications Prior to Visit  Medication Sig Dispense Refill   Semaglutide, 1 MG/DOSE, 4 MG/3ML SOPN Inject 1 mg into the skin once a week. 3 mL 12    bictegravir-emtricitabine-tenofovir AF (BIKTARVY) 50-200-25 MG TABS tablet Take 1 tablet by mouth daily. 30 tablet 5   escitalopram (LEXAPRO) 20 MG tablet Take 1 tablet by mouth daily. 30 tablet 5   hydrochlorothiazide (HYDRODIURIL) 25 MG tablet TAKE 1 TABLET(25 MG) BY MOUTH DAILY 30 tablet 5   INVOKANA 100 MG TABS tablet TAKE 1 TABLET(100 MG) BY MOUTH DAILY BEFORE BREAKFAST 30 tablet 5   omeprazole (PRILOSEC) 20 MG capsule TAKE 1 CAPSULE BY MOUTH DAILY 30 capsule 5   ondansetron (ZOFRAN) 8 MG tablet TAKE 1 TABLET(8 MG) BY MOUTH EVERY 8 HOURS AS NEEDED FOR NAUSEA 20 tablet 5   Pitavastatin Magnesium 4 MG TABS Take 4 mg by mouth daily. 30 tablet 11   sitaGLIPtin (JANUVIA) 100 MG tablet TAKE 1 TABLET(100 MG) BY MOUTH DAILY 30 tablet 5   cephALEXin (KEFLEX) 500 MG capsule Take 1 capsule (500 mg total) by mouth 2 (two) times daily. (Patient not taking: Reported on 12/30/2021) 14 capsule 0   EPINEPHrine 0.3 mg/0.3 mL IJ SOAJ injection Inject 0.3 mg into the muscle once. (Patient not taking: Reported on 12/30/2021)     No facility-administered medications prior to visit.     Past Medical History:  Diagnosis Date   Depression 04/06/2018   Diabetes type 1, controlled (HCC) 04/06/2018   HIV (human immunodeficiency virus infection) (HCC) 04/06/2018   Hypertension    Nausea in adult 04/06/2018     Past Surgical History:  Procedure Laterality Date   Bilaterla mesh for Hernia Bilateral 2009   CHOLECYSTECTOMY  Review of Systems  Constitutional:  Negative for appetite change, chills, fatigue, fever and unexpected weight change.  Eyes:  Negative for visual disturbance.  Respiratory:  Negative for cough, chest tightness, shortness of breath and wheezing.   Cardiovascular:  Negative for chest pain and leg swelling.  Gastrointestinal:  Positive for abdominal pain and constipation. Negative for diarrhea, nausea and vomiting.  Genitourinary:  Negative for dysuria, flank pain, frequency, genital  sores, hematuria and urgency.  Skin:  Negative for rash.  Allergic/Immunologic: Negative for immunocompromised state.  Neurological:  Negative for dizziness and headaches.      Objective:    BP 124/83   Pulse (!) 104   Temp 98.1 F (36.7 C) (Oral)   Resp 16   Ht 5\' 7"  (1.702 m)   Wt 163 lb (73.9 kg)   SpO2 96%   BMI 25.53 kg/m  Nursing note and vital signs reviewed.  Physical Exam Constitutional:      General: He is not in acute distress.    Appearance: He is well-developed.  Cardiovascular:     Rate and Rhythm: Normal rate and regular rhythm.     Heart sounds: Normal heart sounds.  Pulmonary:     Effort: Pulmonary effort is normal.     Breath sounds: Normal breath sounds.  Abdominal:     General: Bowel sounds are normal. There is no distension.  Skin:    General: Skin is warm and dry.  Neurological:     Mental Status: He is alert and oriented to person, place, and time.  Psychiatric:        Mood and Affect: Mood normal.         05/13/2022    3:18 PM 12/30/2021    4:11 PM 09/23/2021    4:28 PM 09/23/2021    4:04 PM 06/12/2021   11:05 AM  Depression screen PHQ 2/9  Decreased Interest 0 0 0 0 0  Down, Depressed, Hopeless 0 0 0 0 0  PHQ - 2 Score 0 0 0 0 0       Assessment & Plan:    Patient Active Problem List   Diagnosis Date Noted   Abdominal pain 05/14/2022   At risk for colon cancer 05/13/2022   Flu-like symptoms 02/04/2021   Rash 02/04/2021   Proctitis 02/04/2021   Syphilis 11/18/2020   Early syphilis, latent 06/13/2019   Type 2 diabetes mellitus with hyperglycemia, without long-term current use of insulin ([place]) 06/02/2018   Recurrent major depressive disorder, in full remission ([place]) 06/02/2018   Essential hypertension 06/02/2018   Healthcare maintenance 06/02/2018   Human immunodeficiency virus (HIV) disease ([place]) 08/02/2017     Problem List Items Addressed This Visit       Cardiovascular and Mediastinum   Essential hypertension    Blood  pressure well controlled with current dose of hydrochlorothiazide. No neurological/ophthalmologic signs/symptoms.  Continue current dose of hydrochlorothiazide.      Relevant Medications   hydrochlorothiazide (HYDRODIURIL) 25 MG tablet   Pitavastatin Magnesium 4 MG TABS     Endocrine   Type 2 diabetes mellitus with hyperglycemia, without long-term current use of insulin (HCC)    Type 2 diabetes remains poorly controlled with most recent hemoglobin A1c of 12 despite multiple medications.  Is having abdominal cramping and darker stools with concern for semaglutide as a contributing factor and will give a 2-week break of semaglutide to see if this improves his abdominal symptoms.  Continue current dose of Sitagliptin and canagliflozin.  May need  to consider long-acting insulin if blood sugars remain continually elevated and possible referral to Endocrinology.      Relevant Medications   canagliflozin (INVOKANA) 100 MG TABS tablet   Pitavastatin Magnesium 4 MG TABS   sitaGLIPtin (JANUVIA) 100 MG tablet   Other Relevant Orders   HgB A1c (Completed)     Other   Human immunodeficiency virus (HIV) disease (HCC)    Adam Chan continues to have well controlled virus and good adherence and tolerance to [employer].  Reviewed lab work and discussed plan of care.  Continue current dose of Biktarvy.  Financial assistance up-to-date.  Plan for follow-up in 3 months or sooner if needed with lab work 1 to 2 weeks prior to appointment.      Relevant Medications   bictegravir-emtricitabine-tenofovir AF (BIKTARVY) 50-200-25 MG TABS tablet   ondansetron (ZOFRAN) 8 MG tablet   Recurrent major depressive disorder, in full remission (HCC)    Mood remains stable with current dose of Lexapro with no adverse side effects.  Denies suicidal ideations and no signs of psychosis.  Continue current dose of Lasix      Relevant Medications   escitalopram (LEXAPRO) 20 MG tablet   Syphilis    Syphilis titer stable at 1:32. No  current symptoms. Continue to monitor RPR.       Relevant Medications   bictegravir-emtricitabine-tenofovir AF (BIKTARVY) 50-200-25 MG TABS tablet   At risk for colon cancer - Primary    Refer to GI for colon cancer screening. May need [facility] Financial Assistance or possible follow up with Internal Medicine for [facility].       Relevant Orders   Ambulatory referral to Gastroenterology   Abdominal pain    Generalized abdominal pain and darker stools in the setting of constipation and Ozempic use. Does not appear to have any acute/emergent symptoms. Will attempt medication holiday with Ozempic. Cannot rule out underlying IBS or relation to constipation. May need imaging and additional evaluation if symptoms worsen or do not improve.       Other Visit Diagnoses     Elevated LFTs       Relevant Orders   Hepatic function panel (Completed)   Gastroesophageal reflux disease without esophagitis       Relevant Medications   omeprazole (PRILOSEC) 20 MG capsule   ondansetron (ZOFRAN) 8 MG tablet        I have discontinued Adam Chan's EPINEPHrine and cephALEXin. I have changed his Invokana to canagliflozin. I have also changed his hydrochlorothiazide. Additionally, I am having him maintain his Semaglutide (1 MG/DOSE), Biktarvy, escitalopram, omeprazole, ondansetron, Pitavastatin Magnesium, and sitaGLIPtin.   Meds ordered this encounter  Medications   bictegravir-emtricitabine-tenofovir AF (BIKTARVY) 50-200-25 MG TABS tablet    Sig: Take 1 tablet by mouth daily.    Dispense:  30 tablet    Refill:  5    Order Specific Question:   Supervising Provider    Answer:   Drue Second, CYNTHIA [[id number]]   escitalopram (LEXAPRO) 20 MG tablet    Sig: Take 1 tablet by mouth daily.    Dispense:  30 tablet    Refill:  5    Order Specific Question:   Supervising Provider    Answer:   Drue Second, CYNTHIA [[id number]]   hydrochlorothiazide (HYDRODIURIL) 25 MG tablet    Sig: Take 1 tablet by mouth daily.     Dispense:  30 tablet    Refill:  5    Order Specific Question:   Supervising Provider  Answer:   Carlyle Basques [[id number]]   canagliflozin (INVOKANA) 100 MG TABS tablet    Sig: TAKE 1 TABLET(100 MG) BY MOUTH DAILY BEFORE BREAKFAST    Dispense:  30 tablet    Refill:  5    Order Specific Question:   Supervising Provider    Answer:   Baxter Flattery, CYNTHIA [[id number]]   omeprazole (PRILOSEC) 20 MG capsule    Sig: TAKE 1 CAPSULE BY MOUTH DAILY    Dispense:  30 capsule    Refill:  5    Order Specific Question:   Supervising Provider    Answer:   Baxter Flattery, CYNTHIA [[id number]]   ondansetron (ZOFRAN) 8 MG tablet    Sig: TAKE 1 TABLET(8 MG) BY MOUTH EVERY 8 HOURS AS NEEDED FOR NAUSEA    Dispense:  20 tablet    Refill:  5    Order Specific Question:   Supervising Provider    Answer:   Baxter Flattery, CYNTHIA [[id number]]   Pitavastatin Magnesium 4 MG TABS    Sig: Take 4 mg by mouth daily.    Dispense:  30 tablet    Refill:  11    Order Specific Question:   Supervising Provider    Answer:   Baxter Flattery, CYNTHIA [[id number]]   sitaGLIPtin (JANUVIA) 100 MG tablet    Sig: TAKE 1 TABLET(100 MG) BY MOUTH DAILY    Dispense:  30 tablet    Refill:  5    Order Specific Question:   Supervising Provider    Answer:   Carlyle Basques [[id number]]     Follow-up: Return in about 3 months (around 08/12/2022), or if symptoms worsen or fail to improve.   Adam Piedra, MSN, FNP-C Nurse Practitioner [facility] for Infectious Disease [place] number: [phone]

## 2022-05-14 ENCOUNTER — Encounter: Payer: Self-pay | Admitting: Family

## 2022-05-14 DIAGNOSIS — R109 Unspecified abdominal pain: Secondary | ICD-10-CM | POA: Insufficient documentation

## 2022-05-14 LAB — HEPATIC FUNCTION PANEL
AG Ratio: 1.4 (calc) (ref 1.0–2.5)
ALT: 85 U/L — ABNORMAL HIGH (ref 9–46)
AST: 55 U/L — ABNORMAL HIGH (ref 10–35)
Albumin: 4.6 g/dL (ref 3.6–5.1)
Alkaline phosphatase (APISO): 115 U/L (ref 35–144)
Bilirubin, Direct: 0.1 mg/dL (ref 0.0–0.2)
Globulin: 3.2 g/dL (calc) (ref 1.9–3.7)
Indirect Bilirubin: 0.4 mg/dL (calc) (ref 0.2–1.2)
Total Bilirubin: 0.5 mg/dL (ref 0.2–1.2)
Total Protein: 7.8 g/dL (ref 6.1–8.1)

## 2022-05-14 LAB — HEMOGLOBIN A1C
Hgb A1c MFr Bld: 12 % of total Hgb — ABNORMAL HIGH (ref <5.7)
Mean Plasma Glucose: 298 mg/dL
eAG (mmol/L): 16.5 mmol/L

## 2022-05-14 IMAGING — DX DG TIBIA/FIBULA 2V*L*
4 series · 4 of 4 positions shown · non-contrast
Comparison: None.

CLINICAL DATA: Trauma with pain of the anterior shin. Rule out
fracture.

EXAM:
LEFT TIBIA AND FIBULA - 2 VIEW

[dg tibia/fibula left (1 of 4)]
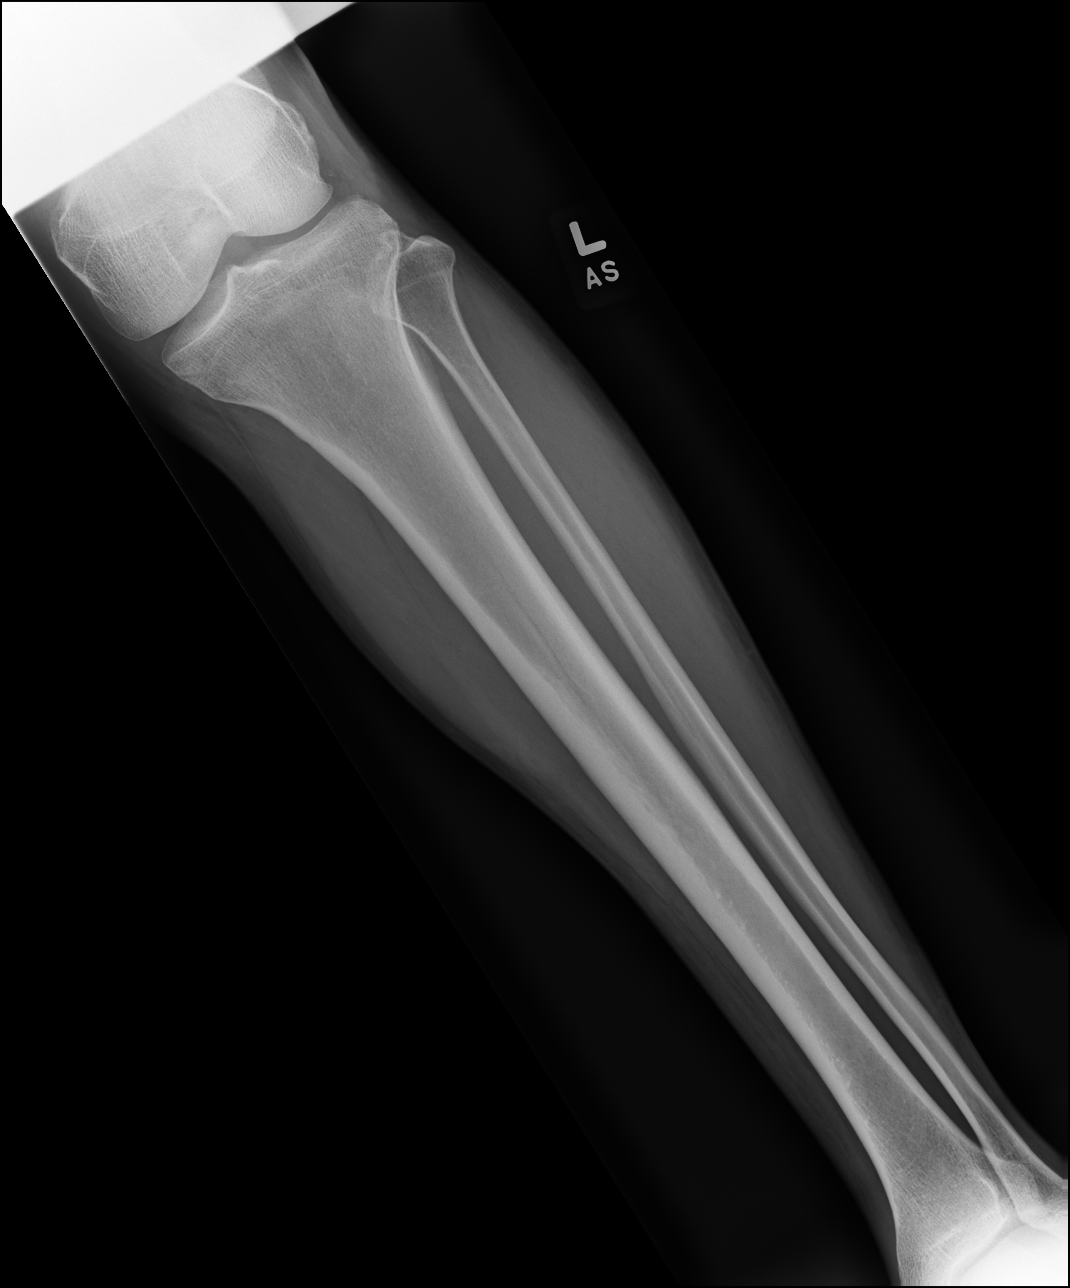

[dg tibia/fibula left (2 of 4)]
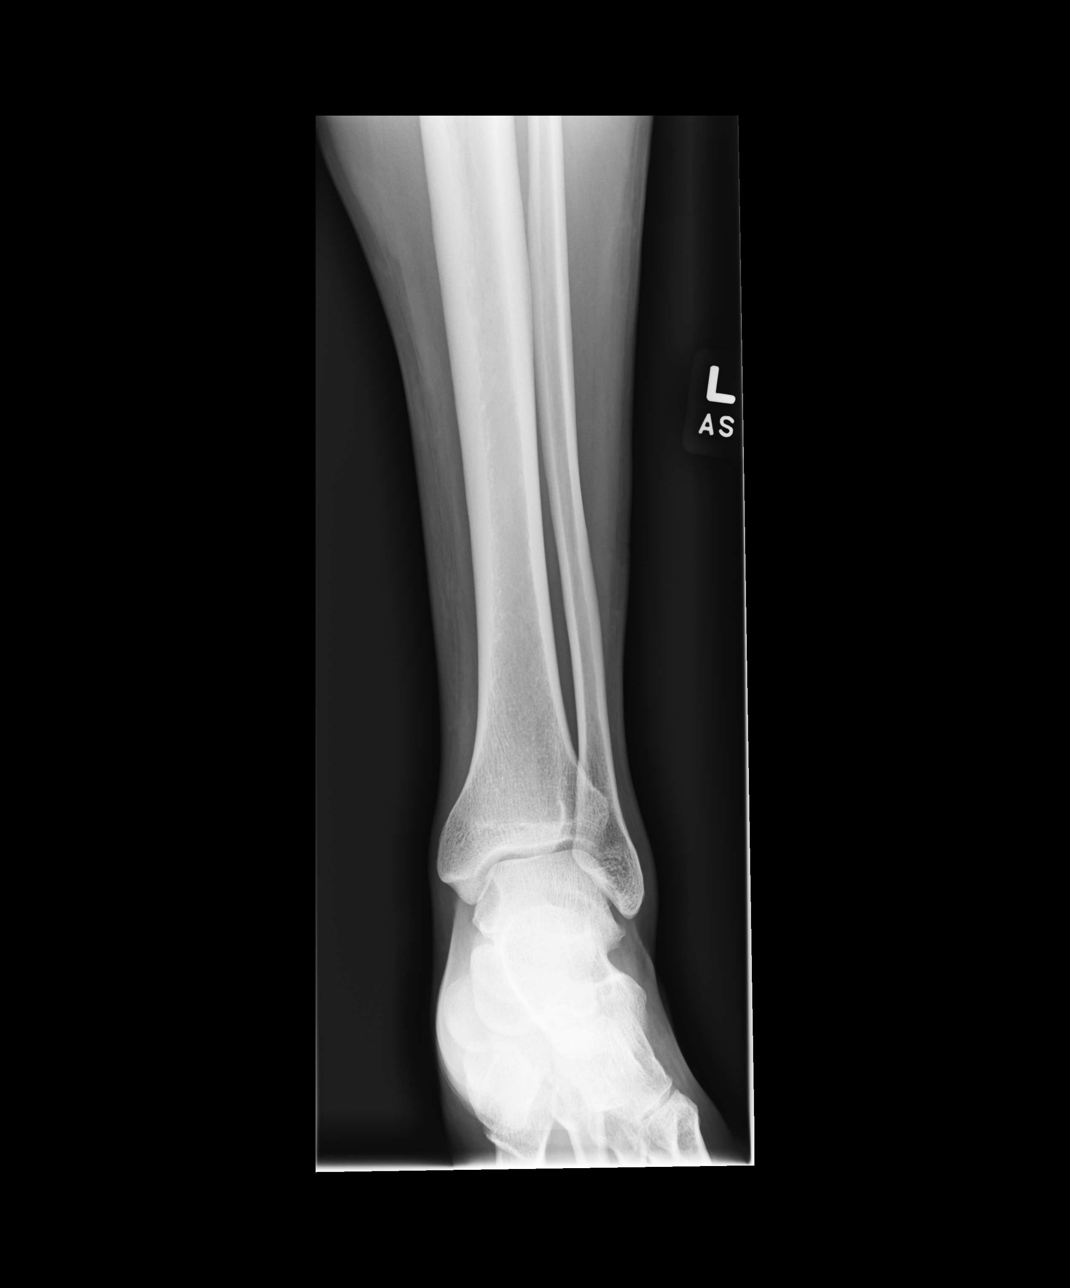

[dg tibia/fibula left (3 of 4)]
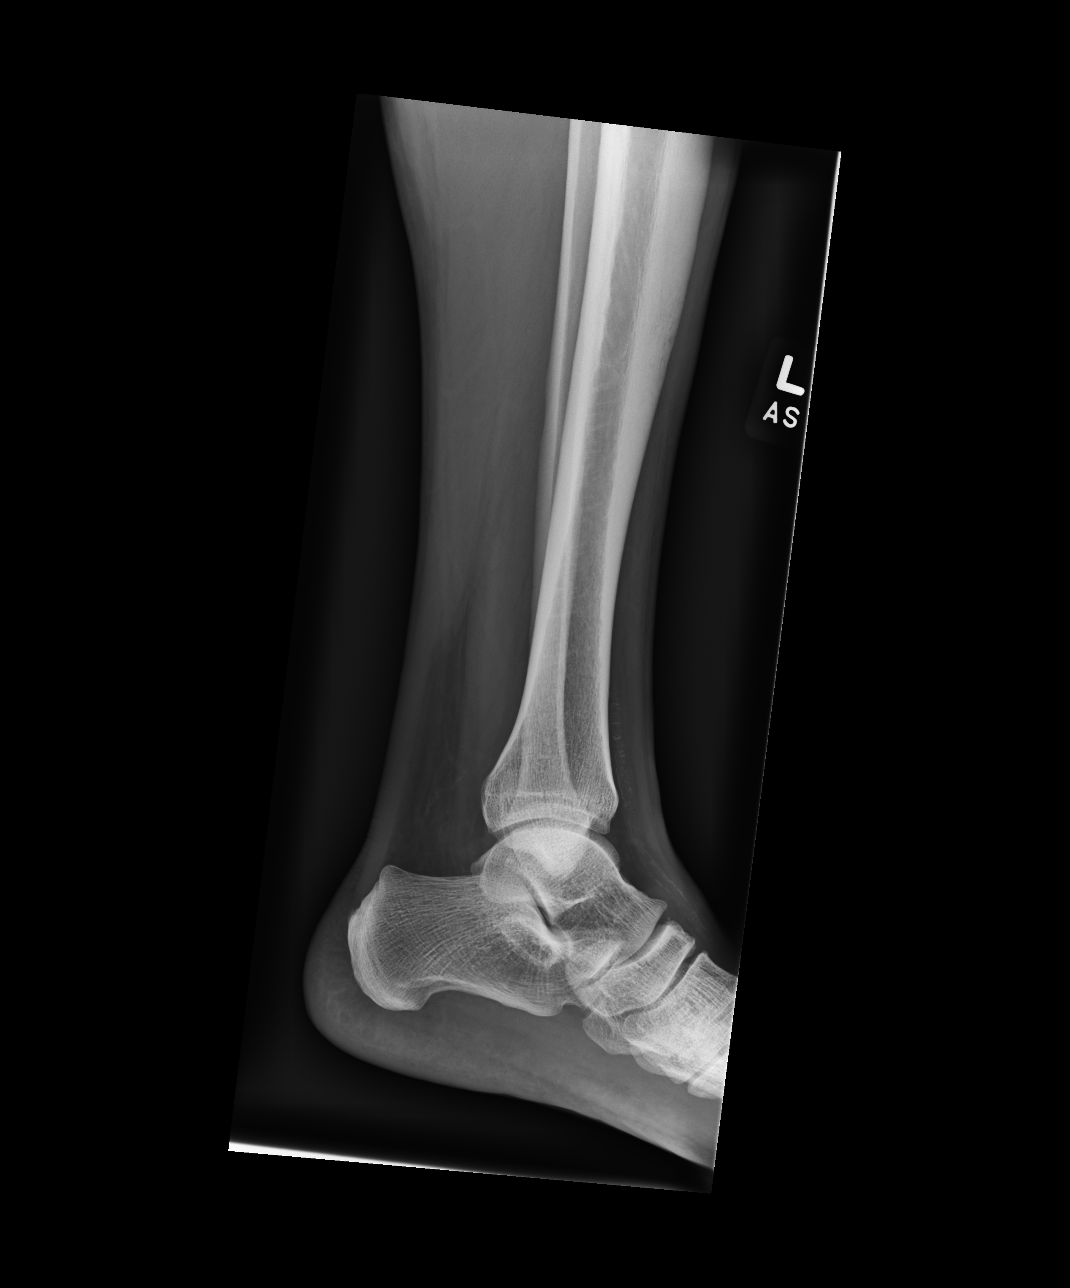

[dg tibia/fibula left (4 of 4)]
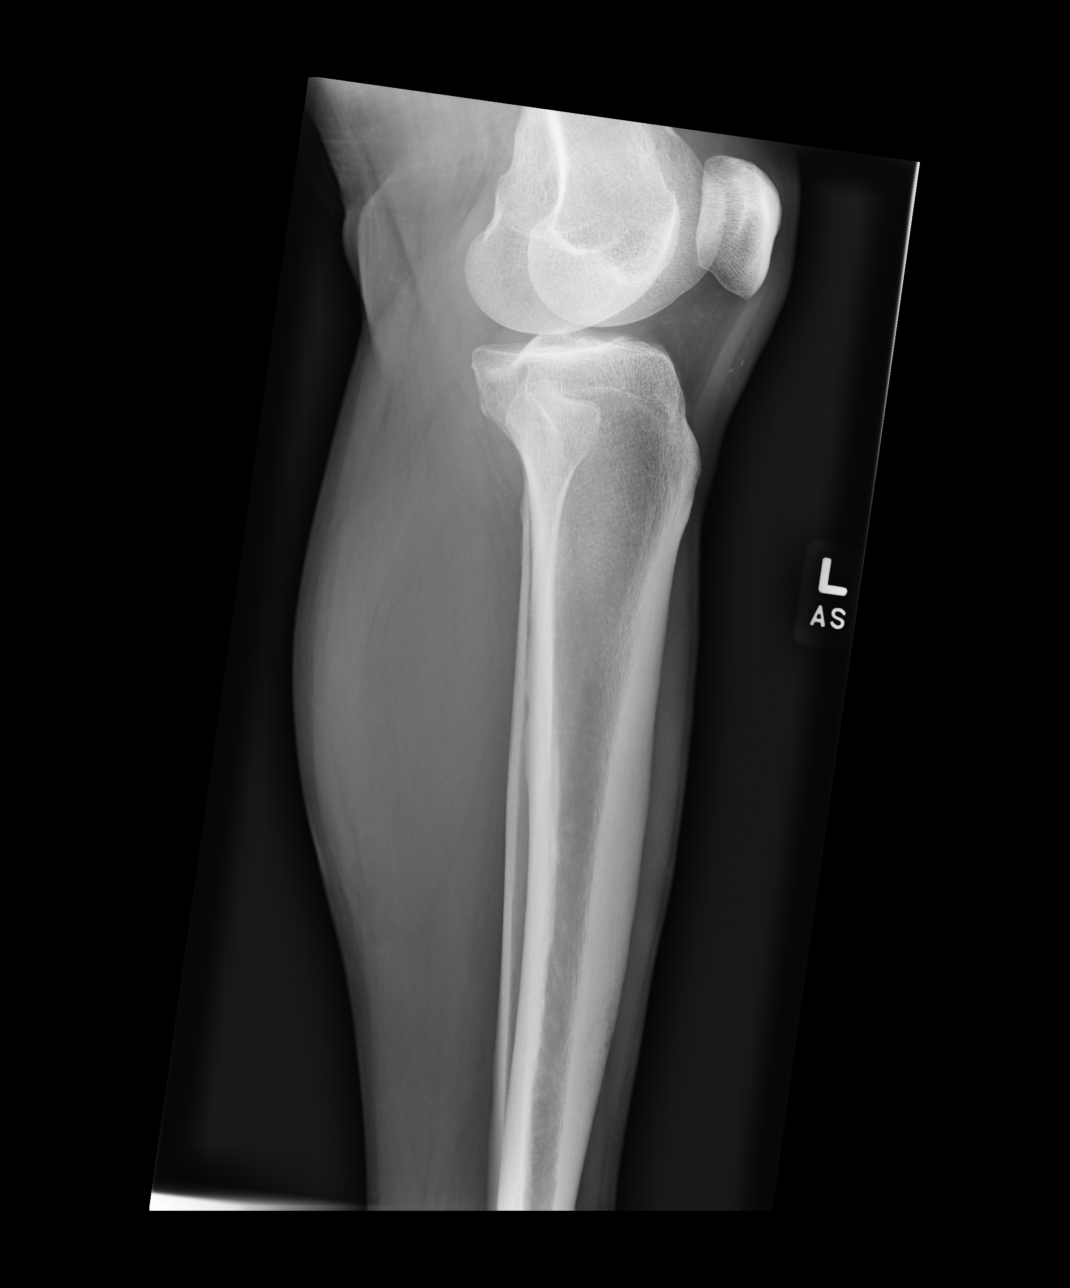

[4 of 4 positions shown; findings below may reference images not displayed]

FINDINGS: Cortical margins of the tibia and fibular intact. There is no
evidence of fracture or other focal bone lesions. Knee and ankle
alignment are maintained. Mild soft tissue edema with the lower
medial on leg. No soft tissue air or radiopaque foreign body.
IMPRESSION: No fracture of the left lower leg. Mild soft tissue edema.

## 2022-05-14 NOTE — Assessment & Plan Note (Signed)
Blood pressure well controlled with current dose of hydrochlorothiazide. No neurological/ophthalmologic signs/symptoms.  Continue current dose of hydrochlorothiazide.

## 2022-05-14 NOTE — Assessment & Plan Note (Addendum)
Refer to GI for colon cancer screening. May need [place] or possible follow up with Internal Medicine for [facility].

## 2022-05-14 NOTE — Assessment & Plan Note (Addendum)
Type 2 diabetes remains poorly controlled with most recent hemoglobin A1c of 12 despite multiple medications.  Is having abdominal cramping and darker stools with concern for semaglutide as a contributing factor and will give a 2-week break of semaglutide to see if this improves his abdominal symptoms.  Continue current dose of Sitagliptin and canagliflozin.  May need to consider long-acting insulin if blood sugars remain continually elevated and possible referral to Endocrinology.

## 2022-05-14 NOTE — Addendum Note (Signed)
Addended by: Mauricio Po D on: 05/14/2022 12:02 PM   Modules accepted: Orders

## 2022-05-14 NOTE — Assessment & Plan Note (Signed)
Generalized abdominal pain and darker stools in the setting of constipation and Ozempic use. Does not appear to have any acute/emergent symptoms. Will attempt medication holiday with Ozempic. Cannot rule out underlying IBS or relation to constipation. May need imaging and additional evaluation if symptoms worsen or do not improve.

## 2022-05-14 NOTE — Assessment & Plan Note (Signed)
Adam Chan continues to have well controlled virus and good adherence and tolerance to [employer].  Reviewed lab work and discussed plan of care.  Continue current dose of Biktarvy.  Financial assistance up-to-date.  Plan for follow-up in 3 months or sooner if needed with lab work 1 to 2 weeks prior to appointment.

## 2022-05-14 NOTE — Assessment & Plan Note (Signed)
Syphilis titer stable at 1:32. No current symptoms. Continue to monitor RPR.

## 2022-05-14 NOTE — Assessment & Plan Note (Signed)
Mood remains stable with current dose of Lexapro with no adverse side effects.  Denies suicidal ideations and no signs of psychosis.  Continue current dose of Lasix

## 2022-06-11 ENCOUNTER — Emergency Department (HOSPITAL_COMMUNITY): Payer: Self-pay

## 2022-06-11 ENCOUNTER — Emergency Department (HOSPITAL_COMMUNITY)
Admission: EM | Admit: 2022-06-11 | Discharge: 2022-06-11 | Disposition: A | Discharge status: Home / Self Care | Payer: Self-pay | Attending: Emergency Medicine | Admitting: Emergency Medicine

## 2022-06-11 ENCOUNTER — Encounter (HOSPITAL_COMMUNITY): Payer: Self-pay | Admitting: Emergency Medicine

## 2022-06-11 DIAGNOSIS — Z7984 Long term (current) use of oral hypoglycemic drugs: Secondary | ICD-10-CM | POA: Insufficient documentation

## 2022-06-11 DIAGNOSIS — S0990XA Unspecified injury of head, initial encounter: Secondary | ICD-10-CM

## 2022-06-11 DIAGNOSIS — I10 Essential (primary) hypertension: Secondary | ICD-10-CM | POA: Insufficient documentation

## 2022-06-11 DIAGNOSIS — Z79899 Other long term (current) drug therapy: Secondary | ICD-10-CM | POA: Insufficient documentation

## 2022-06-11 DIAGNOSIS — E109 Type 1 diabetes mellitus without complications: Secondary | ICD-10-CM | POA: Insufficient documentation

## 2022-06-11 DIAGNOSIS — Z794 Long term (current) use of insulin: Secondary | ICD-10-CM | POA: Insufficient documentation

## 2022-06-11 DIAGNOSIS — Z21 Asymptomatic human immunodeficiency virus [HIV] infection status: Secondary | ICD-10-CM | POA: Insufficient documentation

## 2022-06-11 DIAGNOSIS — Z87891 Personal history of nicotine dependence: Secondary | ICD-10-CM | POA: Insufficient documentation

## 2022-06-11 DIAGNOSIS — Y92481 Parking lot as the place of occurrence of the external cause: Secondary | ICD-10-CM | POA: Insufficient documentation

## 2022-06-11 DIAGNOSIS — S0240EA Zygomatic fracture, right side, initial encounter for closed fracture: Secondary | ICD-10-CM | POA: Insufficient documentation

## 2022-06-11 MED ORDER — OXYCODONE HCL 5 MG PO TABS: 5.0000 mg | ORAL_TABLET | ORAL | 0 refills | Status: DC | PRN

## 2022-06-11 MED ORDER — ACETAMINOPHEN 325 MG PO TABS: 650.0000 mg | ORAL_TABLET | Freq: Four times a day (QID) | ORAL | 0 refills | Status: AC | PRN

## 2022-06-11 MED ORDER — SODIUM CHLORIDE 0.9 % IV BOLUS
1000.0000 mL | Freq: Once | INTRAVENOUS | Status: AC
  Administered 2022-06-11: 1000 mL via INTRAVENOUS

## 2022-06-11 MED ORDER — ONDANSETRON HCL 4 MG/2ML IJ SOLN
4.0000 mg | Freq: Once | INTRAMUSCULAR | Status: AC
  Administered 2022-06-11: 4 mg via INTRAVENOUS
  Filled 2022-06-11: qty 2

## 2022-06-11 MED ORDER — HYDROMORPHONE HCL 1 MG/ML IJ SOLN
1.0000 mg | Freq: Once | INTRAMUSCULAR | Status: AC
  Administered 2022-06-11: 1 mg via INTRAVENOUS
  Filled 2022-06-11: qty 1

## 2022-06-11 MED ORDER — ONDANSETRON HCL 4 MG PO TABS: 4.0000 mg | ORAL_TABLET | ORAL | 0 refills | Status: DC | PRN
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
## 2022-06-11 NOTE — ED Notes (Signed)
Pt transported to CT ?
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
## 2022-06-12 ENCOUNTER — Encounter (HOSPITAL_COMMUNITY): Payer: Self-pay | Admitting: Emergency Medicine

## 2022-08-11 ENCOUNTER — Other Ambulatory Visit: Payer: Self-pay

## 2022-08-11 DIAGNOSIS — B20 Human immunodeficiency virus [HIV] disease: Secondary | ICD-10-CM

## 2022-08-11 DIAGNOSIS — E1165 Type 2 diabetes mellitus with hyperglycemia: Secondary | ICD-10-CM

## 2022-08-11 DIAGNOSIS — A539 Syphilis, unspecified: Secondary | ICD-10-CM

## 2022-08-12 ENCOUNTER — Other Ambulatory Visit: Payer: Self-pay

## 2022-08-12 ENCOUNTER — Encounter (INDEPENDENT_AMBULATORY_CARE_PROVIDER_SITE_OTHER): Payer: Self-pay

## 2022-08-12 DIAGNOSIS — E1165 Type 2 diabetes mellitus with hyperglycemia: Secondary | ICD-10-CM

## 2022-08-12 LAB — T-HELPER CELL (CD4) - (RCID CLINIC ONLY)
CD4 % Helper T Cell: 48 % (ref 33–65)
CD4 T Cell Abs: 983 /uL (ref 400–1790)

## 2022-08-12 MED ORDER — TRUE METRIX METER W/DEVICE KIT
PACK | 0 refills | Status: AC
  Filled 2022-08-12: qty 1, 30d supply, fill #0

## 2022-08-12 MED ORDER — INSULIN GLARGINE 100 UNIT/ML ~~LOC~~ SOLN: 12.0000 [IU] | Freq: Every day | SUBCUTANEOUS | 2 refills | Status: DC

## 2022-08-12 MED ORDER — TRUE METRIX BLOOD GLUCOSE TEST VI STRP
ORAL_STRIP | 12 refills | Status: DC
  Filled 2022-08-12: qty 100, 30d supply, fill #0
  Filled 2022-11-10 – 2023-01-12 (×2): qty 100, 30d supply, fill #1

## 2022-08-12 MED ORDER — TRUEPLUS LANCETS 28G MISC
12 refills | Status: DC
  Filled 2022-08-12: qty 100, 30d supply, fill #0
  Filled 2022-11-10: qty 100, 30d supply, fill #1

## 2022-08-12 MED ORDER — TRUEPLUS PEN NEEDLES 32G X 4 MM MISC: 2 refills | Status: DC

## 2022-08-12 NOTE — Telephone Encounter (Signed)
Please see the MyChart message reply(ies) for my assessment and plan.    This patient gave consent for this Medical Advice Message and is aware that it may result in a bill to [employer], as well as the possibility of receiving a bill for a co-payment or deductible. They are an established patient, but are not seeking medical advice exclusively about a problem treated during an in person or video visit in the last seven days. I did not recommend an in person or video visit within seven days of my reply.    I spent a total of 20 minutes cumulative time within 7 days through [employer].  Terri Piedra, NP 08/12/2022 3:11 PM

## 2022-08-12 NOTE — Addendum Note (Signed)
Addended by: Mauricio Po D on: 08/12/2022 03:13 PM   Modules accepted: Orders

## 2022-08-13 ENCOUNTER — Other Ambulatory Visit: Payer: Self-pay

## 2022-08-13 LAB — COMPLETE METABOLIC PANEL WITH GFR
AG Ratio: 1.5 (calc) (ref 1.0–2.5)
ALT: 44 U/L (ref 9–46)
AST: 39 U/L — ABNORMAL HIGH (ref 10–35)
Albumin: 4.7 g/dL (ref 3.6–5.1)
Alkaline phosphatase (APISO): 89 U/L (ref 35–144)
BUN: 18 mg/dL (ref 7–25)
CO2: 23 mmol/L (ref 20–32)
Calcium: 9.6 mg/dL (ref 8.6–10.3)
Chloride: 94 mmol/L — ABNORMAL LOW (ref 98–110)
Creat: 0.85 mg/dL (ref 0.70–1.30)
Globulin: 3.1 g/dL (calc) (ref 1.9–3.7)
Glucose, Bld: 339 mg/dL — ABNORMAL HIGH (ref 65–99)
Potassium: 3.6 mmol/L (ref 3.5–5.3)
Sodium: 131 mmol/L — ABNORMAL LOW (ref 135–146)
Total Bilirubin: 0.8 mg/dL (ref 0.2–1.2)
Total Protein: 7.8 g/dL (ref 6.1–8.1)
eGFR: 103 mL/min/{1.73_m2} (ref >=60)

## 2022-08-13 LAB — HEMOGLOBIN A1C
Hgb A1c MFr Bld: 12.4 % of total Hgb — ABNORMAL HIGH (ref <5.7)
Mean Plasma Glucose: 309 mg/dL
eAG (mmol/L): 17.1 mmol/L

## 2022-08-13 LAB — HIV-1 RNA QUANT-NO REFLEX-BLD
HIV 1 RNA Quant: <20 Copies/mL — ABNORMAL HIGH
HIV-1 RNA Quant, Log: <1.3 Log cps/mL — ABNORMAL HIGH

## 2022-08-13 LAB — RPR TITER: RPR Titer: 1:16 {titer} — ABNORMAL HIGH

## 2022-08-13 LAB — T PALLIDUM AB: T Pallidum Abs: POSITIVE — AB

## 2022-08-13 LAB — RPR: RPR Ser Ql: REACTIVE — AB

## 2022-08-13 MED ORDER — DAPAGLIFLOZIN PROPANEDIOL 5 MG PO TABS: 5.0000 mg | ORAL_TABLET | Freq: Every day | ORAL | 2 refills | Status: DC

## 2022-08-13 NOTE — Addendum Note (Signed)
Addended by: Jeanine Luz D on: 08/13/2022 09:07 AM   Modules accepted: Orders

## 2022-08-16 ENCOUNTER — Other Ambulatory Visit: Payer: Self-pay

## 2022-08-16 DIAGNOSIS — E1165 Type 2 diabetes mellitus with hyperglycemia: Secondary | ICD-10-CM

## 2022-08-16 MED ORDER — INSULIN GLARGINE 100 UNITS/ML SOLOSTAR PEN: 12.0000 [IU] | PEN_INJECTOR | Freq: Every day | SUBCUTANEOUS | 2 refills | Status: DC

## 2022-08-25 ENCOUNTER — Other Ambulatory Visit: Payer: Self-pay

## 2022-08-25 ENCOUNTER — Ambulatory Visit (INDEPENDENT_AMBULATORY_CARE_PROVIDER_SITE_OTHER): Payer: Self-pay | Admitting: Family

## 2022-08-25 ENCOUNTER — Encounter: Payer: Self-pay | Admitting: Family

## 2022-08-25 VITALS — BP 105/71 | HR 95 | Resp 16 | Ht 67.0 in | Wt 165.0 lb

## 2022-08-25 DIAGNOSIS — E1165 Type 2 diabetes mellitus with hyperglycemia: Secondary | ICD-10-CM

## 2022-08-25 DIAGNOSIS — B20 Human immunodeficiency virus [HIV] disease: Secondary | ICD-10-CM

## 2022-08-25 DIAGNOSIS — R748 Abnormal levels of other serum enzymes: Secondary | ICD-10-CM

## 2022-08-25 DIAGNOSIS — A539 Syphilis, unspecified: Secondary | ICD-10-CM

## 2022-08-25 DIAGNOSIS — Z0000 Encounter for general adult medical examination without abnormal findings: Secondary | ICD-10-CM

## 2022-08-25 DIAGNOSIS — F3342 Major depressive disorder, recurrent, in full remission: Secondary | ICD-10-CM

## 2022-08-25 MED ORDER — PITAVASTATIN MAGNESIUM 4 MG PO TABS: 4.0000 mg | ORAL_TABLET | Freq: Every day | ORAL | 6 refills | Status: DC

## 2022-08-25 MED ORDER — DAPAGLIFLOZIN PROPANEDIOL 5 MG PO TABS: 5.0000 mg | ORAL_TABLET | Freq: Every day | ORAL | 2 refills | Status: DC

## 2022-08-25 MED ORDER — BIKTARVY 50-200-25 MG PO TABS: 1.0000 | ORAL_TABLET | Freq: Every day | ORAL | 5 refills | Status: DC

## 2022-08-25 MED ORDER — ESCITALOPRAM OXALATE 20 MG PO TABS: ORAL_TABLET | ORAL | 5 refills | Status: DC

## 2022-08-25 MED ORDER — HYDROCHLOROTHIAZIDE 25 MG PO TABS: ORAL_TABLET | ORAL | 5 refills | Status: DC

## 2022-08-25 MED ORDER — INSULIN GLARGINE 100 UNIT/ML SOLOSTAR PEN: 12.0000 [IU] | PEN_INJECTOR | Freq: Every day | SUBCUTANEOUS | 1 refills | Status: DC

## 2022-08-25 NOTE — Patient Instructions (Addendum)
Nice to see you.  Continue to take your medication daily as prescribed.  Refills have been sent to the pharmacy.  Plan for follow up in 3 months or sooner if needed with lab work 1-2 weeks prior to appointment.   Have a great day and stay safe!   Increase daily dose by 2 to 4 units or by 10% to 20% every 2 to 3 days to achieve fasting plasma glucose target while avoiding hypoglycemia

## 2022-08-25 NOTE — Progress Notes (Unsigned)
Brief Narrative   Patient ID: Adam Chan, male    DOB: 04-Oct-1970, 52 y.o.   MRN: [id number]  [name] is a 52 y/o Hispanic male diagnosed with HIV disease around September 2018 with risk factor of bisexual contact. Initial CD4 count and viral load are unavailable. Genotype on 10/19/16 with K103N. No history of opportunistic infection. [id number] negative. ART regimen initially Genvoya which caused nausea and changed to [employer].    Subjective:    Chief Complaint  Patient presents with   Follow-up    HPI:  Adam Chan is a 52 y.o. male with HIV disease and Type 2 diabetes last seen on 05/13/22 with well controlled virus and poorly controlled diabetes. Viral load was undetectable and CD4 count 1,105 and A1c was 12. Liver function tests mildly elevated with normal renal function and electrolytes. Most recent lab work completed on 08/11/22 with well controlled virus and CD4 count of 983 and A1c of 12.4. In the interim has been started on Lantus 12 units daily and Invokana has been changed to [place]. Here today for follow up.  Adam Chan has been doing okay since his last office visit. Frustrated with his blood sugars. Continues to take Biktarvy as prescribed with no adverse side effects. Has started taking Lantus 12 units daily and fasting blood sugars have decreased to the 250 range. Denies any hypoglycemic readings. Condoms and STD testing offered.  Denies fevers, chills, night sweats, headaches, changes in vision, neck pain/stiffness, nausea, diarrhea, vomiting, lesions or rashes.   Allergies  Allergen Reactions   Elemental Sulfur Other (See Comments)   Sulfa Antibiotics Anaphylaxis      Outpatient Medications Prior to Visit  Medication Sig Dispense Refill   acetaminophen (TYLENOL) 325 MG tablet Take 2 tablets (650 mg total) by mouth every 6 (six) hours as needed. 36 tablet 0   Blood Glucose Monitoring Suppl (TRUE METRIX METER) w/Device KIT Use to check blood sugars each  morning and as directed. 1 kit 0   glucose blood (TRUE METRIX BLOOD GLUCOSE TEST) test strip Use as instructed 100 each 12   Insulin Pen Needle (TRUEPLUS PEN NEEDLES) 32G X 4 MM MISC Use 1 needle per injection of Lantus daily. Do not reuse needles. 30 each 2   omeprazole (PRILOSEC) 20 MG capsule TAKE 1 CAPSULE BY MOUTH DAILY 30 capsule 5   ondansetron (ZOFRAN) 4 MG tablet Take 1 tablet (4 mg total) by mouth every 4 (four) hours as needed for nausea or vomiting. 6 tablet 0   ondansetron (ZOFRAN) 8 MG tablet TAKE 1 TABLET(8 MG) BY MOUTH EVERY 8 HOURS AS NEEDED FOR NAUSEA 20 tablet 5   Semaglutide, 1 MG/DOSE, 4 MG/3ML SOPN Inject 1 mg into the skin once a week. 3 mL 12   TRUEplus Lancets 28G MISC Use as instructed 100 each 12   bictegravir-emtricitabine-tenofovir AF (BIKTARVY) 50-200-25 MG TABS tablet Take 1 tablet by mouth daily. 30 tablet 5   dapagliflozin propanediol (FARXIGA) 5 MG TABS tablet Take 1 tablet (5 mg total) by mouth daily before breakfast. 30 tablet 2   escitalopram (LEXAPRO) 20 MG tablet Take 1 tablet by mouth daily. 30 tablet 5   hydrochlorothiazide (HYDRODIURIL) 25 MG tablet Take 1 tablet by mouth daily. 30 tablet 5   insulin glargine (LANTUS) 100 unit/mL SOPN Inject 12 Units into the skin daily. 15 mL 2   Pitavastatin Magnesium 4 MG TABS Take 4 mg by mouth daily. 30 tablet 11   sitaGLIPtin (JANUVIA) 100 MG tablet TAKE  1 TABLET(100 MG) BY MOUTH DAILY 30 tablet 5   amoxicillin (AMOXIL) 875 MG tablet Take 875 mg by mouth 2 (two) times daily. (Patient not taking: Reported on 08/25/2022)     oxyCODONE (ROXICODONE) 5 MG immediate release tablet Take 1 tablet (5 mg total) by mouth every 4 (four) hours as needed for severe pain. (Patient not taking: Reported on 08/25/2022) 15 tablet 0   No facility-administered medications prior to visit.     Past Medical History:  Diagnosis Date   Depression 04/06/2018   Diabetes type 1, controlled 04/06/2018   HIV (human immunodeficiency virus  infection) 04/06/2018   Hypertension    Nausea in adult 04/06/2018     Past Surgical History:  Procedure Laterality Date   Bilaterla mesh for Hernia Bilateral 2009   CHOLECYSTECTOMY        Review of Systems  Constitutional:  Negative for appetite change, chills, fatigue, fever and unexpected weight change.  Eyes:  Negative for visual disturbance.  Respiratory:  Negative for cough, chest tightness, shortness of breath and wheezing.   Cardiovascular:  Negative for chest pain and leg swelling.  Gastrointestinal:  Negative for abdominal pain, constipation, diarrhea, nausea and vomiting.  Genitourinary:  Negative for dysuria, flank pain, frequency, genital sores, hematuria and urgency.  Skin:  Negative for rash.  Allergic/Immunologic: Negative for immunocompromised state.  Neurological:  Negative for dizziness and headaches.      Objective:    BP 105/71   Pulse 95   Resp 16   Ht 5\' 7"  (1.702 m)   Wt 165 lb (74.8 kg)   SpO2 95%   BMI 25.84 kg/m  Nursing note and vital signs reviewed.  Physical Exam Constitutional:      General: He is not in acute distress.    Appearance: He is well-developed.  Eyes:     Conjunctiva/sclera: Conjunctivae normal.  Cardiovascular:     Rate and Rhythm: Normal rate and regular rhythm.     Heart sounds: Normal heart sounds. No murmur heard.    No friction rub. No gallop.  Pulmonary:     Effort: Pulmonary effort is normal. No respiratory distress.     Breath sounds: Normal breath sounds. No wheezing or rales.  Chest:     Chest wall: No tenderness.  Abdominal:     General: Bowel sounds are normal.     Palpations: Abdomen is soft.     Tenderness: There is no abdominal tenderness.  Musculoskeletal:     Cervical back: Neck supple.  Lymphadenopathy:     Cervical: No cervical adenopathy.  Skin:    General: Skin is warm and dry.     Findings: No rash.  Neurological:     Mental Status: He is alert and oriented to person, place, and time.   Psychiatric:        Behavior: Behavior normal.        Thought Content: Thought content normal.        Judgment: Judgment normal.         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   Type 2 diabetes mellitus with hyperglycemia, without long-term current use of insulin 06/02/2018   Recurrent major depressive disorder, in full remission 06/02/2018   Essential hypertension 06/02/2018   Healthcare maintenance 06/02/2018   Nausea 02/27/2018   Elevated liver enzymes 09/27/2017   Depression 08/30/2017   DOE (dyspnea on exertion) 08/30/2017   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 Have discontinued Januvia and changed Invokana to [place] and started Lantus Solostar Pen 12 units daily with increasing 2-4 units every 2-3 days as needed up to 24 units until fasting blood sugar is 150.  Continue current dose of [place] and Semaglutide. Plan for follow up in 3 months or sooner if needed.       Relevant Medications   insulin glargine (LANTUS) 100 UNIT/ML Solostar Pen   Pitavastatin Magnesium 4 MG TABS   dapagliflozin propanediol (FARXIGA) 5 MG TABS tablet     Other   Human immunodeficiency  virus (HIV) disease - Primary   Relevant Medications   bictegravir-emtricitabine-tenofovir AF (BIKTARVY) 50-200-25 MG TABS tablet   Recurrent major depressive disorder, in full remission    Stable with current dose of Lexapro and no adverse side effects. No suicidal ideations or signs of psychosis. Continue current dose of Lexapro.       Relevant Medications   escitalopram (LEXAPRO) 20 MG tablet   Healthcare maintenance    Discussed importance of safe sexual practice and condom use. Condoms and STD testing offered.  Vaccinations up to date.  Due for colon cancer screening.       Syphilis    RPR titer down to 1:16 with no current evidence of infection. Reviewed natural downward progression of RPR. Continue to monitor.       Relevant Medications   bictegravir-emtricitabine-tenofovir AF (BIKTARVY) 50-200-25 MG TABS tablet   Elevated liver enzymes    AST and ALT improved. Will continue to monitor.         I have discontinued Zavian Snare's sitaGLIPtin and insulin glargine. I have also changed his Pitavastatin Magnesium. Additionally, I am having him start on insulin glargine. Lastly, I am having him maintain his Semaglutide (1 MG/DOSE), omeprazole, ondansetron, oxyCODONE, ondansetron, acetaminophen, TRUEplus Pen Needles, True Metrix Meter, True Metrix Blood Glucose Test, TRUEplus Lancets 28G, amoxicillin, Biktarvy, dapagliflozin propanediol, escitalopram, and hydrochlorothiazide.   Meds ordered this encounter  Medications   insulin glargine (LANTUS) 100 UNIT/ML Solostar Pen    Sig: Inject 12-24 Units into the skin daily. As instructed per fasting blood sugar    Dispense:  15 mL    Refill:  1    Order Specific Question:   Supervising Provider    Answer:   Drue Second, CYNTHIA [[id number]]   Pitavastatin Magnesium 4 MG TABS    Sig: Take 1 tablet (4 mg total) by mouth daily.    Dispense:  30 tablet    Refill:  6    Order Specific Question:   Supervising  Provider    Answer:   Judyann Munson [[id number]]   bictegravir-emtricitabine-tenofovir AF (BIKTARVY) 50-200-25 MG TABS tablet    Sig: Take 1 tablet by mouth daily.    Dispense:  30 tablet    Refill:  5    Order Specific Question:   Supervising Provider    Answer:   Drue Second, CYNTHIA [[id number]]   dapagliflozin propanediol (FARXIGA) 5 MG TABS tablet    Sig: Take 1 tablet (5 mg total) by mouth daily before breakfast.    Dispense:  30 tablet    Refill:  2    Discontinue Invokana    Order Specific Question:   Supervising Provider    Answer:   Drue Second, CYNTHIA [[id number]]   escitalopram (LEXAPRO) 20 MG tablet    Sig: Take 1 tablet by mouth daily.    Dispense:  30 tablet    Refill:  5    Order Specific Question:   Supervising Provider    Answer:   Drue Second, CYNTHIA [[id number]]   hydrochlorothiazide (HYDRODIURIL) 25 MG tablet    Sig: Take 1 tablet by mouth daily.    Dispense:  30 tablet    Refill:  5    Order Specific Question:   Supervising Provider    Answer:   Judyann Munson [[id number]]     Follow-up: No follow-ups on file.   Marcos Eke, MSN, FNP-C Nurse Practitioner [facility] Main number: [phone]

## 2022-08-26 ENCOUNTER — Encounter: Payer: Self-pay | Admitting: Family

## 2022-08-26 NOTE — Assessment & Plan Note (Signed)
AST and ALT improved. Will continue to monitor.

## 2022-08-26 NOTE — Assessment & Plan Note (Signed)
Adam Chan's A1c continued to creep up with A1c up to 12.4 despite current medication with semaglutide, Invokana and Januvia. Have discontinued Januvia and changed Invokana to [place] and started Lantus Solostar Pen 12 units daily with increasing 2-4 units every 2-3 days as needed up to 24 units until fasting blood sugar is 150.  Continue current dose of [place] and Semaglutide. Plan for follow up in 3 months or sooner if needed.

## 2022-08-26 NOTE — Assessment & Plan Note (Signed)
Discussed importance of safe sexual practice and condom use. Condoms and STD testing offered.  Vaccinations up to date.  Due for colon cancer screening.

## 2022-08-26 NOTE — Assessment & Plan Note (Signed)
RPR titer down to 1:16 with no current evidence of infection. Reviewed natural downward progression of RPR. Continue to monitor.

## 2022-08-26 NOTE — Assessment & Plan Note (Signed)
Stable with current dose of Lexapro and no adverse side effects. No suicidal ideations or signs of psychosis. Continue current dose of Lexapro.

## 2022-09-09 ENCOUNTER — Telehealth: Payer: Self-pay

## 2022-09-09 ENCOUNTER — Other Ambulatory Visit (HOSPITAL_COMMUNITY): Payer: Self-pay

## 2022-09-09 NOTE — Telephone Encounter (Signed)
Called pt for copy of insurance card. Pt said that he cancelled that he never received a card. I was advising him that [employer] still shows active and he would need to call them again. He stated that he did not have it and hung up on me.

## 2022-11-04 ENCOUNTER — Other Ambulatory Visit: Payer: Self-pay | Admitting: Family

## 2022-11-04 DIAGNOSIS — E1165 Type 2 diabetes mellitus with hyperglycemia: Secondary | ICD-10-CM

## 2022-11-04 DIAGNOSIS — B20 Human immunodeficiency virus [HIV] disease: Secondary | ICD-10-CM

## 2022-11-04 DIAGNOSIS — A539 Syphilis, unspecified: Secondary | ICD-10-CM

## 2022-11-09 ENCOUNTER — Ambulatory Visit: Payer: 59

## 2022-11-09 ENCOUNTER — Other Ambulatory Visit: Payer: 59

## 2022-11-09 ENCOUNTER — Other Ambulatory Visit (HOSPITAL_COMMUNITY): Payer: Self-pay

## 2022-11-09 ENCOUNTER — Other Ambulatory Visit: Payer: Self-pay

## 2022-11-09 DIAGNOSIS — E1165 Type 2 diabetes mellitus with hyperglycemia: Secondary | ICD-10-CM

## 2022-11-09 DIAGNOSIS — B20 Human immunodeficiency virus [HIV] disease: Secondary | ICD-10-CM

## 2022-11-09 DIAGNOSIS — A539 Syphilis, unspecified: Secondary | ICD-10-CM

## 2022-11-10 ENCOUNTER — Other Ambulatory Visit: Payer: Self-pay

## 2022-11-10 LAB — T-HELPER CELL (CD4) - (RCID CLINIC ONLY)
CD4 % Helper T Cell: 44 % (ref 33–65)
CD4 T Cell Abs: 960 /uL (ref 400–1790)

## 2022-11-11 LAB — RPR TITER: RPR Titer: 1:32 {titer} — ABNORMAL HIGH

## 2022-11-12 LAB — COMPLETE METABOLIC PANEL WITH GFR
AG Ratio: 1.5 (calc) (ref 1.0–2.5)
ALT: 41 U/L (ref 9–46)
AST: 39 U/L — ABNORMAL HIGH (ref 10–35)
Albumin: 4.7 g/dL (ref 3.6–5.1)
Alkaline phosphatase (APISO): 98 U/L (ref 35–144)
BUN: 13 mg/dL (ref 7–25)
CO2: 25 mmol/L (ref 20–32)
Calcium: 9.6 mg/dL (ref 8.6–10.3)
Chloride: 98 mmol/L (ref 98–110)
Creat: 0.9 mg/dL (ref 0.70–1.30)
Globulin: 3.1 g/dL (calc) (ref 1.9–3.7)
Glucose, Bld: 352 mg/dL — ABNORMAL HIGH (ref 65–99)
Potassium: 3.8 mmol/L (ref 3.5–5.3)
Sodium: 134 mmol/L — ABNORMAL LOW (ref 135–146)
Total Bilirubin: 1.5 mg/dL — ABNORMAL HIGH (ref 0.2–1.2)
Total Protein: 7.8 g/dL (ref 6.1–8.1)
eGFR: 101 mL/min/{1.73_m2} (ref >=60)

## 2022-11-12 LAB — LIPID PANEL
Cholesterol: 157 mg/dL (ref <200)
HDL: 66 mg/dL (ref >=40)
LDL Cholesterol (Calc): 71 mg/dL (calc)
Non-HDL Cholesterol (Calc): 91 mg/dL (calc) (ref <130)
Total CHOL/HDL Ratio: 2.4 (calc) (ref <5.0)
Triglycerides: 117 mg/dL (ref <150)

## 2022-11-12 LAB — RPR: RPR Ser Ql: REACTIVE — AB

## 2022-11-12 LAB — HEMOGLOBIN A1C
Hgb A1c MFr Bld: 11.4 % of total Hgb — ABNORMAL HIGH (ref <5.7)
Mean Plasma Glucose: 280 mg/dL
eAG (mmol/L): 15.5 mmol/L

## 2022-11-12 LAB — HIV-1 RNA QUANT-NO REFLEX-BLD
HIV 1 RNA Quant: 75 Copies/mL — ABNORMAL HIGH
HIV-1 RNA Quant, Log: 1.88 Log cps/mL — ABNORMAL HIGH

## 2022-11-12 LAB — T PALLIDUM AB: T Pallidum Abs: POSITIVE — AB

## 2022-11-18 ENCOUNTER — Other Ambulatory Visit (HOSPITAL_COMMUNITY): Payer: Self-pay

## 2022-11-22 ENCOUNTER — Ambulatory Visit: Payer: 59

## 2022-11-22 ENCOUNTER — Other Ambulatory Visit (HOSPITAL_COMMUNITY): Payer: Self-pay

## 2022-11-22 ENCOUNTER — Encounter: Payer: Self-pay | Admitting: Family

## 2022-11-22 ENCOUNTER — Other Ambulatory Visit: Payer: Self-pay

## 2022-11-22 ENCOUNTER — Ambulatory Visit (INDEPENDENT_AMBULATORY_CARE_PROVIDER_SITE_OTHER): Payer: 59 | Admitting: Family

## 2022-11-22 VITALS — BP 126/81 | HR 101 | Temp 98.0°F | Ht 67.0 in | Wt 172.0 lb

## 2022-11-22 DIAGNOSIS — F3342 Major depressive disorder, recurrent, in full remission: Secondary | ICD-10-CM

## 2022-11-22 DIAGNOSIS — Z0000 Encounter for general adult medical examination without abnormal findings: Secondary | ICD-10-CM

## 2022-11-22 DIAGNOSIS — B20 Human immunodeficiency virus [HIV] disease: Secondary | ICD-10-CM

## 2022-11-22 DIAGNOSIS — I10 Essential (primary) hypertension: Secondary | ICD-10-CM | POA: Diagnosis not present

## 2022-11-22 DIAGNOSIS — Z7985 Long-term (current) use of injectable non-insulin antidiabetic drugs: Secondary | ICD-10-CM

## 2022-11-22 DIAGNOSIS — Z794 Long term (current) use of insulin: Secondary | ICD-10-CM

## 2022-11-22 DIAGNOSIS — E1165 Type 2 diabetes mellitus with hyperglycemia: Secondary | ICD-10-CM | POA: Diagnosis not present

## 2022-11-22 MED ORDER — ESCITALOPRAM OXALATE 20 MG PO TABS
ORAL_TABLET | ORAL | 5 refills | Status: DC
Start: 2022-11-22 — End: 2023-07-07

## 2022-11-22 MED ORDER — HYDROCHLOROTHIAZIDE 25 MG PO TABS: ORAL_TABLET | ORAL | 5 refills | Status: DC

## 2022-11-22 MED ORDER — INSULIN GLARGINE 100 UNIT/ML SOLOSTAR PEN: 20.0000 [IU] | PEN_INJECTOR | Freq: Every day | SUBCUTANEOUS | 2 refills | Status: DC

## 2022-11-22 MED ORDER — DAPAGLIFLOZIN PROPANEDIOL 5 MG PO TABS: 5.0000 mg | ORAL_TABLET | Freq: Every day | ORAL | 5 refills | Status: DC

## 2022-11-22 MED ORDER — BIKTARVY 50-200-25 MG PO TABS
1.0000 | ORAL_TABLET | Freq: Every day | ORAL | 5 refills | Status: DC
Start: 2022-11-22 — End: 2023-05-30

## 2022-11-22 NOTE — Assessment & Plan Note (Signed)
Mr. Lalor has improved hemoglobin A1c down to 11.4 from 12.4 with the addition of insulin and continuation of semaglutide and [place].  Instructions provided to increase Lantus 2 to 3 units every 2 to 3 days as needed for blood sugars greater than 180 in the morning with max dose of 4 units before contacting provider.  Continue current dose of semaglutide and [place].

## 2022-11-22 NOTE — Assessment & Plan Note (Signed)
Adam Chan continues to have well-controlled virus with good adherence and tolerance to [employer].  Reviewed lab work and discussed U equals U and plan of care.  Continue current dose of Biktarvy.  Plan for follow-up in 3 months or sooner if needed with lab work 1 to 2 weeks prior to appointment.

## 2022-11-22 NOTE — Progress Notes (Signed)
Brief Narrative   Patient ID: Adam Chan, male    DOB: Oct 05, 1970, 52 y.o.   MRN: [id number]  Adam Chan is a 52 y/o Hispanic male diagnosed with HIV disease around September 2018 with risk factor of bisexual contact. Initial CD4 count and viral load are unavailable. Genotype on 10/19/16 with K103N. No history of opportunistic infection. [id number] negative. ART regimen initially Genvoya which caused nausea and changed to [employer].    Subjective:    Chief Complaint  Patient presents with   HIV Positive/AIDS   Diabetes    HPI:  Adam Chan is a 52 y.o. male with HIV disease and diabetes last seen on 08/25/2022 with well-controlled HIV disease and less than optimally controlled diabetes.  HIV viral load was undetectable with CD4 count 983.  Kidney function, liver function, electrolytes within normal ranges.  Most recent lab work completed on 11/09/2022 with viral load that remains undetectable and CD4 count of 960.  Hemoglobin A1c down to 11.4 from 12.4.  Kidney function, liver function, electrolytes within normal ranges.  Here today for follow-up.  Adam Chan has been doing okay since his last office visit and has continued to take semaglutide as well as insulin for diabetes and continues Biktarvy for HIV disease.  Currently at 20 units of Lantus daily.  Blood sugars have remained elevated although improved.  Denies excessive hunger, thirst, or urination.  May have incidentally signed up for insurance.  Condoms and STD testing offered.  Denies fevers, chills, night sweats, headaches, changes in vision, neck pain/stiffness, nausea, diarrhea, vomiting, lesions or rashes.   Allergies  Allergen Reactions   Elemental Sulfur Other (See Comments)   Sulfa Antibiotics Anaphylaxis      Outpatient Medications Prior to Visit  Medication Sig Dispense Refill   acetaminophen (TYLENOL) 325 MG tablet Take 2 tablets (650 mg total) by mouth every 6 (six) hours as needed. 36 tablet 0   Blood  Glucose Monitoring Suppl (TRUE METRIX METER) w/Device KIT Use to check blood sugars each morning and as directed. 1 kit 0   glucose blood (TRUE METRIX BLOOD GLUCOSE TEST) test strip Use as instructed 100 each 12   Insulin Pen Needle (TRUEPLUS PEN NEEDLES) 32G X 4 MM MISC Use 1 needle per injection of Lantus daily. Do not reuse needles. 30 each 2   omeprazole (PRILOSEC) 20 MG capsule TAKE 1 CAPSULE BY MOUTH DAILY 30 capsule 5   ondansetron (ZOFRAN) 4 MG tablet Take 1 tablet (4 mg total) by mouth every 4 (four) hours as needed for nausea or vomiting. 6 tablet 0   ondansetron (ZOFRAN) 8 MG tablet TAKE 1 TABLET(8 MG) BY MOUTH EVERY 8 HOURS AS NEEDED FOR NAUSEA 20 tablet 5   Pitavastatin Magnesium 4 MG TABS Take 1 tablet (4 mg total) by mouth daily. 30 tablet 6   Semaglutide, 1 MG/DOSE, 4 MG/3ML SOPN Inject 1 mg into the skin once a week. 3 mL 12   TRUEplus Lancets 28G MISC Use as instructed 100 each 12   bictegravir-emtricitabine-tenofovir AF (BIKTARVY) 50-200-25 MG TABS tablet Take 1 tablet by mouth daily. 30 tablet 5   dapagliflozin propanediol (FARXIGA) 5 MG TABS tablet Take 1 tablet (5 mg total) by mouth daily before breakfast. 30 tablet 2   escitalopram (LEXAPRO) 20 MG tablet Take 1 tablet by mouth daily. 30 tablet 5   hydrochlorothiazide (HYDRODIURIL) 25 MG tablet Take 1 tablet by mouth daily. 30 tablet 5   insulin glargine (LANTUS) 100 UNIT/ML Solostar Pen Inject 12-24  Units into the skin daily. As instructed per fasting blood sugar 15 mL 1   amoxicillin (AMOXIL) 875 MG tablet Take 875 mg by mouth 2 (two) times daily. (Patient not taking: Reported on 08/25/2022)     oxyCODONE (ROXICODONE) 5 MG immediate release tablet Take 1 tablet (5 mg total) by mouth every 4 (four) hours as needed for severe pain. (Patient not taking: Reported on 08/25/2022) 15 tablet 0   No facility-administered medications prior to visit.     Past Medical History:  Diagnosis Date   Depression 04/06/2018   Diabetes type  1, controlled (HCC) 04/06/2018   HIV (human immunodeficiency virus infection) (HCC) 04/06/2018   Hypertension    Nausea in adult 04/06/2018     Past Surgical History:  Procedure Laterality Date   Bilaterla mesh for Hernia Bilateral 2009   CHOLECYSTECTOMY        Review of Systems  Constitutional:  Negative for appetite change, chills, fatigue, fever and unexpected weight change.  Eyes:  Negative for visual disturbance.  Respiratory:  Negative for cough, chest tightness, shortness of breath and wheezing.   Cardiovascular:  Negative for chest pain and leg swelling.  Gastrointestinal:  Negative for abdominal pain, constipation, diarrhea, nausea and vomiting.  Genitourinary:  Negative for dysuria, flank pain, frequency, genital sores, hematuria and urgency.  Skin:  Negative for rash.  Allergic/Immunologic: Negative for immunocompromised state.  Neurological:  Negative for dizziness and headaches.      Objective:    BP 126/81   Pulse (!) 101   Temp 98 F (36.7 C) (Temporal)   Ht 5\' 7"  (1.702 m)   Wt 172 lb (78 kg)   SpO2 94%   BMI 26.94 kg/m  Nursing note and vital signs reviewed.  Physical Exam Constitutional:      General: He is not in acute distress.    Appearance: He is well-developed.  Eyes:     Conjunctiva/sclera: Conjunctivae normal.  Cardiovascular:     Rate and Rhythm: Normal rate and regular rhythm.     Heart sounds: Normal heart sounds. No murmur heard.    No friction rub. No gallop.  Pulmonary:     Effort: Pulmonary effort is normal. No respiratory distress.     Breath sounds: Normal breath sounds. No wheezing or rales.  Chest:     Chest wall: No tenderness.  Abdominal:     General: Bowel sounds are normal.     Palpations: Abdomen is soft.     Tenderness: There is no abdominal tenderness.  Musculoskeletal:     Cervical back: Neck supple.  Lymphadenopathy:     Cervical: No cervical adenopathy.  Skin:    General: Skin is warm and dry.     Findings:  No rash.  Neurological:     Mental Status: He is alert and oriented to person, place, and time.  Psychiatric:        Behavior: Behavior normal.        Thought Content: Thought content normal.        Judgment: Judgment normal.         11/22/2022    3:36 PM 08/25/2022    3:29 PM 05/13/2022    3:18 PM 12/30/2021    4:11 PM 09/23/2021    4:28 PM  Depression screen PHQ 2/9  Decreased Interest 0 0 0 0 0  Down, Depressed, Hopeless 0 0 0 0 0  PHQ - 2 Score 0 0 0 0 0       Assessment &  Plan:    Patient Active Problem List   Diagnosis Date Noted   Abdominal pain 05/14/2022   At risk for colon cancer 05/13/2022   Flu-like symptoms 02/04/2021   Rash 02/04/2021   Proctitis 02/04/2021   Syphilis 11/18/2020   Early syphilis, latent 06/13/2019   Type 2 diabetes mellitus with hyperglycemia, without long-term current use of insulin (HCC) 06/02/2018   Recurrent major depressive disorder, in full remission (HCC) 06/02/2018   Essential hypertension 06/02/2018   Healthcare maintenance 06/02/2018   Nausea 02/27/2018   Elevated liver enzymes 09/27/2017   Depression 08/30/2017   DOE (dyspnea on exertion) 08/30/2017   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 Continue current dose of hydrochlorothiazide.       Relevant Medications   hydrochlorothiazide (HYDRODIURIL) 25 MG tablet     Endocrine   Type 2 diabetes mellitus with hyperglycemia, without long-term current use of insulin [facility])    Adam Chan has improved hemoglobin A1c down to 11.4 from 12.4 with the addition of insulin and continuation of semaglutide and [place].  Instructions provided to increase Lantus 2 to 3 units every 2 to 3 days as needed for blood sugars greater than  180 in the morning with max dose of 4 units before contacting provider.  Continue current dose of semaglutide and [place].      Relevant Medications   insulin glargine (LANTUS) 100 UNIT/ML Solostar Pen   dapagliflozin propanediol (FARXIGA) 5 MG TABS tablet   Other Relevant Orders   HgB A1c     Other   Human immunodeficiency virus (HIV) disease (HCC) - Primary    Adam Chan continues to have well-controlled virus with good adherence and tolerance to [employer].  Reviewed lab work and discussed U equals U and plan of care.  Continue current dose of Biktarvy.  Plan for follow-up in 3 months or sooner if needed with lab work 1 to 2 weeks prior to appointment.      Relevant Medications   bictegravir-emtricitabine-tenofovir AF (BIKTARVY) 50-200-25 MG TABS tablet   Other Relevant Orders   COMPLETE METABOLIC PANEL WITH GFR   HIV-1 RNA quant-no reflex-bld   T-helper cell (CD4)- ([facility] clinic only)   Recurrent major depressive disorder, in full remission (HCC)   Relevant Medications   escitalopram (LEXAPRO) 20 MG tablet   Healthcare maintenance    Discussed importance of safe sexual practice and condom use. Condoms and STD testing offered.  Vaccinations up to date.         I have discontinued Adam Chan's oxyCODONE. I have also changed his insulin glargine. Additionally, I am having him maintain his Semaglutide (1 MG/DOSE), omeprazole, ondansetron, ondansetron, acetaminophen, TRUEplus Pen Needles, True Metrix Meter, True Metrix Blood Glucose Test, TRUEplus Lancets 28G, amoxicillin, Pitavastatin Magnesium, hydrochlorothiazide, escitalopram, dapagliflozin propanediol, and Biktarvy.   Meds ordered this encounter  Medications   insulin glargine (LANTUS) 100 UNIT/ML Solostar Pen    Sig: Inject 20-40 Units into the skin daily. As instructed per fasting blood sugar    Dispense:  15 mL    Refill:  2    Order Specific Question:   Supervising Provider    Answer:   Drue Second, CYNTHIA [[id number]]    hydrochlorothiazide (HYDRODIURIL) 25 MG tablet  Sig: Take 1 tablet by mouth daily.    Dispense:  30 tablet    Refill:  5    Order Specific Question:   Supervising Provider    Answer:   Drue Second, CYNTHIA [[id number]]   escitalopram (LEXAPRO) 20 MG tablet    Sig: Take 1 tablet by mouth daily.    Dispense:  30 tablet    Refill:  5    Order Specific Question:   Supervising Provider    Answer:   Drue Second, CYNTHIA [[id number]]   dapagliflozin propanediol (FARXIGA) 5 MG TABS tablet    Sig: Take 1 tablet (5 mg total) by mouth daily before breakfast.    Dispense:  30 tablet    Refill:  5    Discontinue Invokana    Order Specific Question:   Supervising Provider    Answer:   Drue Second, CYNTHIA [[id number]]   bictegravir-emtricitabine-tenofovir AF (BIKTARVY) 50-200-25 MG TABS tablet    Sig: Take 1 tablet by mouth daily.    Dispense:  30 tablet    Refill:  5    Order Specific Question:   Supervising Provider    Answer:   Judyann Munson [[id number]]     Follow-up: Return in about 3 months (around 02/22/2023), or if symptoms worsen or fail to improve.   Marcos Eke, MSN, FNP-C Nurse Practitioner [facility] Main number: [phone]

## 2022-11-22 NOTE — Patient Instructions (Addendum)
Nice to see you.  Continue to take your medication daily as prescribed.  Increase Lantus 2-4 unites every 2-3 days for elevated blood sugars above 140-180.   Refills have been sent to the pharmacy.  Plan for follow up in 3 months or sooner if needed with lab work 1-2 weeks prior to appointment.    Have a great day and stay safe!

## 2022-11-22 NOTE — Assessment & Plan Note (Signed)
Discussed importance of safe sexual practice and condom use. Condoms and STD testing offered.  Vaccinations up to date.

## 2022-11-22 NOTE — Assessment & Plan Note (Signed)
Blood pressure well controlled and below goal with no adverse side effects. Continue current dose of hydrochlorothiazide.

## 2022-11-23 ENCOUNTER — Telehealth: Payer: Self-pay

## 2022-11-23 ENCOUNTER — Other Ambulatory Visit (HOSPITAL_COMMUNITY): Payer: Self-pay

## 2022-11-23 NOTE — Telephone Encounter (Signed)
[facility] Patient Advocate Encounter  Completed and sent [employer] PATIENT [employer] application for Ozempic for this patient who is uninsured.    Patient assistance phone number for follow up is [phone].   This encounter will be updated until final determination.   Clearance Coots, CPhT Specialty Pharmacy Patient [facility] Phone: [phone] Fax:  [phone]

## 2022-12-26 ENCOUNTER — Other Ambulatory Visit: Payer: Self-pay | Admitting: Family

## 2023-01-13 ENCOUNTER — Other Ambulatory Visit (HOSPITAL_BASED_OUTPATIENT_CLINIC_OR_DEPARTMENT_OTHER): Payer: Self-pay

## 2023-01-27 ENCOUNTER — Other Ambulatory Visit: Payer: Self-pay | Admitting: Family

## 2023-01-27 DIAGNOSIS — K219 Gastro-esophageal reflux disease without esophagitis: Secondary | ICD-10-CM

## 2023-01-27 DIAGNOSIS — E1165 Type 2 diabetes mellitus with hyperglycemia: Secondary | ICD-10-CM

## 2023-02-17 ENCOUNTER — Other Ambulatory Visit: Payer: Self-pay | Admitting: Family

## 2023-02-21 ENCOUNTER — Other Ambulatory Visit: Payer: 59

## 2023-02-21 ENCOUNTER — Other Ambulatory Visit: Payer: Self-pay

## 2023-02-21 ENCOUNTER — Other Ambulatory Visit: Payer: Self-pay | Admitting: Family

## 2023-02-21 DIAGNOSIS — B20 Human immunodeficiency virus [HIV] disease: Secondary | ICD-10-CM

## 2023-02-21 DIAGNOSIS — E1165 Type 2 diabetes mellitus with hyperglycemia: Secondary | ICD-10-CM

## 2023-02-21 MED ORDER — PITAVASTATIN MAGNESIUM 4 MG PO TABS: 4.0000 mg | ORAL_TABLET | Freq: Every day | ORAL | 6 refills | Status: DC

## 2023-02-21 MED ORDER — DAPAGLIFLOZIN PROPANEDIOL 5 MG PO TABS: 5.0000 mg | ORAL_TABLET | Freq: Every day | ORAL | 5 refills | Status: DC

## 2023-02-22 LAB — T-HELPER CELL (CD4) - (RCID CLINIC ONLY)
CD4 % Helper T Cell: 42 % (ref 33–65)
CD4 T Cell Abs: 900 /uL (ref 400–1790)

## 2023-02-23 LAB — COMPLETE METABOLIC PANEL WITH GFR
AG Ratio: 1.3 (calc) (ref 1.0–2.5)
ALT: 33 U/L (ref 9–46)
AST: 33 U/L (ref 10–35)
Albumin: 4.3 g/dL (ref 3.6–5.1)
Alkaline phosphatase (APISO): 92 U/L (ref 35–144)
BUN: 14 mg/dL (ref 7–25)
CO2: 26 mmol/L (ref 20–32)
Calcium: 9.1 mg/dL (ref 8.6–10.3)
Chloride: 98 mmol/L (ref 98–110)
Creat: 1.14 mg/dL (ref 0.70–1.30)
Globulin: 3.2 g/dL (ref 1.9–3.7)
Glucose, Bld: 361 mg/dL — ABNORMAL HIGH (ref 65–99)
Potassium: 3.9 mmol/L (ref 3.5–5.3)
Sodium: 135 mmol/L (ref 135–146)
Total Bilirubin: 0.6 mg/dL (ref 0.2–1.2)
Total Protein: 7.5 g/dL (ref 6.1–8.1)
eGFR: 76 mL/min/{1.73_m2} (ref >=60)

## 2023-02-23 LAB — HEMOGLOBIN A1C
Hgb A1c MFr Bld: 9.2 %{Hb} — ABNORMAL HIGH (ref <5.7)
Mean Plasma Glucose: 217 mg/dL
eAG (mmol/L): 12 mmol/L

## 2023-02-23 LAB — HIV-1 RNA QUANT-NO REFLEX-BLD
HIV 1 RNA Quant: <20 {copies}/mL — ABNORMAL HIGH
HIV-1 RNA Quant, Log: <1.3 {Log} — ABNORMAL HIGH

## 2023-02-24 ENCOUNTER — Other Ambulatory Visit: Payer: Self-pay | Admitting: Family

## 2023-02-24 DIAGNOSIS — B20 Human immunodeficiency virus [HIV] disease: Secondary | ICD-10-CM

## 2023-02-25 NOTE — Telephone Encounter (Signed)
Okay to refill? 

## 2023-02-28 ENCOUNTER — Other Ambulatory Visit (HOSPITAL_COMMUNITY): Payer: Self-pay

## 2023-03-03 ENCOUNTER — Other Ambulatory Visit: Payer: Self-pay

## 2023-03-04 ENCOUNTER — Telehealth: Payer: Self-pay

## 2023-03-04 NOTE — Telephone Encounter (Signed)
[facility] Patient Advocate Encounter  Completed and sent [employer] Patient Assistance application for Ozempic for this patient who is uninsured.    Patient is approved 02/25/23 through 02/25/24.  Patient ID # [id number]  Medication will be shipped to the office between 10-14 days.   Clearance Coots, CPhT Specialty Pharmacy Patient [facility] Phone: [phone] Fax:  [phone]

## 2023-03-07 ENCOUNTER — Other Ambulatory Visit: Payer: Self-pay

## 2023-03-07 ENCOUNTER — Ambulatory Visit (INDEPENDENT_AMBULATORY_CARE_PROVIDER_SITE_OTHER): Payer: Self-pay | Admitting: Family

## 2023-03-07 ENCOUNTER — Encounter: Payer: Self-pay | Admitting: Family

## 2023-03-07 VITALS — BP 142/88 | HR 92 | Temp 98.3°F | Ht 67.0 in | Wt 179.0 lb

## 2023-03-07 DIAGNOSIS — Z23 Encounter for immunization: Secondary | ICD-10-CM

## 2023-03-07 DIAGNOSIS — A515 Early syphilis, latent: Secondary | ICD-10-CM

## 2023-03-07 DIAGNOSIS — Z0000 Encounter for general adult medical examination without abnormal findings: Secondary | ICD-10-CM

## 2023-03-07 DIAGNOSIS — I10 Essential (primary) hypertension: Secondary | ICD-10-CM

## 2023-03-07 DIAGNOSIS — F330 Major depressive disorder, recurrent, mild: Secondary | ICD-10-CM

## 2023-03-07 DIAGNOSIS — E1165 Type 2 diabetes mellitus with hyperglycemia: Secondary | ICD-10-CM

## 2023-03-07 DIAGNOSIS — B20 Human immunodeficiency virus [HIV] disease: Secondary | ICD-10-CM

## 2023-03-07 DIAGNOSIS — Z9189 Other specified personal risk factors, not elsewhere classified: Secondary | ICD-10-CM

## 2023-03-07 MED ORDER — TRUE METRIX BLOOD GLUCOSE TEST VI STRP
ORAL_STRIP | 12 refills | Status: DC
  Filled 2023-03-07: qty 100, 25d supply, fill #0
  Filled 2023-04-18: qty 100, 100d supply, fill #0

## 2023-03-07 NOTE — Assessment & Plan Note (Signed)
Discussed importance of safe sexual practice and condom use. Condoms and STD testing offered.  Reviewed vaccinations - COVID vaccination updated. Referral placed for colonoscopy.

## 2023-03-07 NOTE — Assessment & Plan Note (Signed)
Hemoglobin A1c improved to 9.2 from 11.4 with current dose of Lantus, semaglutide, and [place].  No hypoglycemia.  Refill test strips.  Continue current dose of Lantus, semaglutide, and [place].  Will work on scheduling diabetic eye exam.

## 2023-03-07 NOTE — Assessment & Plan Note (Signed)
Remind continues to have well-controlled virus with good adherence and tolerance to [employer].  Reviewed lab work and discussed plan of care and U equals U.  Continue current dose of Biktarvy.  Plan for follow-up in 3 months or sooner if needed with lab work 1 to 2 weeks prior to appointment.

## 2023-03-07 NOTE — Assessment & Plan Note (Signed)
Stable with most recent RPR of 1: 32 down from treatment of 1: 512.  Discussed progression of RPR and when treatment would be indicated.  Continue to monitor.

## 2023-03-07 NOTE — Assessment & Plan Note (Signed)
Blood pressure adequately controlled with current dose of hydrochlorothiazide.  No neurological/ophthalmologic signs/symptoms.  Continue to monitor blood pressure at home as able and follow low-sodium diet.  Continue current dose of hydrochlorothiazide.

## 2023-03-07 NOTE — Patient Instructions (Addendum)
Nice to see you.  Continue to take your medication daily as prescribed.  Refills have been sent to the pharmacy.  Plan for follow up in 3 months or sooner if needed with lab work 1-2 weeks prior to appointment.   Have a great day and stay safe!  

## 2023-03-07 NOTE — Assessment & Plan Note (Signed)
Mood stable with current dose of Lexapro.  Denies suicidal ideations and no signs of psychosis.  Continue current dose of Lexapro.

## 2023-03-07 NOTE — Assessment & Plan Note (Signed)
At risk for colon cancer and overdue for colonoscopy.  Referral placed to gastroenterology.

## 2023-03-07 NOTE — Progress Notes (Signed)
Brief Narrative   Patient ID: Adam Chan, male    DOB: [birth year], 52 y.o.   MRN: [id number]  [name] is a 52 y/o Hispanic male diagnosed with HIV disease around September 2018 with risk factor of bisexual contact. Initial CD4 count and viral load are unavailable. Genotype on 10/19/16 with K103N. No history of opportunistic infection. [id number] negative. ART regimen initially Genvoya which caused nausea and changed to [employer].    Subjective:    Chief Complaint  Patient presents with   Follow-up   Diabetes   HIV Positive/AIDS   Depression    HPI:  Adam Chan is a 52 y.o. male with HIV disease l and diabetes ast seen on 11/22/2022 with well-controlled virus and good adherence and tolerance to Biktarvy and poorly controlled diabetes.  HIV viral load was undetectable with CD4 count of 900 and hemoglobin A1c was 11.4.  Most recent lab work completed on 02/21/2023 with viral load that remains undetectable and CD4 count of 900 and hemoglobin A1c of 9.2.  Kidney function, liver function, electrolytes within normal ranges.  Here today for routine follow-up.  Adam Chan has been doing well since his last office visit and continues to take his medications as prescribed with no adverse side effects or problems obtaining medication from the pharmacy.  Has been out of blood glucose strips for approximately 3 weeks now and has noticed his blood sugars coming down as well as he has been feeling better.  Denies excessive hunger, thirst, or urination.  Currently on 50 units total of Lantus divided and switch 25 units twice daily.  No episodes of hypoglycemia.  Does have some feet and hand numbness and tingling on occasion.  Due for diabetic eye exam.  Condoms and STD testing offered.  Vaccinations discussed.  Due for colon cancer screening.  Denies fevers, chills, night sweats, headaches, changes in vision, neck pain/stiffness, nausea, diarrhea, vomiting, lesions or rashes.  Lab Results   Component Value Date   CD4TCELL 42 02/21/2023   CD4TABS 900 02/21/2023   Lab Results  Component Value Date   HIV1RNAQUANT <20 (H) 02/21/2023     Allergies  Allergen Reactions   Elemental Sulfur Other (See Comments)   Sulfa Antibiotics Anaphylaxis      Outpatient Medications Prior to Visit  Medication Sig Dispense Refill   acetaminophen (TYLENOL) 325 MG tablet Take 2 tablets (650 mg total) by mouth every 6 (six) hours as needed. 36 tablet 0   bictegravir-emtricitabine-tenofovir AF (BIKTARVY) 50-200-25 MG TABS tablet Take 1 tablet by mouth daily. 30 tablet 5   Blood Glucose Monitoring Suppl (TRUE METRIX METER) w/Device KIT Use to check blood sugars each morning and as directed. 1 kit 0   dapagliflozin propanediol (FARXIGA) 5 MG TABS tablet Take 1 tablet (5 mg total) by mouth daily before breakfast. 30 tablet 5   escitalopram (LEXAPRO) 20 MG tablet Take 1 tablet by mouth daily. 30 tablet 5   hydrochlorothiazide (HYDRODIURIL) 25 MG tablet Take 1 tablet by mouth daily. 30 tablet 5   insulin glargine (LANTUS) 100 UNIT/ML Solostar Pen Inject 20-40 Units into the skin daily. As instructed per fasting blood sugar 15 mL 2   Insulin Pen Needle (TRUEPLUS 5-BEVEL PEN NEEDLES) 32G X 4 MM MISC USE 1 NEEDLE PER INJECTION OF LANTUS DAILY. DO NOT RE-USE NEEDLES 100 each 1   omeprazole (PRILOSEC) 20 MG capsule TAKE 1 CAPSULE BY MOUTH DAILY 30 capsule 5   ondansetron (ZOFRAN) 8 MG tablet TAKE 1 TABLET(8 MG)  BY MOUTH EVERY 8 HOURS AS NEEDED FOR NAUSEA 20 tablet 5   Pitavastatin Magnesium 4 MG TABS Take 1 tablet (4 mg total) by mouth daily. 30 tablet 6   Semaglutide, 1 MG/DOSE, 4 MG/3ML SOPN Inject 1 mg into the skin once a week. 3 mL 12   TRUEplus Lancets 28G MISC Use as instructed 100 each 12   glucose blood (TRUE METRIX BLOOD GLUCOSE TEST) test strip Use as instructed 100 each 12   amoxicillin (AMOXIL) 875 MG tablet Take 875 mg by mouth 2 (two) times daily. (Patient not taking: Reported on  08/25/2022)     ondansetron (ZOFRAN) 4 MG tablet Take 1 tablet (4 mg total) by mouth every 4 (four) hours as needed for nausea or vomiting. (Patient not taking: Reported on 03/07/2023) 6 tablet 0   No facility-administered medications prior to visit.     Past Medical History:  Diagnosis Date   Depression 04/06/2018   Diabetes type 1, controlled (HCC) 04/06/2018   HIV (human immunodeficiency virus infection) (HCC) 04/06/2018   Hypertension    Nausea in adult 04/06/2018     Past Surgical History:  Procedure Laterality Date   Bilaterla mesh for Hernia Bilateral 2009   CHOLECYSTECTOMY        Review of Systems  Constitutional:  Negative for appetite change, chills, fatigue, fever and unexpected weight change.  Eyes:  Negative for visual disturbance.  Respiratory:  Negative for cough, chest tightness, shortness of breath and wheezing.   Cardiovascular:  Negative for chest pain and leg swelling.  Gastrointestinal:  Negative for abdominal pain, constipation, diarrhea, nausea and vomiting.  Genitourinary:  Negative for dysuria, flank pain, frequency, genital sores, hematuria and urgency.  Skin:  Negative for rash.  Allergic/Immunologic: Negative for immunocompromised state.  Neurological:  Negative for dizziness and headaches.      Objective:    BP (!) 142/88   Pulse 92   Temp 98.3 F (36.8 C) (Temporal)   Ht 5\' 7"  (1.702 m)   Wt 179 lb (81.2 kg)   SpO2 94%   BMI 28.04 kg/m  Nursing note and vital signs reviewed.  Physical Exam Constitutional:      General: He is not in acute distress.    Appearance: He is well-developed.  Eyes:     Conjunctiva/sclera: Conjunctivae normal.  Cardiovascular:     Rate and Rhythm: Normal rate and regular rhythm.     Heart sounds: Normal heart sounds. No murmur heard.    No friction rub. No gallop.  Pulmonary:     Effort: Pulmonary effort is normal. No respiratory distress.     Breath sounds: Normal breath sounds. No wheezing or rales.   Chest:     Chest wall: No tenderness.  Abdominal:     General: Bowel sounds are normal.     Palpations: Abdomen is soft.     Tenderness: There is no abdominal tenderness.  Musculoskeletal:     Cervical back: Neck supple.  Lymphadenopathy:     Cervical: No cervical adenopathy.  Skin:    General: Skin is warm and dry.     Findings: No rash.  Neurological:     Mental Status: He is alert and oriented to person, place, and time.  Psychiatric:        Behavior: Behavior normal.        Thought Content: Thought content normal.        Judgment: Judgment normal.         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   Type 2 diabetes mellitus with hyperglycemia, without long-term current use of insulin (HCC) 06/02/2018   Recurrent major depressive disorder, in full remission (HCC) 06/02/2018   Essential hypertension 06/02/2018   Healthcare maintenance 06/02/2018   Nausea 02/27/2018   Elevated liver enzymes 09/27/2017   Depression 08/30/2017   DOE (dyspnea on exertion) 08/30/2017   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  No neurological/ophthalmologic signs/symptoms.  Continue to monitor blood pressure at home as able and follow low-sodium diet.  Continue  current dose of hydrochlorothiazide.        Endocrine   Type 2 diabetes mellitus with hyperglycemia, without long-term current use of insulin (HCC)    Hemoglobin A1c improved to 9.2 from 11.4 with current dose of Lantus, semaglutide, and [place].  No hypoglycemia.  Refill test strips.  Continue current dose of Lantus, semaglutide, and [place].  Will work on scheduling diabetic eye exam.      Relevant Medications   glucose blood (TRUE METRIX BLOOD GLUCOSE TEST) test strip   Other Relevant Orders   HgB A1c   Urine Microalbumin w/creat. ratio     Other   Human immunodeficiency virus (HIV) disease (HCC) - Primary    Remind continues to have well-controlled virus with good adherence and tolerance to [employer].  Reviewed lab work and discussed plan of care and U equals U.  Continue current dose of Biktarvy.  Plan for follow-up in 3 months or sooner if needed with lab work 1 to 2 weeks prior to appointment.      Relevant Orders   COMPLETE METABOLIC PANEL WITH GFR   HIV-1 RNA quant-no reflex-bld   T-helper cell (CD4)- ([facility] clinic only)   Healthcare maintenance    Discussed importance of safe sexual practice and condom use. Condoms and STD testing offered.  Reviewed vaccinations - COVID vaccination updated. Referral placed for colonoscopy.       Early syphilis, latent    Stable with most recent RPR of 1: 32 down from treatment of 1: 512.  Discussed progression of RPR and when treatment would be indicated.  Continue to monitor.      Relevant Orders   RPR   At risk for colon cancer    At risk for colon cancer and overdue for colonoscopy.  Referral placed to gastroenterology.      Relevant Orders   Ambulatory referral to Gastroenterology   Depression    Mood stable with current dose of Lexapro.  Denies suicidal ideations and no signs of psychosis.  Continue current dose of Lexapro.  Other Visit Diagnoses     Encounter for immunization       Relevant Orders   [employer]  Comirnaty Covid-19 Vaccine 24yrs & older (Completed)        I am having Adam Chan maintain his Semaglutide (1 MG/DOSE), ondansetron, acetaminophen, True Metrix Meter, TRUEplus Lancets 28G, amoxicillin, insulin glargine, hydrochlorothiazide, escitalopram, Biktarvy, TRUEplus 5-Bevel Pen Needles, omeprazole, dapagliflozin propanediol, Pitavastatin Magnesium, ondansetron, and True Metrix Blood Glucose Test.   Meds ordered this encounter  Medications   glucose blood (TRUE METRIX BLOOD GLUCOSE TEST) test strip    Sig: Use as instructed    Dispense:  100 each    Refill:  12    Substitute permissible for appropriate meter strips that are $10 and match selected meter (True Metrix Meter)    Order Specific Question:   Supervising Provider    Answer:   Judyann Munson [[id number]]     Follow-up: Return in about 3 months (around 06/07/2023), or if symptoms worsen or fail to improve. or sooner if needed.    Marcos Eke, MSN, FNP-C Nurse Practitioner [facility] Main number: [phone]

## 2023-03-16 ENCOUNTER — Other Ambulatory Visit: Payer: Self-pay | Admitting: Family

## 2023-03-17 ENCOUNTER — Other Ambulatory Visit: Payer: Self-pay

## 2023-03-24 ENCOUNTER — Other Ambulatory Visit: Payer: Self-pay

## 2023-03-24 ENCOUNTER — Emergency Department (HOSPITAL_BASED_OUTPATIENT_CLINIC_OR_DEPARTMENT_OTHER)
Admission: EM | Admit: 2023-03-24 | Discharge: 2023-03-24 | Disposition: A | Discharge status: Home / Self Care | Payer: Worker's Compensation | Attending: Emergency Medicine | Admitting: Emergency Medicine

## 2023-03-24 ENCOUNTER — Other Ambulatory Visit (HOSPITAL_BASED_OUTPATIENT_CLINIC_OR_DEPARTMENT_OTHER): Payer: Self-pay

## 2023-03-24 ENCOUNTER — Emergency Department (HOSPITAL_BASED_OUTPATIENT_CLINIC_OR_DEPARTMENT_OTHER): Payer: Worker's Compensation

## 2023-03-24 ENCOUNTER — Encounter (HOSPITAL_BASED_OUTPATIENT_CLINIC_OR_DEPARTMENT_OTHER): Payer: Self-pay

## 2023-03-24 DIAGNOSIS — Z79899 Other long term (current) drug therapy: Secondary | ICD-10-CM | POA: Insufficient documentation

## 2023-03-24 DIAGNOSIS — S0101XA Laceration without foreign body of scalp, initial encounter: Secondary | ICD-10-CM | POA: Insufficient documentation

## 2023-03-24 DIAGNOSIS — B20 Human immunodeficiency virus [HIV] disease: Secondary | ICD-10-CM | POA: Insufficient documentation

## 2023-03-24 DIAGNOSIS — S0990XA Unspecified injury of head, initial encounter: Secondary | ICD-10-CM | POA: Diagnosis present

## 2023-03-24 DIAGNOSIS — E119 Type 2 diabetes mellitus without complications: Secondary | ICD-10-CM | POA: Diagnosis not present

## 2023-03-24 DIAGNOSIS — I10 Essential (primary) hypertension: Secondary | ICD-10-CM | POA: Diagnosis not present

## 2023-03-24 DIAGNOSIS — Z794 Long term (current) use of insulin: Secondary | ICD-10-CM | POA: Insufficient documentation

## 2023-03-24 DIAGNOSIS — W01190A Fall on same level from slipping, tripping and stumbling with subsequent striking against furniture, initial encounter: Secondary | ICD-10-CM | POA: Diagnosis not present

## 2023-03-24 MED ORDER — LIDOCAINE-EPINEPHRINE-TETRACAINE (LET) TOPICAL GEL
3.0000 mL | Freq: Once | TOPICAL | Status: AC
  Administered 2023-03-24: 3 mL via TOPICAL
  Filled 2023-03-24: qty 3

## 2023-03-24 MED ORDER — BACITRACIN ZINC 500 UNIT/GM EX OINT
1.0000 | TOPICAL_OINTMENT | Freq: Two times a day (BID) | CUTANEOUS | 0 refills | Status: AC
  Filled 2023-03-24: qty 120, 67d supply, fill #0

## 2023-03-24 MED ORDER — LIDOCAINE-EPINEPHRINE (PF) 2 %-1:200000 IJ SOLN
20.0000 mL | Freq: Once | INTRAMUSCULAR | Status: AC
  Administered 2023-03-24: 20 mL
  Filled 2023-03-24: qty 20

## 2023-03-24 NOTE — Discharge Instructions (Addendum)
Evaluation today was reassuring.  Laceration repair went well.  I placed 7 sutures in the top of your head.  Your head CT was negative.  Sutured repair Keep the laceration site dry for the next 24 hours and leave the dressing in place. After 24 hours you may remove the dressing and gently clean the laceration site with antibacterial soap and warm water. Do not scrub the area. Do not soak the area and water for long periods of time. Don't use hydrogen peroxide, iodine-based solutions, or alcohol, which can slow healing, and will probably be painful! Apply topical bacitracin 1-2 times per day for the next 3-5 days. Return to the emergency department (or PCP)in 5 -7 days for removal of the sutures.  You should return sooner for any signs of infection which would include increased redness around the wound, increased swelling, new drainage of yellow pus.

## 2023-03-24 NOTE — ED Notes (Signed)
ED Provider at bedside. 

## 2023-03-24 NOTE — ED Provider Notes (Signed)
[place] EMERGENCY DEPARTMENT AT [facility] Provider Note   CSN: [id number] Arrival date & time: 03/24/23  4098     History  Chief Complaint  Patient presents with   Head Laceration   HPI Kierre Konigsberg is a 52 y.o. male with history of HIV, type 2 diabetes and hypertension presenting for a fall and head laceration.  Occurred around 930 this morning.  Patient slipped on a waxy floor and fell and hit the back of his head on a metal door frame.  States there was "a good amount of bleeding" initially but it was controlled with gauze.  Patient denies use of blood thinners.  States he has had some dizziness since the fall but that has improved overall.  Denies preceding chest pain, arrhythmia or visual disturbance.  Per his chart, received Tdap last year.   Head Laceration       Home Medications Prior to Admission medications   Medication Sig Start Date End Date Taking? Authorizing Provider  bacitracin ointment Apply 1 Application topically 2 (two) times daily. 03/24/23  Yes Gareth Eagle, PA-C  acetaminophen (TYLENOL) 325 MG tablet Take 2 tablets (650 mg total) by mouth every 6 (six) hours as needed. 06/11/22   Sloan Leiter, DO  bictegravir-emtricitabine-tenofovir AF (BIKTARVY) 50-200-25 MG TABS tablet Take 1 tablet by mouth daily. 11/22/22   Veryl Speak, FNP  Blood Glucose Monitoring Suppl (TRUE METRIX METER) w/Device KIT Use to check blood sugars each morning and as directed. 08/12/22   Veryl Speak, FNP  dapagliflozin propanediol (FARXIGA) 5 MG TABS tablet Take 1 tablet (5 mg total) by mouth daily before breakfast. 02/21/23   Veryl Speak, FNP  escitalopram (LEXAPRO) 20 MG tablet Take 1 tablet by mouth daily. 11/22/22   Veryl Speak, FNP  glucose blood (TRUE METRIX BLOOD GLUCOSE TEST) test strip Use as instructed 03/07/23   Veryl Speak, FNP  hydrochlorothiazide (HYDRODIURIL) 25 MG tablet Take 1 tablet by mouth daily. 11/22/22   Veryl Speak, FNP  insulin glargine (LANTUS SOLOSTAR) 100 UNIT/ML Solostar Pen Inject 25 Units into the skin 2 (two) times daily. 03/16/23   Veryl Speak, FNP  Insulin Pen Needle (TRUEPLUS 5-BEVEL PEN NEEDLES) 32G X 4 MM MISC USE 1 NEEDLE PER INJECTION OF LANTUS DAILY. DO NOT RE-USE NEEDLES 12/28/22   Veryl Speak, FNP  omeprazole (PRILOSEC) 20 MG capsule TAKE 1 CAPSULE BY MOUTH DAILY 01/27/23   Veryl Speak, FNP  ondansetron (ZOFRAN) 4 MG tablet Take 1 tablet (4 mg total) by mouth every 4 (four) hours as needed for nausea or vomiting. Patient not taking: Reported on 03/07/2023 06/11/22   Tanda Rockers A, DO  ondansetron (ZOFRAN) 8 MG tablet TAKE 1 TABLET(8 MG) BY MOUTH EVERY 8 HOURS AS NEEDED FOR NAUSEA 03/01/23   Veryl Speak, FNP  Pitavastatin Magnesium 4 MG TABS Take 1 tablet (4 mg total) by mouth daily. 02/21/23   Veryl Speak, FNP  Semaglutide, 1 MG/DOSE, 4 MG/3ML SOPN Inject 1 mg into the skin once a week. 09/24/21   Veryl Speak, FNP  TRUEplus Lancets 28G MISC Use as instructed 08/12/22   Veryl Speak, FNP      Allergies    Elemental sulfur and Sulfa antibiotics    Review of Systems   See HPI  Physical Exam Updated Vital Signs BP (!) 145/77 (BP Location: Right Arm)   Pulse (!) 102   Resp 18   Ht 5\' 7"  (  1.702 m)   Wt 76.2 kg   SpO2 98%   BMI 26.31 kg/m  Physical Exam Vitals and nursing note reviewed.  HENT:     Head: Normocephalic and atraumatic. No raccoon eyes or Battle's sign.      Comments: No rhinorrhea    Mouth/Throat:     Mouth: Mucous membranes are moist.  Eyes:     General:        Right eye: No discharge.        Left eye: No discharge.     Conjunctiva/sclera: Conjunctivae normal.  Cardiovascular:     Rate and Rhythm: Normal rate and regular rhythm.     Pulses: Normal pulses.     Heart sounds: Normal heart sounds.  Pulmonary:     Effort: Pulmonary effort is normal.     Breath sounds: Normal breath sounds.  Abdominal:     General: Abdomen  is flat.     Palpations: Abdomen is soft.  Skin:    General: Skin is warm and dry.  Neurological:     General: No focal deficit present.     Comments: GCS 15. Speech is goal oriented. No deficits appreciated to CN III-XII; symmetric eyebrow raise, no facial drooping, tongue midline. Patient has equal grip strength bilaterally with 5/5 strength against resistance in all major muscle groups bilaterally. Sensation to light touch intact. Patient moves extremities without ataxia. Normal finger-nose-finger. Patient ambulatory with steady gait.  Psychiatric:        Mood and Affect: Mood normal.     ED Results / Procedures / Treatments   Labs (all labs ordered are listed, but only abnormal results are displayed) Labs Reviewed - No data to display  EKG None  Radiology CT Head Wo Contrast  Result Date: 03/24/2023 CLINICAL DATA:  Polytrauma, blunt. Fall with head strike on metal doorframe. EXAM: CT HEAD WITHOUT CONTRAST TECHNIQUE: Contiguous axial images were obtained from the base of the skull through the vertex without intravenous contrast. RADIATION DOSE REDUCTION: This exam was performed according to the departmental dose-optimization program which includes automated exposure control, adjustment of the mA and/or kV according to patient size and/or use of iterative reconstruction technique. COMPARISON:  Head CT 06/11/2022. FINDINGS: Brain: No acute intracranial hemorrhage. Gray-white differentiation is preserved. No hydrocephalus or extra-axial collection. No mass effect or midline shift. Vascular: No hyperdense vessel or unexpected calcification. Skull: No calvarial fracture or suspicious bone lesion. Skull base is unremarkable. Sinuses/Orbits: No acute finding. Other: Small left parietal scalp laceration near the vertex. IMPRESSION: 1. No evidence of acute intracranial injury. 2. Small left parietal scalp laceration near the vertex. No calvarial fracture. Electronically Signed   By: Orvan Falconer  M.D.   On: 03/24/2023 11:29    Procedures .Marland KitchenLaceration Repair  Date/Time: 03/24/2023 12:23 PM  Performed by: Gareth Eagle, PA-C Authorized by: Gareth Eagle, PA-C   Consent:    Consent obtained:  Verbal   Consent given by:  Patient   Risks discussed:  Infection, retained foreign body and poor cosmetic result   Alternatives discussed:  No treatment Universal protocol:    Procedure explained and questions answered to patient or proxy's satisfaction: yes     Relevant documents present and verified: yes     Patient identity confirmed:  Verbally with patient and arm band Anesthesia:    Anesthesia method:  Topical application and local infiltration   Topical anesthetic:  LET   Local anesthetic:  Lidocaine 2% WITH epi Laceration details:  Location:  Scalp   Scalp location:  Crown   Length (cm):  3   Depth (mm):  5 Pre-procedure details:    Preparation:  Patient was prepped and draped in usual sterile fashion Exploration:    Limited defect created (wound extended): no     Hemostasis achieved with:  LET and direct pressure   Imaging outcome: foreign body not noted     Wound exploration: wound explored through full range of motion     Wound extent: areolar tissue violated   Treatment:    Area cleansed with:  Saline   Amount of cleaning:  Extensive   Irrigation solution:  Sterile saline   Irrigation method:  Pressure wash   Visualized foreign bodies/material removed: no     Debridement:  None   Undermining:  None Skin repair:    Repair method:  Sutures   Suture size:  4-0   Suture material:  Prolene   Suture technique:  Simple interrupted   Number of sutures:  7 Approximation:    Approximation:  Loose Repair type:    Repair type:  Intermediate Post-procedure details:    Dressing:  Open (no dressing)     Medications Ordered in ED Medications  lidocaine-EPINEPHrine-tetracaine (LET) topical gel (3 mLs Topical Given 03/24/23 1118)  lidocaine-EPINEPHrine  (XYLOCAINE W/EPI) 2 %-1:200000 (PF) injection 20 mL (20 mLs Infiltration Given by Other 03/24/23 1126)    ED Course/ Medical Decision Making/ A&P                                 Medical Decision Making Amount and/or Complexity of Data Reviewed Radiology: ordered.  Risk Prescription drug management.   52 year old well-appearing male presenting for mechanical fall and head laceration.  Exam notable for laceration about the posterior scalp but otherwise reassuring without FND. DDx includes skull fracture, intracranial head bleed, concussion, laceration, other.  CT head was negative and patient is without focal neurodeficits making skull fracture or intracranial head bleed unlikely.  Neck repair went well without complication.  Reviewed his chart and patient did receive his tetanus shot last year.  Discussed appropriate wound care at home and advised to follow-up with his PCP or return to the ED for wound reevaluate suture removal in 5 to 7 days. Vital stable.  Discussed return precautions.  Discharged in good condition.         Final Clinical Impression(s) / ED Diagnoses Final diagnoses:  Laceration of scalp, initial encounter    Rx / DC Orders ED Discharge Orders          Ordered    bacitracin ointment  2 times daily        03/24/23 1231              Gareth Eagle, PA-C 03/24/23 1231    Horton, Clabe Seal, DO 03/24/23 1543

## 2023-03-24 NOTE — ED Triage Notes (Signed)
Pt bib by [facility] c/o injuries from slip and fall at work. Pt's head hit a metal doorframe, causing small suturable laceration. Bleeding controlled by gauze upon arrival. Denies LOC, no blood thinners. C/o dizziness since falling. Vitals stable. CAOx4

## 2023-03-27 ENCOUNTER — Emergency Department (HOSPITAL_BASED_OUTPATIENT_CLINIC_OR_DEPARTMENT_OTHER): Payer: Worker's Compensation

## 2023-03-27 ENCOUNTER — Encounter (HOSPITAL_BASED_OUTPATIENT_CLINIC_OR_DEPARTMENT_OTHER): Payer: Self-pay | Admitting: Emergency Medicine

## 2023-03-27 ENCOUNTER — Other Ambulatory Visit: Payer: Self-pay

## 2023-03-27 ENCOUNTER — Emergency Department (HOSPITAL_BASED_OUTPATIENT_CLINIC_OR_DEPARTMENT_OTHER)
Admission: EM | Admit: 2023-03-27 | Discharge: 2023-03-27 | Disposition: A | Discharge status: Home / Self Care | Payer: Worker's Compensation | Attending: Emergency Medicine | Admitting: Emergency Medicine

## 2023-03-27 DIAGNOSIS — Y990 Civilian activity done for income or pay: Secondary | ICD-10-CM | POA: Diagnosis not present

## 2023-03-27 DIAGNOSIS — M542 Cervicalgia: Secondary | ICD-10-CM | POA: Insufficient documentation

## 2023-03-27 DIAGNOSIS — Z794 Long term (current) use of insulin: Secondary | ICD-10-CM | POA: Diagnosis not present

## 2023-03-27 DIAGNOSIS — M546 Pain in thoracic spine: Secondary | ICD-10-CM | POA: Diagnosis not present

## 2023-03-27 DIAGNOSIS — T148XXA Other injury of unspecified body region, initial encounter: Secondary | ICD-10-CM

## 2023-03-27 DIAGNOSIS — S060X0A Concussion without loss of consciousness, initial encounter: Secondary | ICD-10-CM | POA: Insufficient documentation

## 2023-03-27 DIAGNOSIS — M545 Low back pain, unspecified: Secondary | ICD-10-CM | POA: Diagnosis not present

## 2023-03-27 DIAGNOSIS — W0110XA Fall on same level from slipping, tripping and stumbling with subsequent striking against unspecified object, initial encounter: Secondary | ICD-10-CM | POA: Insufficient documentation

## 2023-03-27 DIAGNOSIS — R519 Headache, unspecified: Secondary | ICD-10-CM | POA: Diagnosis present

## 2023-03-27 DIAGNOSIS — R739 Hyperglycemia, unspecified: Secondary | ICD-10-CM | POA: Diagnosis not present

## 2023-03-27 LAB — CBC
HCT: 46 % (ref 39.0–52.0)
Hemoglobin: 15.5 g/dL (ref 13.0–17.0)
MCH: 29.1 pg (ref 26.0–34.0)
MCHC: 33.7 g/dL (ref 30.0–36.0)
MCV: 86.3 fL (ref 80.0–100.0)
Platelets: 191 10*3/uL (ref 150–400)
RBC: 5.33 MIL/uL (ref 4.22–5.81)
RDW: 14.5 % (ref 11.5–15.5)
WBC: 4.9 10*3/uL (ref 4.0–10.5)
nRBC: 0 % (ref 0.0–0.2)

## 2023-03-27 LAB — BASIC METABOLIC PANEL
Anion gap: 7 (ref 5–15)
BUN: 12 mg/dL (ref 6–20)
CO2: 28 mmol/L (ref 22–32)
Calcium: 9.3 mg/dL (ref 8.9–10.3)
Chloride: 99 mmol/L (ref 98–111)
Creatinine, Ser: 0.69 mg/dL (ref 0.61–1.24)
GFR, Estimated: >60 mL/min (ref >60)
Glucose, Bld: 131 mg/dL — ABNORMAL HIGH (ref 70–99)
Potassium: 3.4 mmol/L — ABNORMAL LOW (ref 3.5–5.1)
Sodium: 134 mmol/L — ABNORMAL LOW (ref 135–145)

## 2023-03-27 MED ORDER — DIAZEPAM 2 MG PO TABS: 2.0000 mg | ORAL_TABLET | Freq: Four times a day (QID) | ORAL | 0 refills | Status: DC | PRN

## 2023-03-27 MED ORDER — IOHEXOL 350 MG/ML SOLN
100.0000 mL | Freq: Once | INTRAVENOUS | Status: AC | PRN
  Administered 2023-03-27: 100 mL via INTRAVENOUS

## 2023-03-27 MED ORDER — METOCLOPRAMIDE HCL 5 MG/ML IJ SOLN: 5.0000 mg | Freq: Once | INTRAMUSCULAR | Status: DC

## 2023-03-27 MED ORDER — PROCHLORPERAZINE MALEATE 10 MG PO TABS: 10.0000 mg | ORAL_TABLET | Freq: Two times a day (BID) | ORAL | 0 refills | Status: DC | PRN

## 2023-03-27 MED ORDER — FENTANYL CITRATE PF 50 MCG/ML IJ SOSY
50.0000 ug | PREFILLED_SYRINGE | Freq: Once | INTRAMUSCULAR | Status: AC
  Administered 2023-03-27: 50 ug via INTRAVENOUS
  Filled 2023-03-27: qty 1

## 2023-03-27 MED ORDER — DIPHENHYDRAMINE HCL 50 MG/ML IJ SOLN
12.5000 mg | Freq: Once | INTRAMUSCULAR | Status: AC
  Administered 2023-03-27: 12.5 mg via INTRAVENOUS
  Filled 2023-03-27: qty 1

## 2023-03-27 MED ORDER — ONDANSETRON HCL 4 MG/2ML IJ SOLN
4.0000 mg | Freq: Once | INTRAMUSCULAR | Status: AC
  Administered 2023-03-27: 4 mg via INTRAVENOUS
  Filled 2023-03-27: qty 2

## 2023-03-27 MED ORDER — NAPROXEN 375 MG PO TABS: 375.0000 mg | ORAL_TABLET | Freq: Two times a day (BID) | ORAL | 0 refills | Status: DC

## 2023-03-27 MED ORDER — PROCHLORPERAZINE EDISYLATE 10 MG/2ML IJ SOLN
5.0000 mg | Freq: Once | INTRAMUSCULAR | Status: AC
  Administered 2023-03-27: 5 mg via INTRAVENOUS
  Filled 2023-03-27: qty 2

## 2023-03-27 MED ORDER — KETOROLAC TROMETHAMINE 30 MG/ML IJ SOLN
30.0000 mg | Freq: Once | INTRAMUSCULAR | Status: AC
  Administered 2023-03-27: 30 mg via INTRAVENOUS
  Filled 2023-03-27: qty 1

## 2023-03-27 NOTE — ED Provider Notes (Signed)
[place] EMERGENCY DEPARTMENT AT [facility] Provider Note   CSN: [id number] Arrival date & time: 03/27/23  1458     History {Add pertinent medical, surgical, social history, OB history to HPI:1} Chief Complaint  Patient presents with   Back Pain    Adam Chan is a very pleasant 52 y.o. male who presents emergency department with a chief complaint of severe back pain.  Patient was seen in the emergency department after a significant fall at work 3 days ago.  Patient reports that he slipped on standing water.  Both legs flew up into the air and he landed flat down on his head.  He reports he had his head stitched up and was sent home without imaging but since that time his had progressively worsening and severe back pain.  He feels somewhat sob. S he reports headaches and 1 episode of vomiting.  His headache is described as global, throbbing.  He has had associated light sensitivity, nausea, imbalance, blurry vision, he reports that he f feels like his face is swollen and that other people have told him the same.  He has had flushing of his face.  He has some associated neck pain.  He has not taken anything for pain prior to arrival.   Back Pain      Home Medications Prior to Admission medications   Medication Sig Start Date End Date Taking? Authorizing Provider  acetaminophen (TYLENOL) 325 MG tablet Take 2 tablets (650 mg total) by mouth every 6 (six) hours as needed. 06/11/22   Tanda Rockers A, DO  bacitracin ointment Apply 1 Application topically 2 (two) times daily. 03/24/23   Gareth Eagle, PA-C  bictegravir-emtricitabine-tenofovir AF (BIKTARVY) 50-200-25 MG TABS tablet Take 1 tablet by mouth daily. 11/22/22   Veryl Speak, FNP  Blood Glucose Monitoring Suppl (TRUE METRIX METER) w/Device KIT Use to check blood sugars each morning and as directed. 08/12/22   Veryl Speak, FNP  dapagliflozin propanediol (FARXIGA) 5 MG TABS tablet Take 1 tablet (5 mg total) by  mouth daily before breakfast. 02/21/23   Veryl Speak, FNP  escitalopram (LEXAPRO) 20 MG tablet Take 1 tablet by mouth daily. 11/22/22   Veryl Speak, FNP  glucose blood (TRUE METRIX BLOOD GLUCOSE TEST) test strip Use as instructed 03/07/23   Veryl Speak, FNP  hydrochlorothiazide (HYDRODIURIL) 25 MG tablet Take 1 tablet by mouth daily. 11/22/22   Veryl Speak, FNP  insulin glargine (LANTUS SOLOSTAR) 100 UNIT/ML Solostar Pen Inject 25 Units into the skin 2 (two) times daily. 03/16/23   Veryl Speak, FNP  Insulin Pen Needle (TRUEPLUS 5-BEVEL PEN NEEDLES) 32G X 4 MM MISC USE 1 NEEDLE PER INJECTION OF LANTUS DAILY. DO NOT RE-USE NEEDLES 12/28/22   Veryl Speak, FNP  omeprazole (PRILOSEC) 20 MG capsule TAKE 1 CAPSULE BY MOUTH DAILY 01/27/23   Veryl Speak, FNP  ondansetron (ZOFRAN) 4 MG tablet Take 1 tablet (4 mg total) by mouth every 4 (four) hours as needed for nausea or vomiting. Patient not taking: Reported on 03/07/2023 06/11/22   Tanda Rockers A, DO  ondansetron (ZOFRAN) 8 MG tablet TAKE 1 TABLET(8 MG) BY MOUTH EVERY 8 HOURS AS NEEDED FOR NAUSEA 03/01/23   Veryl Speak, FNP  Pitavastatin Magnesium 4 MG TABS Take 1 tablet (4 mg total) by mouth daily. 02/21/23   Veryl Speak, FNP  Semaglutide, 1 MG/DOSE, 4 MG/3ML SOPN Inject 1 mg into the skin once a week. 09/24/21  Veryl Speak, FNP  TRUEplus Lancets 28G MISC Use as instructed 08/12/22   Veryl Speak, FNP      Allergies    Elemental sulfur and Sulfa antibiotics    Review of Systems   Review of Systems  Musculoskeletal:  Positive for back pain.    Physical Exam Updated Vital Signs BP (!) 136/97 (BP Location: Right Arm)   Pulse 92   Temp 99 F (37.2 C)   Resp 16   SpO2 95%  Physical Exam Vitals and nursing note reviewed.  Constitutional:      General: He is not in acute distress.    Appearance: He is well-developed. He is not diaphoretic.  HENT:     Head: Normocephalic and atraumatic.      Comments: Well-healing laceration of the scalp.  No signs of infection Face is flushed and red. Venous distention noted to the temporal region    Nose: Nose normal.     Mouth/Throat:     Mouth: Mucous membranes are moist.  Eyes:     General: No scleral icterus.    Conjunctiva/sclera: Conjunctivae normal.     Pupils: Pupils are equal, round, and reactive to light.     Comments: No horizontal, vertical or rotational nystagmus  Neck:     Comments: Stiffness and tenderness in the bilateral paraspinal muscles. Cardiovascular:     Rate and Rhythm: Normal rate and regular rhythm.     Heart sounds: Normal heart sounds.  Pulmonary:     Effort: Pulmonary effort is normal. No respiratory distress.     Breath sounds: Normal breath sounds. No wheezing or rales.  Abdominal:     General: There is no distension.     Palpations: Abdomen is soft.     Tenderness: There is no abdominal tenderness. There is no guarding or rebound.  Musculoskeletal:        General: Normal range of motion.     Cervical back: Normal range of motion and neck supple.     Comments: Midline tenderness in the mid thoracic region.  Sharp pain with palpation of the posterolateral left mid thoracic rib cage.  Patient has tender, spastic bilateral lumbar paraspinal muscle tenderness.  Range of motion is limited.  Normal strength in the lower extremities.  Lymphadenopathy:     Cervical: No cervical adenopathy.  Skin:    General: Skin is warm and dry.     Findings: No rash.  Neurological:     Mental Status: He is alert and oriented to person, place, and time.     Cranial Nerves: No cranial nerve deficit.     Motor: No abnormal muscle tone.     Coordination: Coordination normal.     Comments: Mental Status:  Alert, oriented, thought content appropriate. Speech fluent without evidence of aphasia. Able to follow 2 step commands without difficulty.  Cranial Nerves:  II:  Peripheral visual fields grossly normal, pupils equal,  round, reactive to light III,IV, VI: ptosis not present, extra-ocular motions intact bilaterally  V,VII: smile symmetric, facial light touch sensation equal VIII: hearing grossly normal bilaterally  IX,X: midline uvula rise  XI: bilateral shoulder shrug equal and strong XII: midline tongue extension  Motor:  5/5 in upper and lower extremities bilaterally including strong and equal grip strength and dorsiflexion/plantar flexion Cerebellar: normal finger-to-nose with bilateral upper extremities Gait: normal gait mild imbalance when turning  CV: distal pulses palpable throughout   Psychiatric:        Behavior: Behavior normal.  Thought Content: Thought content normal.        Judgment: Judgment normal.     ED Results / Procedures / Treatments   Labs (all labs ordered are listed, but only abnormal results are displayed) Labs Reviewed  CBC  BASIC METABOLIC PANEL    EKG None  Radiology No results found.  Procedures Procedures  {Document cardiac monitor, telemetry assessment procedure when appropriate:1}  Medications Ordered in ED Medications  fentaNYL (SUBLIMAZE) injection 50 mcg (has no administration in time range)  ondansetron (ZOFRAN) injection 4 mg (has no administration in time range)    ED Course/ Medical Decision Making/ A&P Clinical Course as of 03/27/23 1909  Wynelle Link Mar 27, 2023  1849 CT CHEST ABDOMEN PELVIS W CONTRAST [AH]  1849 CT Cervical Spine Wo Contrast I visualized interpreted CT chest abdomen pelvis and CT C-spine.  There are no acute findings. [AH]    Clinical Course User Index [AH] Arthor Captain, PA-C   {   Click here for ABCD2, HEART and other calculatorsREFRESH Note before signing :1}                              Medical Decision Making Amount and/or Complexity of Data Reviewed Labs: ordered. Radiology: ordered. Decision-making details documented in ED Course.  Risk Prescription drug management.   ***  {Document critical care time  when appropriate:1} {Document review of labs and clinical decision tools ie heart score, Chads2Vasc2 etc:1}  {Document your independent review of radiology images, and any outside records:1} {Document your discussion with family members, caretakers, and with consultants:1} {Document social determinants of health affecting pt's care:1} {Document your decision making why or why not admission, treatments were needed:1} Final Clinical Impression(s) / ED Diagnoses Final diagnoses:  None    Rx / DC Orders ED Discharge Orders     None

## 2023-03-27 NOTE — ED Triage Notes (Signed)
Pt was here on Thursday, he had lac on head sutured. But now his back has started hurting, as he landed onhis back before he hit his head.

## 2023-03-27 NOTE — ED Notes (Signed)
ED Provider at bedside. 

## 2023-03-27 NOTE — Discharge Instructions (Addendum)
Contact a health care provider if: Your symptoms do not improve or get worse. You have new symptoms. You have another injury. Your coordination gets worse. You have unusual behavior changes. Get help right away if: You have a severe or worsening headache. You have weakness or numbness in any part of your body, slurred speech, vision changes, or confusion. You vomit repeatedly. You lose consciousness, are sleepier than normal, or are difficult to wake up. You have a seizure. These symptoms may be an emergency. Get help right away. Call 911.

## 2023-04-18 ENCOUNTER — Other Ambulatory Visit: Payer: Self-pay

## 2023-04-21 ENCOUNTER — Other Ambulatory Visit: Payer: Self-pay | Admitting: Family

## 2023-04-21 ENCOUNTER — Other Ambulatory Visit: Payer: Self-pay

## 2023-04-28 ENCOUNTER — Other Ambulatory Visit: Payer: Self-pay | Admitting: Family

## 2023-04-28 DIAGNOSIS — E1165 Type 2 diabetes mellitus with hyperglycemia: Secondary | ICD-10-CM

## 2023-05-12 MED ORDER — AMOXICILLIN-POT CLAVULANATE 875-125 MG PO TABS
1.0000 | ORAL_TABLET | Freq: Two times a day (BID) | ORAL | 0 refills | Status: DC
Start: 2023-05-12 — End: 2023-07-07

## 2023-05-24 ENCOUNTER — Other Ambulatory Visit: Payer: Self-pay

## 2023-05-24 ENCOUNTER — Other Ambulatory Visit: Payer: Self-pay | Admitting: Family

## 2023-05-24 DIAGNOSIS — E1165 Type 2 diabetes mellitus with hyperglycemia: Secondary | ICD-10-CM

## 2023-05-24 DIAGNOSIS — B20 Human immunodeficiency virus [HIV] disease: Secondary | ICD-10-CM

## 2023-05-24 DIAGNOSIS — A515 Early syphilis, latent: Secondary | ICD-10-CM

## 2023-05-25 LAB — MICROALBUMIN / CREATININE URINE RATIO
Creatinine, Urine: 58 mg/dL (ref 20–320)
Microalb Creat Ratio: 12 mg/g{creat} (ref <30)
Microalb, Ur: 0.7 mg/dL

## 2023-05-25 MED ORDER — SITAGLIPTIN PHOSPHATE 100 MG PO TABS: 100.0000 mg | ORAL_TABLET | Freq: Every day | ORAL | 5 refills | Status: DC

## 2023-05-28 LAB — COMPLETE METABOLIC PANEL WITH GFR
AG Ratio: 1.6 (calc) (ref 1.0–2.5)
ALT: 62 U/L — ABNORMAL HIGH (ref 9–46)
AST: 51 U/L — ABNORMAL HIGH (ref 10–35)
Albumin: 4.4 g/dL (ref 3.6–5.1)
Alkaline phosphatase (APISO): 96 U/L (ref 35–144)
BUN: 18 mg/dL (ref 7–25)
CO2: 28 mmol/L (ref 20–32)
Calcium: 8.9 mg/dL (ref 8.6–10.3)
Chloride: 94 mmol/L — ABNORMAL LOW (ref 98–110)
Creat: 1.14 mg/dL (ref 0.70–1.30)
Globulin: 2.8 g/dL (ref 1.9–3.7)
Glucose, Bld: 379 mg/dL — ABNORMAL HIGH (ref 65–99)
Potassium: 4 mmol/L (ref 3.5–5.3)
Sodium: 131 mmol/L — ABNORMAL LOW (ref 135–146)
Total Bilirubin: 0.6 mg/dL (ref 0.2–1.2)
Total Protein: 7.2 g/dL (ref 6.1–8.1)
eGFR: 76 mL/min/{1.73_m2} (ref >=60)

## 2023-05-28 LAB — T PALLIDUM AB: T Pallidum Abs: POSITIVE — AB

## 2023-05-28 LAB — HEMOGLOBIN A1C
Hgb A1c MFr Bld: 11.1 %{Hb} — ABNORMAL HIGH (ref <5.7)
Mean Plasma Glucose: 272 mg/dL
eAG (mmol/L): 15.1 mmol/L

## 2023-05-28 LAB — HIV-1 RNA QUANT-NO REFLEX-BLD
HIV 1 RNA Quant: 27 {copies}/mL — ABNORMAL HIGH
HIV-1 RNA Quant, Log: 1.43 {Log} — ABNORMAL HIGH

## 2023-05-28 LAB — T-HELPER CELLS (CD4) COUNT (NOT AT ARMC)
Absolute CD4: 1137 {cells}/uL (ref 490–1740)
CD4 T Helper %: 46 % (ref 30–61)
Total lymphocyte count: 2460 {cells}/uL (ref 850–3900)

## 2023-05-28 LAB — RPR: RPR Ser Ql: REACTIVE — AB

## 2023-05-28 LAB — RPR TITER: RPR Titer: 1:64 {titer} — ABNORMAL HIGH

## 2023-05-30 ENCOUNTER — Other Ambulatory Visit: Payer: Self-pay | Admitting: Family

## 2023-05-30 DIAGNOSIS — B20 Human immunodeficiency virus [HIV] disease: Secondary | ICD-10-CM

## 2023-06-07 ENCOUNTER — Ambulatory Visit: Payer: Self-pay | Admitting: Family

## 2023-06-16 ENCOUNTER — Telehealth: Payer: Self-pay

## 2023-06-16 NOTE — Telephone Encounter (Signed)
 [facility] Patient Advocate Encounter  Patient's medications OZEMPIC  4MG /3ML  have been couriered to [facility] from [facility] and will be picked-up at [facility].  4 BOXES = 4 MONTHS   Charmaine Sharps, CPhT Specialty Pharmacy Patient [facility] Phone: [phone] Fax:  [phone]

## 2023-06-22 ENCOUNTER — Other Ambulatory Visit: Payer: Self-pay | Admitting: Family

## 2023-07-07 ENCOUNTER — Other Ambulatory Visit: Payer: Self-pay

## 2023-07-07 ENCOUNTER — Ambulatory Visit (INDEPENDENT_AMBULATORY_CARE_PROVIDER_SITE_OTHER): Payer: Self-pay | Admitting: Family

## 2023-07-07 ENCOUNTER — Encounter: Payer: Self-pay | Admitting: Family

## 2023-07-07 VITALS — BP 116/75 | HR 105 | Temp 97.8°F | Wt 188.0 lb

## 2023-07-07 DIAGNOSIS — R110 Nausea: Secondary | ICD-10-CM

## 2023-07-07 DIAGNOSIS — Z7984 Long term (current) use of oral hypoglycemic drugs: Secondary | ICD-10-CM

## 2023-07-07 DIAGNOSIS — Z794 Long term (current) use of insulin: Secondary | ICD-10-CM

## 2023-07-07 DIAGNOSIS — I10 Essential (primary) hypertension: Secondary | ICD-10-CM

## 2023-07-07 DIAGNOSIS — F3342 Major depressive disorder, recurrent, in full remission: Secondary | ICD-10-CM

## 2023-07-07 DIAGNOSIS — B20 Human immunodeficiency virus [HIV] disease: Secondary | ICD-10-CM

## 2023-07-07 DIAGNOSIS — A539 Syphilis, unspecified: Secondary | ICD-10-CM

## 2023-07-07 DIAGNOSIS — E1165 Type 2 diabetes mellitus with hyperglycemia: Secondary | ICD-10-CM

## 2023-07-07 DIAGNOSIS — K219 Gastro-esophageal reflux disease without esophagitis: Secondary | ICD-10-CM

## 2023-07-07 DIAGNOSIS — Z0000 Encounter for general adult medical examination without abnormal findings: Secondary | ICD-10-CM

## 2023-07-07 DIAGNOSIS — R748 Abnormal levels of other serum enzymes: Secondary | ICD-10-CM

## 2023-07-07 MED ORDER — LANTUS SOLOSTAR 100 UNIT/ML ~~LOC~~ SOPN: 30.0000 [IU] | PEN_INJECTOR | Freq: Two times a day (BID) | SUBCUTANEOUS | 1 refills | Status: DC

## 2023-07-07 MED ORDER — ESCITALOPRAM OXALATE 20 MG PO TABS: ORAL_TABLET | ORAL | 5 refills | Status: DC

## 2023-07-07 MED ORDER — ONDANSETRON HCL 8 MG PO TABS
ORAL_TABLET | ORAL | 5 refills | Status: DC
Start: 2023-07-07 — End: 2023-09-29

## 2023-07-07 MED ORDER — OMEPRAZOLE 20 MG PO CPDR: DELAYED_RELEASE_CAPSULE | ORAL | 5 refills | Status: DC

## 2023-07-07 MED ORDER — HYDROCHLOROTHIAZIDE 25 MG PO TABS: ORAL_TABLET | ORAL | 5 refills | Status: DC

## 2023-07-07 MED ORDER — DAPAGLIFLOZIN PROPANEDIOL 10 MG PO TABS
10.0000 mg | ORAL_TABLET | Freq: Every day | ORAL | 5 refills | Status: DC
Start: 2023-07-07 — End: 2023-09-29

## 2023-07-07 MED ORDER — PITAVASTATIN MAGNESIUM 4 MG PO TABS: 4.0000 mg | ORAL_TABLET | Freq: Every day | ORAL | 6 refills | Status: DC

## 2023-07-07 NOTE — Patient Instructions (Addendum)
 Nice to see you.  Continue to take your medication daily as prescribed.  Refills have been sent to the pharmacy.  Plan for follow up in 3 months or sooner if needed with lab work 1-2 weeks prior to appointment.   Have a great day and stay safe!

## 2023-07-07 NOTE — Progress Notes (Unsigned)
 Brief Narrative   Patient ID: Adam Chan, male    DOB: 02-23-1971, 53 y.o.   MRN: [id number]  [name] is a 53 y/o Hispanic male diagnosed with HIV disease around September 2018 with risk factor of bisexual contact. Initial CD4 count and viral load are unavailable. Genotype on 10/19/16 with K103N. No history of opportunistic infection. [id number] negative. ART regimen initially Genvoya which caused nausea and changed to [employer].    Subjective:    Chief Complaint  Patient presents with   HIV Positive/AIDS   Diabetes   Hypertension    HPI:  Adam Chan is a 54 y.o. male with HIV disease, hypertension, and diabetes last seen on 03/07/2023 with well-controlled virus and less than optimally controlled diabetes.  Viral load was undetectable with CD4 count 900.  Hemoglobin A1c was 9.2.  Most recent lab work completed on 05/24/2023 with viral load that remains undetectable and CD4 count 1137.  RPR titer increased to 1: 64 although likely baseline 1: 32 from previous treatment of 1: 512.  Hemoglobin A1c of 11.1.  Blood glucose was 379 which was random and liver chemistries were elevated with AST of 51 and ALT of 62.  Here today for routine follow-up.  Adam Chan has been doing okay since his last office visit and continues to take medications as prescribed with no adverse side effects or problems obtaining medication from the pharmacy.  He did prior to his most recent blood work stopped taking 2 of his diabetes medications which he now knows led to his increase in hemoglobin A1c.  Has since restarted taking medications as prescribed.  Blood sugars in the morning are in the middle 100s.  Does not currently check blood sugars around mealtimes.  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  Condoms and site-specific STD testing offered.  Healthcare maintenance reviewed.  Denies fevers, chills, night sweats, headaches, changes in vision, neck pain/stiffness, nausea, diarrhea,  vomiting, lesions or rashes.  Lab Results  Component Value Date   CD4TCELL 46 05/24/2023   CD4TABS 900 02/21/2023   Lab Results  Component Value Date   HIV1RNAQUANT 27 (H) 05/24/2023     Allergies  Allergen Reactions   Elemental Sulfur Other (See Comments)   Sulfa Antibiotics Anaphylaxis      Outpatient Medications Prior to Visit  Medication Sig Dispense Refill   acetaminophen (TYLENOL) 325 MG tablet Take 2 tablets (650 mg total) by mouth every 6 (six) hours as needed. 36 tablet 0   bacitracin ointment Apply 1 Application topically 2 (two) times daily. 28 g 0   BIKTARVY 50-200-25 MG TABS tablet TAKE 1 TABLET BY MOUTH DAILY 30 tablet 5   Blood Glucose Monitoring Suppl (TRUE METRIX METER) w/Device KIT Use to check blood sugars each morning and as directed. 1 kit 0   diazepam (VALIUM) 2 MG tablet Take 1 tablet (2 mg total) by mouth every 6 (six) hours as needed for muscle spasms. 20 tablet 0   glucose blood (TRUE METRIX BLOOD GLUCOSE TEST) test strip Use as instructed 100 each 12   naproxen (NAPROSYN) 375 MG tablet Take 1 tablet (375 mg total) by mouth 2 (two) times daily with a meal. 14 tablet 0   prochlorperazine (COMPAZINE) 10 MG tablet Take 1 tablet (10 mg total) by mouth 2 (two) times daily as needed for nausea or vomiting. 10 tablet 0   Semaglutide, 1 MG/DOSE, 4 MG/3ML SOPN Inject 1 mg into the skin once a week. 3 mL 12  sitaGLIPtin (JANUVIA) 100 MG tablet Take 1 tablet (100 mg total) by mouth daily. 30 tablet 5   TRUEPLUS 5-BEVEL PEN NEEDLES 32G X 4 MM MISC USE 1 NEEDLE PER INJECTION OF LANTUS DAILY. DO NOT RE-USE NEEDLES 100 each 1   TRUEplus Lancets 28G MISC Use as instructed 100 each 12   dapagliflozin propanediol (FARXIGA) 5 MG TABS tablet Take 1 tablet (5 mg total) by mouth daily before breakfast. 30 tablet 5   escitalopram (LEXAPRO) 20 MG tablet Take 1 tablet by mouth daily. 30 tablet 5   hydrochlorothiazide (HYDRODIURIL) 25 MG tablet Take 1 tablet by mouth daily. 30  tablet 5   LANTUS SOLOSTAR 100 UNIT/ML Solostar Pen ADMINISTER 25 UNITS UNDER THE SKIN TWICE DAILY 15 mL 2   omeprazole (PRILOSEC) 20 MG capsule TAKE 1 CAPSULE BY MOUTH DAILY 30 capsule 5   ondansetron (ZOFRAN) 8 MG tablet TAKE 1 TABLET(8 MG) BY MOUTH EVERY 8 HOURS AS NEEDED FOR NAUSEA 20 tablet 5   Pitavastatin Magnesium 4 MG TABS Take 1 tablet (4 mg total) by mouth daily. 30 tablet 6   amoxicillin-clavulanate (AUGMENTIN) 875-125 MG tablet Take 1 tablet by mouth 2 (two) times daily. (Patient not taking: Reported on 07/07/2023) 14 tablet 0   ondansetron (ZOFRAN) 4 MG tablet Take 1 tablet (4 mg total) by mouth every 4 (four) hours as needed for nausea or vomiting. (Patient not taking: Reported on 07/07/2023) 6 tablet 0   No facility-administered medications prior to visit.     Past Medical History:  Diagnosis Date   Depression 04/06/2018   Diabetes type 1, controlled (HCC) 04/06/2018   HIV (human immunodeficiency virus infection) (HCC) 04/06/2018   Hypertension    Nausea in adult 04/06/2018   Proctitis 02/04/2021     Past Surgical History:  Procedure Laterality Date   Bilaterla mesh for Hernia Bilateral 2009   CHOLECYSTECTOMY        Review of Systems  Constitutional:  Negative for appetite change, chills, fatigue, fever and unexpected weight change.  Eyes:  Negative for visual disturbance.  Respiratory:  Negative for cough, chest tightness, shortness of breath and wheezing.   Cardiovascular:  Negative for chest pain and leg swelling.  Gastrointestinal:  Negative for abdominal pain, constipation, diarrhea, nausea and vomiting.  Genitourinary:  Negative for dysuria, flank pain, frequency, genital sores, hematuria and urgency.  Skin:  Negative for rash.  Allergic/Immunologic: Negative for immunocompromised state.  Neurological:  Positive for numbness. Negative for dizziness and headaches.      Objective:    BP 116/75   Pulse (!) 105   Temp 97.8 F (36.6 C) (Oral)   Wt 188  lb (85.3 kg)   SpO2 93%   BMI 29.44 kg/m  Nursing note and vital signs reviewed.  Physical Exam Constitutional:      General: He is not in acute distress.    Appearance: He is well-developed.  Eyes:     Conjunctiva/sclera: Conjunctivae normal.  Cardiovascular:     Rate and Rhythm: Normal rate and regular rhythm.     Heart sounds: Normal heart sounds. No murmur heard.    No friction rub. No gallop.  Pulmonary:     Effort: Pulmonary effort is normal. No respiratory distress.     Breath sounds: Normal breath sounds. No wheezing or rales.  Chest:     Chest wall: No tenderness.  Abdominal:     General: Bowel sounds are normal.     Palpations: Abdomen is soft.  Tenderness: There is no abdominal tenderness.  Musculoskeletal:     Cervical back: Neck supple.  Lymphadenopathy:     Cervical: No cervical adenopathy.  Skin:    General: Skin is warm and dry.     Findings: No rash.  Neurological:     Mental Status: He is alert and oriented to person, place, and time.  Psychiatric:        Behavior: Behavior normal.        Thought Content: Thought content normal.        Judgment: Judgment normal.         03/07/2023    3:15 PM 11/22/2022    3:36 PM 08/25/2022    3:29 PM 05/13/2022    3:18 PM 12/30/2021    4:11 PM  Depression screen PHQ 2/9  Decreased Interest 0 0 0 0 0  Down, Depressed, Hopeless 0 0 0 0 0  PHQ - 2 Score 0 0 0 0 0       Assessment & Plan:    Patient Active Problem List   Diagnosis Date Noted   Abdominal pain 05/14/2022   At risk for colon cancer 05/13/2022   Syphilis 11/18/2020   Early syphilis, latent 06/13/2019   Type 2 diabetes mellitus with hyperglycemia, without long-term current use of insulin (HCC) 06/02/2018   Recurrent major depressive disorder, in full remission (HCC) 06/02/2018   Essential hypertension 06/02/2018   Healthcare maintenance 06/02/2018   Nausea 02/27/2018   Elevated liver enzymes 09/27/2017   Depression 08/30/2017   DOE  (dyspnea on exertion) 08/30/2017   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  No adverse side effects or hypotensive readings.  Does not currently check blood pressure at home.  Continue following low-sodium/carb modified diet.  Continue current dose of hydrochlorothiazide.      Relevant Medications   hydrochlorothiazide (HYDRODIURIL) 25 MG tablet   Pitavastatin Magnesium 4 MG TABS     Endocrine   Type 2 diabetes mellitus with hyperglycemia, without long-term current use of insulin [facility])   Mr. Cowher has elevated hemoglobin A1c of 11.1 as well as blood sugars secondary to stopping medications recently to see what would happen with his blood sugars and has since restarted.  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  Continue current dose of Januvia and semaglutide.  Increase Farxiga to 10 mg daily.  Continue 30 units Lantus twice daily.  Encouraged daily foot monitoring and will need diabetic eye exam.  Plan for follow-up in 3 months or sooner if needed.      Relevant Medications   Pitavastatin Magnesium 4 MG TABS   insulin glargine (LANTUS SOLOSTAR) 100 UNIT/ML Solostar Pen   dapagliflozin propanediol (FARXIGA) 10 MG TABS tablet     Other   Human immunodeficiency virus (HIV) disease (HCC) - Primary   Mr. Beezley continues to have well-controlled virus with good adherence and tolerance to [employer].  Reviewed previous lab work and discussed plan of care and U equals U.  Covered by [facility].  Social determinants of health reviewed with no interventions indicated.  Continue current dose of  Biktarvy.  Plan for follow-up in  3 months or sooner if needed with lab work 1 to 2 weeks prior to appointment.      Relevant Medications   ondansetron (ZOFRAN) 8 MG tablet   Recurrent major depressive disorder, in full remission (HCC)   Mood is stable with current dose of Lexapro and as needed diazepam for anxiety.  No suicidal ideations or signs of psychosis.  Continue current dose of Lexapro and as needed diazepam.      Relevant Medications   escitalopram (LEXAPRO) 20 MG tablet   Healthcare maintenance   Discussed importance of safe sexual practice and condom use. Condoms and site specific STD testing offered.  Vaccinations reviewed and recommend Shingrix. Due for colon cancer screening through colonoscopy or Cologuard.  Will work to get this covered as he is currently uninsured. Due for routine dental care and awaiting appointment. Due for anal Pap smear and deferred until next office visit.      Syphilis   Syphilis titer increased to 1: 64 although I believe his baseline is truly 1: 32 following previous treatment at 1: 512.  No treatment is indicated at this time and would treat at 1: 128 at this point.  No current signs of infection and not currently sexually active.  Continue to monitor RPR.      Elevated liver enzymes   Mr. Hellberg has elevated liver enzymes up from previously normal although has a history of elevated liver enzymes.  Hepatitis panel has been negative previously with previous ultrasound consistent with fatty infiltration.  If remains elevated at next check will consider additional ultrasound to determine any progression.  Encouraged to follow a modified carbohydrate diet.      Nausea   Continues to have chronic nausea that is adequately controlled with as needed ondansetron.  Suspect this is likely related to his poorly controlled diabetes.  He is on semaglutide however symptoms started prior to semaglutide and do not appear to be worsening since starting  medication.  Cannot fully exclude diabetic gastroparesis.  Continue current dose of ondansetron as needed.      Other Visit Diagnoses       Gastroesophageal reflux disease without esophagitis       Relevant Medications   omeprazole (PRILOSEC) 20 MG capsule   ondansetron (ZOFRAN) 8 MG tablet        I have discontinued Antoine Taft's dapagliflozin propanediol and amoxicillin-clavulanate. I have also changed his Lantus SoloStar. Additionally, I am having him start on dapagliflozin propanediol. Lastly, I am having him maintain his Semaglutide (1 MG/DOSE), acetaminophen, True Metrix Meter, TRUEplus Lancets 28G, True Metrix Blood Glucose Test, bacitracin, naproxen, prochlorperazine, diazepam, TRUEplus 5-Bevel Pen Needles, sitaGLIPtin, Biktarvy, escitalopram, hydrochlorothiazide, omeprazole, Pitavastatin Magnesium, and ondansetron.   Meds ordered this encounter  Medications   escitalopram (LEXAPRO) 20 MG tablet    Sig: Take 1 tablet by mouth daily.    Dispense:  30 tablet    Refill:  5    Supervising Provider:   Judyann Munson [[id number]]   hydrochlorothiazide (HYDRODIURIL) 25 MG tablet    Sig: Take 1 tablet by mouth daily.    Dispense:  30 tablet    Refill:  5    Supervising Provider:   Drue Second, CYNTHIA [[id number]]   omeprazole (PRILOSEC) 20 MG capsule    Sig: TAKE 1 CAPSULE BY MOUTH DAILY    Dispense:  30 capsule    Refill:  5    Supervising Provider:   Judyann Munson [[id number]]   Pitavastatin Magnesium 4 MG TABS  Sig: Take 1 tablet (4 mg total) by mouth daily.    Dispense:  30 tablet    Refill:  6    Supervising Provider:   Drue Second, CYNTHIA [[id number]]   ondansetron (ZOFRAN) 8 MG tablet    Sig: TAKE 1 TABLET(8 MG) BY MOUTH EVERY 8 HOURS AS NEEDED FOR NAUSEA    Dispense:  20 tablet    Refill:  5    Supervising Provider:   Judyann Munson [[id number]]   insulin glargine (LANTUS SOLOSTAR) 100 UNIT/ML Solostar Pen    Sig: Inject 30 Units into the skin 2 (two) times daily.    Dispense:  18 mL     Refill:  1    Supervising Provider:   Judyann Munson [[id number]]   dapagliflozin propanediol (FARXIGA) 10 MG TABS tablet    Sig: Take 1 tablet (10 mg total) by mouth daily before breakfast.    Dispense:  30 tablet    Refill:  5    Supervising Provider:   Judyann Munson [[id number]]     Follow-up: Return in about 3 months (around 10/04/2023). or sooner if needed.    Marcos Eke, MSN, FNP-C Nurse Practitioner [facility] for Infectious Disease [facility] Main number: [phone]

## 2023-07-08 ENCOUNTER — Encounter: Payer: Self-pay | Admitting: Family

## 2023-07-08 NOTE — Assessment & Plan Note (Addendum)
 Discussed importance of safe sexual practice and condom use. Condoms and site specific STD testing offered.  Vaccinations reviewed and recommend Shingrix. Due for colon cancer screening through colonoscopy or Cologuard.  Will work to get this covered as he is currently uninsured. Due for routine dental care and awaiting appointment. Due for anal Pap smear and deferred until next office visit.

## 2023-07-08 NOTE — Assessment & Plan Note (Signed)
 Mr. Amsden has elevated hemoglobin A1c of 11.1 as well as blood sugars secondary to stopping medications recently to see what would happen with his blood sugars and has since restarted.  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  Continue current dose of Januvia and semaglutide.  Increase Farxiga to 10 mg daily.  Continue 30 units Lantus twice daily.  Encouraged daily foot monitoring and will need diabetic eye exam.  Plan for follow-up in 3 months or sooner if needed.

## 2023-07-08 NOTE — Assessment & Plan Note (Signed)
 Mr. Bodi continues to have well-controlled virus with good adherence and tolerance to [employer].  Reviewed previous lab work and discussed plan of care and U equals U.  Covered by [facility].  Social determinants of health reviewed with no interventions indicated.  Continue current dose of Biktarvy.  Plan for follow-up in 3 months or sooner if needed with lab work 1 to 2 weeks prior to appointment.

## 2023-07-08 NOTE — Assessment & Plan Note (Signed)
 Adam Chan has elevated liver enzymes up from previously normal although has a history of elevated liver enzymes.  Hepatitis panel has been negative previously with previous ultrasound consistent with fatty infiltration.  If remains elevated at next check will consider additional ultrasound to determine any progression.  Encouraged to follow a modified carbohydrate diet.

## 2023-07-08 NOTE — Assessment & Plan Note (Signed)
 Continues to have chronic nausea that is adequately controlled with as needed ondansetron.  Suspect this is likely related to his poorly controlled diabetes.  He is on semaglutide however symptoms started prior to semaglutide and do not appear to be worsening since starting medication.  Cannot fully exclude diabetic gastroparesis.  Continue current dose of ondansetron as needed.

## 2023-07-08 NOTE — Assessment & Plan Note (Signed)
 Mood is stable with current dose of Lexapro and as needed diazepam for anxiety.  No suicidal ideations or signs of psychosis.  Continue current dose of Lexapro and as needed diazepam.

## 2023-07-08 NOTE — Assessment & Plan Note (Signed)
 Blood pressure adequately controlled with current dose of hydrochlorothiazide and below goal of 130/80.  No adverse side effects or hypotensive readings.  Does not currently check blood pressure at home.  Continue following low-sodium/carb modified diet.  Continue current dose of hydrochlorothiazide.

## 2023-07-08 NOTE — Assessment & Plan Note (Signed)
 Syphilis titer increased to 1: 64 although I believe his baseline is truly 1: 32 following previous treatment at 1: 512.  No treatment is indicated at this time and would treat at 1: 128 at this point.  No current signs of infection and not currently sexually active.  Continue to monitor RPR.

## 2023-07-19 NOTE — Telephone Encounter (Signed)
 Called patient to further assess symptoms, no answer. Left HIPAA compliant voicemail requesting callback.   Sandie Ano, RN

## 2023-08-02 ENCOUNTER — Other Ambulatory Visit: Payer: Self-pay | Admitting: Family

## 2023-09-07 ENCOUNTER — Other Ambulatory Visit: Payer: Self-pay

## 2023-09-07 DIAGNOSIS — E1165 Type 2 diabetes mellitus with hyperglycemia: Secondary | ICD-10-CM

## 2023-09-07 DIAGNOSIS — B20 Human immunodeficiency virus [HIV] disease: Secondary | ICD-10-CM

## 2023-09-14 ENCOUNTER — Ambulatory Visit: Payer: Self-pay

## 2023-09-14 ENCOUNTER — Other Ambulatory Visit: Payer: Self-pay

## 2023-09-14 DIAGNOSIS — B20 Human immunodeficiency virus [HIV] disease: Secondary | ICD-10-CM

## 2023-09-14 DIAGNOSIS — E1165 Type 2 diabetes mellitus with hyperglycemia: Secondary | ICD-10-CM

## 2023-09-15 ENCOUNTER — Other Ambulatory Visit: Payer: Self-pay

## 2023-09-15 LAB — T-HELPER CELL (CD4) - (RCID CLINIC ONLY)
CD4 % Helper T Cell: 44 % (ref 33–65)
CD4 T Cell Abs: 883 /uL (ref 400–1790)

## 2023-09-16 LAB — CBC WITH DIFFERENTIAL/PLATELET
Absolute Lymphocytes: 2229 {cells}/uL (ref 850–3900)
Absolute Monocytes: 410 {cells}/uL (ref 200–950)
Basophils Absolute: 23 {cells}/uL (ref 0–200)
Basophils Relative: 0.4 %
Eosinophils Absolute: 68 {cells}/uL (ref 15–500)
Eosinophils Relative: 1.2 %
HCT: 49.2 % (ref 38.5–50.0)
Hemoglobin: 15.8 g/dL (ref 13.2–17.1)
MCH: 27 pg (ref 27.0–33.0)
MCHC: 32.1 g/dL (ref 32.0–36.0)
MCV: 84 fL (ref 80.0–100.0)
MPV: 9.4 fL (ref 7.5–12.5)
Monocytes Relative: 7.2 %
Neutro Abs: 2970 {cells}/uL (ref 1500–7800)
Neutrophils Relative %: 52.1 %
Platelets: 232 10*3/uL (ref 140–400)
RBC: 5.86 10*6/uL — ABNORMAL HIGH (ref 4.20–5.80)
RDW: 15 % (ref 11.0–15.0)
Total Lymphocyte: 39.1 %
WBC: 5.7 10*3/uL (ref 3.8–10.8)

## 2023-09-16 LAB — COMPLETE METABOLIC PANEL WITHOUT GFR
AG Ratio: 1.2 (calc) (ref 1.0–2.5)
ALT: 34 U/L (ref 9–46)
AST: 31 U/L (ref 10–35)
Albumin: 4.4 g/dL (ref 3.6–5.1)
Alkaline phosphatase (APISO): 90 U/L (ref 35–144)
BUN: 22 mg/dL (ref 7–25)
CO2: 26 mmol/L (ref 20–32)
Calcium: 9.5 mg/dL (ref 8.6–10.3)
Chloride: 97 mmol/L — ABNORMAL LOW (ref 98–110)
Creat: 0.94 mg/dL (ref 0.70–1.30)
Globulin: 3.6 g/dL (ref 1.9–3.7)
Glucose, Bld: 332 mg/dL — ABNORMAL HIGH (ref 65–99)
Potassium: 3.7 mmol/L (ref 3.5–5.3)
Sodium: 133 mmol/L — ABNORMAL LOW (ref 135–146)
Total Bilirubin: 0.4 mg/dL (ref 0.2–1.2)
Total Protein: 8 g/dL (ref 6.1–8.1)

## 2023-09-16 LAB — HIV-1 RNA QUANT-NO REFLEX-BLD
HIV 1 RNA Quant: <20 {copies}/mL — AB
HIV-1 RNA Quant, Log: <1.3 {Log_copies}/mL — AB

## 2023-09-16 LAB — HEMOGLOBIN A1C
Hgb A1c MFr Bld: 10.2 % — ABNORMAL HIGH (ref <5.7)
Mean Plasma Glucose: 246 mg/dL
eAG (mmol/L): 13.6 mmol/L

## 2023-09-20 ENCOUNTER — Other Ambulatory Visit: Payer: Self-pay | Admitting: Family

## 2023-09-20 DIAGNOSIS — B20 Human immunodeficiency virus [HIV] disease: Secondary | ICD-10-CM

## 2023-09-23 ENCOUNTER — Telehealth: Payer: Self-pay

## 2023-09-23 NOTE — Telephone Encounter (Signed)
 [facility] Encounter  Patient's medications OZEMPIC  have been couriered to [facility] from [facility] and will be picked up on 09/26/22.  Ozempic  1mg  4 boxes Lot # [id number] EXP 01/07/26  Roylene Corn, CPhT [facility]ty [facility] Patient Adv[facility] Phone: [phone] Fax:  [phone]

## 2023-09-29 ENCOUNTER — Other Ambulatory Visit: Payer: Self-pay

## 2023-09-29 ENCOUNTER — Encounter: Payer: Self-pay | Admitting: Family

## 2023-09-29 ENCOUNTER — Ambulatory Visit (INDEPENDENT_AMBULATORY_CARE_PROVIDER_SITE_OTHER): Payer: Self-pay | Admitting: Family

## 2023-09-29 ENCOUNTER — Other Ambulatory Visit (HOSPITAL_COMMUNITY): Payer: Self-pay

## 2023-09-29 VITALS — BP 136/92 | HR 99 | Temp 98.6°F | Ht 67.0 in | Wt 187.0 lb

## 2023-09-29 DIAGNOSIS — N521 Erectile dysfunction due to diseases classified elsewhere: Secondary | ICD-10-CM

## 2023-09-29 DIAGNOSIS — Z0000 Encounter for general adult medical examination without abnormal findings: Secondary | ICD-10-CM

## 2023-09-29 DIAGNOSIS — N529 Male erectile dysfunction, unspecified: Secondary | ICD-10-CM

## 2023-09-29 DIAGNOSIS — I10 Essential (primary) hypertension: Secondary | ICD-10-CM

## 2023-09-29 DIAGNOSIS — B20 Human immunodeficiency virus [HIV] disease: Secondary | ICD-10-CM

## 2023-09-29 DIAGNOSIS — E1165 Type 2 diabetes mellitus with hyperglycemia: Secondary | ICD-10-CM

## 2023-09-29 DIAGNOSIS — F3342 Major depressive disorder, recurrent, in full remission: Secondary | ICD-10-CM

## 2023-09-29 DIAGNOSIS — K219 Gastro-esophageal reflux disease without esophagitis: Secondary | ICD-10-CM

## 2023-09-29 DIAGNOSIS — Z794 Long term (current) use of insulin: Secondary | ICD-10-CM

## 2023-09-29 MED ORDER — DAPAGLIFLOZIN PROPANEDIOL 10 MG PO TABS
10.0000 mg | ORAL_TABLET | Freq: Every day | ORAL | 5 refills | Status: DC
  Filled 2023-09-29: qty 30, 30d supply, fill #0

## 2023-09-29 MED ORDER — TADALAFIL 10 MG PO TABS: 10.0000 mg | ORAL_TABLET | Freq: Every day | ORAL | 0 refills | Status: DC | PRN

## 2023-09-29 MED ORDER — OMEPRAZOLE 20 MG PO CPDR
20.0000 mg | DELAYED_RELEASE_CAPSULE | Freq: Every day | ORAL | 5 refills | Status: DC
  Filled 2023-09-29: qty 30, fill #0
  Filled 2023-09-30: qty 30, 30d supply, fill #0

## 2023-09-29 MED ORDER — BIKTARVY 50-200-25 MG PO TABS: 1.0000 | ORAL_TABLET | Freq: Every day | ORAL | 5 refills | Status: DC

## 2023-09-29 MED ORDER — TRUEPLUS 5-BEVEL PEN NEEDLES 32G X 4 MM MISC
1 refills | Status: DC
  Filled 2023-09-29: qty 100, fill #0

## 2023-09-29 MED ORDER — PITAVASTATIN MAGNESIUM 4 MG PO TABS
4.0000 mg | ORAL_TABLET | Freq: Every day | ORAL | 6 refills | Status: DC
  Filled 2023-09-29: qty 90, 90d supply, fill #0

## 2023-09-29 MED ORDER — LANTUS SOLOSTAR 100 UNIT/ML ~~LOC~~ SOPN
30.0000 [IU] | PEN_INJECTOR | Freq: Two times a day (BID) | SUBCUTANEOUS | 1 refills | Status: DC
  Filled 2023-09-29: qty 18, 30d supply, fill #0

## 2023-09-29 MED ORDER — ONDANSETRON HCL 8 MG PO TABS
8.0000 mg | ORAL_TABLET | Freq: Three times a day (TID) | ORAL | 5 refills | Status: DC | PRN
  Filled 2023-09-29: qty 20, fill #0
  Filled 2023-09-30: qty 20, 7d supply, fill #0

## 2023-09-29 MED ORDER — HYDROCHLOROTHIAZIDE 25 MG PO TABS
25.0000 mg | ORAL_TABLET | Freq: Every day | ORAL | 5 refills | Status: DC
  Filled 2023-09-29 – 2023-09-30 (×2): qty 30, 30d supply, fill #0

## 2023-09-29 MED ORDER — ESCITALOPRAM OXALATE 20 MG PO TABS
20.0000 mg | ORAL_TABLET | Freq: Every day | ORAL | 5 refills | Status: DC
  Filled 2023-09-29 – 2023-09-30 (×2): qty 30, 30d supply, fill #0

## 2023-09-29 MED ORDER — TRUE METRIX BLOOD GLUCOSE TEST VI STRP
ORAL_STRIP | 12 refills | Status: DC
  Filled 2023-09-29: qty 100, fill #0
  Filled 2023-09-30: qty 100, 90d supply, fill #0

## 2023-09-29 MED ORDER — TRUEPLUS LANCETS 28G MISC
12 refills | Status: DC
  Filled 2023-09-29: qty 100, fill #0
  Filled 2023-09-30: qty 100, 90d supply, fill #0

## 2023-09-29 MED ORDER — SITAGLIPTIN PHOSPHATE 100 MG PO TABS
100.0000 mg | ORAL_TABLET | Freq: Every day | ORAL | 5 refills | Status: DC
  Filled 2023-09-29: qty 30, 30d supply, fill #0

## 2023-09-29 NOTE — Progress Notes (Signed)
 Brief Narrative   Patient ID: Adam Chan, male    DOB: 12-25-1970, 53 y.o.   MRN: [id number]  [name] is a 53 y/o Hispanic male diagnosed with HIV disease around September 2018 with risk factor of bisexual contact. Initial CD4 count and viral load are unavailable. Genotype on 10/19/16 with K103N. No history of opportunistic infection. [id number] negative. ART regimen initially Genvoya which caused nausea and changed to Biktarvy .    Subjective:   Chief Complaint  Patient presents with   Follow-up    HPI:  Adam Chan is a 53 y.o. male with HIV disease, hypertension, and diabetes last seen on 07/07/2023 with well-controlled virus and good adherence and tolerance to Biktarvy .  Type 2 diabetes was poorly controlled with hemoglobin A1c of 11.1.  Kidney function and electrolytes within normal ranges.  Liver function testing elevated with AST 51 and ALT 62.  Most recent lab work completed on 09/14/2023 with viral load that remains undetectable and CD4 count of 883.  Kidney function and electrolytes remain normal.  Liver function has returned to normal.  Hemoglobin A1c now down to 10.2%.  Here today for routine follow-up.  [name] has been doing okay since his last office visit and continues to take medications as prescribed with no adverse side effects or problems obtaining medication from the pharmacy.  Has noticed his blood sugars in the morning/fasting blood sugars have been decreasing down to 125-145 on average.  Does not currently take postprandial blood sugars.  Has no problems obtaining medication from the pharmacy and is covered by [employer].  Has concerns for erectile dysfunction and inability to obtain erection that has been going on for a significant period of time and is interested in trying medication to assist.  He has also started taking Adam Chan under tea to help with his blood sugars.  Condoms and site-specific STD testing offered.  Healthcare maintenance reviewed.  Housing,  access to food, and transportation are stable.  Continues to work in the OR.  Denies fevers, chills, night sweats, headaches, changes in vision, neck pain/stiffness, nausea, diarrhea, vomiting, lesions or rashes.  Lab Results  Component Value Date   CD4TCELL 44 09/14/2023   CD4TABS 883 09/14/2023   Lab Results  Component Value Date   HIV1RNAQUANT <20 DETECTED (A) 09/14/2023     Allergies  Allergen Reactions   Elemental Sulfur Other (See Comments)   Sulfa Antibiotics Anaphylaxis      Outpatient Medications Prior to Visit  Medication Sig Dispense Refill   acetaminophen  (TYLENOL ) 325 MG tablet Take 2 tablets (650 mg total) by mouth every 6 (six) hours as needed. 36 tablet 0   bacitracin  ointment Apply 1 Application topically 2 (two) times daily. 28 g 0   Blood Glucose Monitoring Suppl (TRUE METRIX METER) w/Device KIT Use to check blood sugars each morning and as directed. 1 kit 0   diazepam  (VALIUM ) 2 MG tablet Take 1 tablet (2 mg total) by mouth every 6 (six) hours as needed for muscle spasms. 20 tablet 0   naproxen  (NAPROSYN ) 375 MG tablet Take 1 tablet (375 mg total) by mouth 2 (two) times daily with a meal. 14 tablet 0   prochlorperazine  (COMPAZINE ) 10 MG tablet Take 1 tablet (10 mg total) by mouth 2 (two) times daily as needed for nausea or vomiting. 10 tablet 0   Semaglutide , 1 MG/DOSE, 4 MG/3ML SOPN Inject 1 mg into the skin once a week. 3 mL 12   BIKTARVY  50-200-25 MG TABS tablet TAKE  1 TABLET BY MOUTH DAILY 30 tablet 5   dapagliflozin  propanediol (FARXIGA ) 10 MG TABS tablet Take 1 tablet (10 mg total) by mouth daily before breakfast. 30 tablet 5   escitalopram  (LEXAPRO ) 20 MG tablet Take 1 tablet by mouth daily. 30 tablet 5   glucose blood (TRUE METRIX BLOOD GLUCOSE TEST) test strip Use as instructed 100 each 12   hydrochlorothiazide  (HYDRODIURIL ) 25 MG tablet Take 1 tablet by mouth daily. 30 tablet 5   insulin  glargine (LANTUS  SOLOSTAR) 100 UNIT/ML Solostar Pen Inject 30  Units into the skin 2 (two) times daily. 18 mL 1   omeprazole  (PRILOSEC) 20 MG capsule TAKE 1 CAPSULE BY MOUTH DAILY 30 capsule 5   ondansetron  (ZOFRAN ) 8 MG tablet TAKE 1 TABLET(8 MG) BY MOUTH EVERY 8 HOURS AS NEEDED FOR NAUSEA 20 tablet 5   Pitavastatin  Magnesium  4 MG TABS Take 1 tablet (4 mg total) by mouth daily. 30 tablet 6   sitaGLIPtin  (JANUVIA ) 100 MG tablet Take 1 tablet (100 mg total) by mouth daily. 30 tablet 5   TRUEPLUS 5-BEVEL PEN NEEDLES 32G X 4 MM MISC USE 1 NEEDLE PER INJECTION OF LANTUS  DAILY. DO NOT RE-USE NEEDLES 100 each 1   TRUEplus Lancets 28G MISC Use as instructed 100 each 12   No facility-administered medications prior to visit.     Past Medical History:  Diagnosis Date   Depression 04/06/2018   Diabetes type 1, controlled (HCC) 04/06/2018   HIV (human immunodeficiency virus infection) (HCC) 04/06/2018   Hypertension    Nausea in adult 04/06/2018   Proctitis 02/04/2021     Past Surgical History:  Procedure Laterality Date   Bilaterla mesh for Hernia Bilateral 2009   CHOLECYSTECTOMY          Review of Systems  Constitutional:  Negative for appetite change, chills, fatigue, fever and unexpected weight change.  Eyes:  Negative for visual disturbance.  Respiratory:  Negative for cough, chest tightness, shortness of breath and wheezing.   Cardiovascular:  Negative for chest pain and leg swelling.  Gastrointestinal:  Negative for abdominal pain, constipation, diarrhea, nausea and vomiting.  Genitourinary:  Negative for dysuria, flank pain, frequency, genital sores, hematuria and urgency.  Skin:  Negative for rash.  Allergic/Immunologic: Negative for immunocompromised state.  Neurological:  Negative for dizziness and headaches.     Objective:   BP (!) 136/92   Pulse 99   Temp 98.6 F (37 C) (Oral)   Ht 5\' 7"  (1.702 m)   Wt 187 lb (84.8 kg)   SpO2 92%   BMI 29.29 kg/m  Nursing note and vital signs reviewed.  Physical Exam Constitutional:       General: He is not in acute distress.    Appearance: He is well-developed.  Eyes:     Conjunctiva/sclera: Conjunctivae normal.  Cardiovascular:     Rate and Rhythm: Normal rate and regular rhythm.     Heart sounds: Normal heart sounds. No murmur heard.    No friction rub. No gallop.  Pulmonary:     Effort: Pulmonary effort is normal. No respiratory distress.     Breath sounds: Normal breath sounds. No wheezing or rales.  Chest:     Chest wall: No tenderness.  Abdominal:     General: Bowel sounds are normal.     Palpations: Abdomen is soft.     Tenderness: There is no abdominal tenderness.  Musculoskeletal:     Cervical back: Neck supple.  Lymphadenopathy:     Cervical: No  cervical adenopathy.  Skin:    General: Skin is warm and dry.     Findings: No rash.  Neurological:     Mental Status: He is alert and oriented to person, place, and time.  Psychiatric:        Behavior: Behavior normal.        Thought Content: Thought content normal.        Judgment: Judgment normal.          09/29/2023    4:31 PM 03/07/2023    3:15 PM 11/22/2022    3:36 PM 08/25/2022    3:29 PM 05/13/2022    3:18 PM  Depression screen PHQ 2/9  Decreased Interest 1 0 0 0 0  Down, Depressed, Hopeless 0 0 0 0 0  PHQ - 2 Score 1 0 0 0 0  Altered sleeping 0      Tired, decreased energy 2      Change in appetite 0      Feeling bad or failure about yourself  0      Trouble concentrating 0      Moving slowly or fidgety/restless 0      Suicidal thoughts 0      PHQ-9 Score 3      Difficult doing work/chores Not difficult at all            09/29/2023    4:32 PM  GAD 7 : Generalized Anxiety Score  Nervous, Anxious, on Edge 1  Control/stop worrying 2  Worry too much - different things 2  Trouble relaxing 1  Restless 0  Easily annoyed or irritable 0  Afraid - awful might happen 0  Total GAD 7 Score 6     The 10-year ASCVD risk score (Arnett DK, et al., 2019) is: 8.2%   Values used to  calculate the score:     Age: 28 years     Sex: Male     Is Non-Hispanic African American: No     Diabetic: Yes     Tobacco smoker: No     Systolic Blood Pressure: 136 mmHg     Is BP treated: Yes     HDL Cholesterol: 66 mg/dL     Total Cholesterol: 157 mg/dL      Assessment & Plan:    Patient Active Problem List   Diagnosis Date Noted   Abdominal pain 05/14/2022   At risk for colon cancer 05/13/2022   Syphilis 11/18/2020   Early syphilis, latent 06/13/2019   Type 2 diabetes mellitus with hyperglycemia, without long-term current use of insulin  (HCC) 06/02/2018   Recurrent major depressive disorder, in full remission (HCC) 06/02/2018   Essential hypertension 06/02/2018   Healthcare maintenance 06/02/2018   Nausea 02/27/2018   Elevated liver enzymes 09/27/2017   Depression 08/30/2017   DOE (dyspnea on exertion) 08/30/2017   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  Encouraged to continue to monitor blood pressure at home and follow low-sodium/DASH diet.  Continue current dose of hydrochlorothiazide .      Relevant Medications   tadalafil (CIALIS) 10 MG tablet   hydrochlorothiazide  (HYDRODIURIL ) 25 MG tablet   Pitavastatin  Magnesium  4 MG TABS     Endocrine   Type 2 diabetes mellitus with hyperglycemia,  without long-term current use of insulin  (HCC)   Diabetes remains poorly controlled with A1c of 10.2% although appears fasting blood sugars are improving. Encouraged to check preprandial blood sugars for any elevation as may need short acting insulin . Continue current dose of semaglutide , lantus , and dapagliflozin . Recommended to establish with Endocrinology.       Relevant Medications   glucose blood (TRUE METRIX BLOOD  GLUCOSE TEST) test strip   TRUEplus Lancets 28G MISC   dapagliflozin  propanediol (FARXIGA ) 10 MG TABS tablet   insulin  glargine (LANTUS  SOLOSTAR) 100 UNIT/ML Solostar Pen   Pitavastatin  Magnesium  4 MG TABS   sitaGLIPtin  (JANUVIA ) 100 MG tablet     Other   Human immunodeficiency virus (HIV) disease (HCC)   [name] continues to have well-controlled virus with good adherence and tolerance to Biktarvy .  Reviewed lab work and discussed plan of care and U equals U.  Social determinants of health reviewed with no interventions indicated.  Covered by [employer] and will need to be renewed prior to the end of the year.  Continue current dose of Biktarvy .  Plan for follow-up in 3 months or sooner if needed with lab work 1 to 2 weeks prior to appointment.      Relevant Medications   ondansetron  (ZOFRAN ) 8 MG tablet   bictegravir-emtricitabine-tenofovir AF (BIKTARVY ) 50-200-25 MG TABS tablet   Recurrent major depressive disorder, in full remission (HCC)   Mood has remained stable on Lexapro  with no adverse side effects.  No suicidal ideations or signs of psychosis.  Continue current dose of Lexapro .      Relevant Medications   escitalopram  (LEXAPRO ) 20 MG tablet   Healthcare maintenance   Discussed importance of safe sexual practice and condom use. Condoms and site specific STD testing offered.  Vaccinations reviewed  Awaiting dental care appointment.  Due for colon cancer screening and counseled on colonoscopy.  Due for anal cancer screening and will consider at next visit.       Erectile dysfunction of organic origin   This is a chronic problem having been seen in Urology previously and less than optimal effects from sildenafil. Suspect it is multifactorial given multiple medical conditions and medications contributing. Will try tadalafil. Counseled on adverse risks of medication. May need follow up with Urology if symptoms persist.      Other Visit Diagnoses       Erectile dysfunction due  to diseases classified elsewhere    -  Primary   Relevant Medications   tadalafil (CIALIS) 10 MG tablet     Gastroesophageal reflux disease without esophagitis       Relevant Medications   ondansetron  (ZOFRAN ) 8 MG tablet   omeprazole  (PRILOSEC) 20 MG capsule        I have changed Adam Chan's ondansetron  and TRUEplus 5-Bevel Pen Needles. I am also having him start on tadalafil. Additionally, I am having him maintain his Semaglutide  (1 MG/DOSE), acetaminophen , True Metrix Meter, bacitracin , naproxen , prochlorperazine , diazepam , True Metrix Blood Glucose Test, TRUEplus Lancets 28G, Biktarvy , dapagliflozin  propanediol, escitalopram , hydrochlorothiazide , Lantus  SoloStar, omeprazole , Pitavastatin  Magnesium , and sitaGLIPtin .   Meds ordered this encounter  Medications   DISCONTD: bictegravir-emtricitabine-tenofovir AF (BIKTARVY ) 50-200-25 MG TABS tablet    Sig: Take 1 tablet by mouth daily.    Dispense:  30 tablet    Refill:  5    Supervising Provider:   SNIDER, CYNTHIA [phone]    Prescription Type::   Renewal    Change Pharmacy::   [facility] [phone] @ [place] [facility] -  [place] - [address] [[id number]]   DISCONTD: dapagliflozin  propanediol (FARXIGA ) 10 MG TABS tablet    Sig: Take 1 tablet (10 mg total) by mouth daily before breakfast.    Dispense:  30 tablet    Refill:  5    Supervising Provider:   SNIDER, CYNTHIA [phone]   DISCONTD: escitalopram  (LEXAPRO ) 20 MG tablet    Sig: Take 1 tablet (20 mg total) by mouth daily.    Dispense:  30 tablet    Refill:  5    Supervising Provider:   Liane Redman [[id number]]   glucose blood (TRUE METRIX BLOOD GLUCOSE TEST) test strip    Sig: Use as instructed    Dispense:  100 each    Refill:  12    Substitute permissible for appropriate meter strips that are $10 and match selected meter (True Metrix Meter)    Supervising Provider:   Liane Redman [phone]   DISCONTD: hydrochlorothiazide  (HYDRODIURIL ) 25 MG tablet    Sig: Take  1 tablet (25 mg total) by mouth daily.    Dispense:  30 tablet    Refill:  5    Supervising Provider:   SNIDER, CYNTHIA [phone]   DISCONTD: insulin  glargine (LANTUS  SOLOSTAR) 100 UNIT/ML Solostar Pen    Sig: Inject 30 Units into the skin 2 (two) times daily.    Dispense:  18 mL    Refill:  1    Supervising Provider:   SNIDER, CYNTHIA [phone]   DISCONTD: omeprazole  (PRILOSEC) 20 MG capsule    Sig: Take 1 capsule (20 mg total) by mouth daily.    Dispense:  30 capsule    Refill:  5    Supervising Provider:   SNIDER, CYNTHIA [[id number]]   ondansetron  (ZOFRAN ) 8 MG tablet    Sig: Take 1 tablet (8 mg total) by mouth every 8 (eight) hours as needed for nausea.    Dispense:  20 tablet    Refill:  5    Supervising Provider:   SNIDER, CYNTHIA [phone]   DISCONTD: Pitavastatin  Magnesium  4 MG TABS    Sig: Take 1 tablet (4 mg total) by mouth daily.    Dispense:  30 tablet    Refill:  6    Supervising Provider:   SNIDER, CYNTHIA [phone]   DISCONTD: sitaGLIPtin  (JANUVIA ) 100 MG tablet    Sig: Take 1 tablet (100 mg total) by mouth daily.    Dispense:  30 tablet    Refill:  5    Supervising Provider:   SNIDER, CYNTHIA [[id number]]   Insulin  Pen Needle (TRUEPLUS 5-BEVEL PEN NEEDLES) 32G X 4 MM MISC    Sig: USE 1 NEEDLE PER INJECTION OF LANTUS  DAILY. DO NOT RE-USE NEEDLES    Dispense:  100 each    Refill:  1    Supervising Provider:   Levern Reader, CYNTHIA [[id number]]   TRUEplus Lancets 28G MISC    Sig: Use as instructed    Dispense:  100 each    Refill:  12    Substitution permissible for lancets that are $2    Supervising Provider:   SNIDER, CYNTHIA [[id number]]   tadalafil (CIALIS) 10 MG tablet    Sig: Take 1 tablet (10 mg total) by mouth daily as needed for erectile dysfunction.    Dispense:  30 tablet    Refill:  0    Has goodrx coupon    Supervising Provider:   SNIDER, CYNTHIA [[id number]]   DISCONTD: dapagliflozin  propanediol (FARXIGA ) 10 MG TABS tablet  Sig: Take 1 tablet (10 mg total) by mouth daily before  breakfast.    Dispense:  30 tablet    Refill:  5    Supervising Provider:   SNIDER, CYNTHIA [[id number]]   bictegravir-emtricitabine-tenofovir AF (BIKTARVY ) 50-200-25 MG TABS tablet    Sig: Take 1 tablet by mouth daily.    Dispense:  30 tablet    Refill:  5    Supervising Provider:   SNIDER, CYNTHIA [phone]    Prescription Type::   Renewal   dapagliflozin  propanediol (FARXIGA ) 10 MG TABS tablet    Sig: Take 1 tablet (10 mg total) by mouth daily before breakfast.    Dispense:  30 tablet    Refill:  5    Supervising Provider:   SNIDER, CYNTHIA [[id number]]   escitalopram  (LEXAPRO ) 20 MG tablet    Sig: Take 1 tablet (20 mg total) by mouth daily.    Dispense:  30 tablet    Refill:  5    Supervising Provider:   SNIDER, CYNTHIA [[id number]]   hydrochlorothiazide  (HYDRODIURIL ) 25 MG tablet    Sig: Take 1 tablet (25 mg total) by mouth daily.    Dispense:  30 tablet    Refill:  5    Supervising Provider:   SNIDER, CYNTHIA [[id number]]   insulin  glargine (LANTUS  SOLOSTAR) 100 UNIT/ML Solostar Pen    Sig: Inject 30 Units into the skin 2 (two) times daily.    Dispense:  18 mL    Refill:  1    Supervising Provider:   SNIDER, CYNTHIA [[id number]]   omeprazole  (PRILOSEC) 20 MG capsule    Sig: Take 1 capsule (20 mg total) by mouth daily.    Dispense:  30 capsule    Refill:  5    Supervising Provider:   Liane Redman [[id number]]   Pitavastatin  Magnesium  4 MG TABS    Sig: Take 1 tablet (4 mg total) by mouth daily.    Dispense:  30 tablet    Refill:  6    Supervising Provider:   SNIDER, CYNTHIA [[id number]]   sitaGLIPtin  (JANUVIA ) 100 MG tablet    Sig: Take 1 tablet (100 mg total) by mouth daily.    Dispense:  30 tablet    Refill:  5    Supervising Provider:   Liane Redman [[id number]]     Follow-up: Return in about 3 months (around 12/30/2023). or sooner if needed.    Marlan Silva, MSN, FNP-C Nurse Practitioner [facility] Main number: [phone]

## 2023-09-29 NOTE — Patient Instructions (Signed)
 Nice to see you.  Continue to take your medication daily as prescribed.  Refills have been sent to the pharmacy.  Plan for follow up in 3 months or sooner if needed with lab work on the same day.  Have a great day and stay safe!

## 2023-09-30 ENCOUNTER — Other Ambulatory Visit: Payer: Self-pay

## 2023-09-30 ENCOUNTER — Encounter: Payer: Self-pay | Admitting: Family

## 2023-09-30 MED ORDER — OMEPRAZOLE 20 MG PO CPDR: 20.0000 mg | DELAYED_RELEASE_CAPSULE | Freq: Every day | ORAL | 5 refills | Status: DC

## 2023-09-30 MED ORDER — SITAGLIPTIN PHOSPHATE 100 MG PO TABS: 100.0000 mg | ORAL_TABLET | Freq: Every day | ORAL | 5 refills | Status: DC

## 2023-09-30 MED ORDER — DAPAGLIFLOZIN PROPANEDIOL 10 MG PO TABS: 10.0000 mg | ORAL_TABLET | Freq: Every day | ORAL | 5 refills | Status: DC

## 2023-09-30 MED ORDER — ESCITALOPRAM OXALATE 20 MG PO TABS: 20.0000 mg | ORAL_TABLET | Freq: Every day | ORAL | 5 refills | Status: DC

## 2023-09-30 MED ORDER — BIKTARVY 50-200-25 MG PO TABS: 1.0000 | ORAL_TABLET | Freq: Every day | ORAL | 5 refills | Status: DC

## 2023-09-30 MED ORDER — LANTUS SOLOSTAR 100 UNIT/ML ~~LOC~~ SOPN: 30.0000 [IU] | PEN_INJECTOR | Freq: Two times a day (BID) | SUBCUTANEOUS | 1 refills | Status: DC

## 2023-09-30 MED ORDER — PITAVASTATIN MAGNESIUM 4 MG PO TABS
4.0000 mg | ORAL_TABLET | Freq: Every day | ORAL | 6 refills | Status: DC
Start: 2023-09-30 — End: 2024-01-12

## 2023-09-30 MED ORDER — HYDROCHLOROTHIAZIDE 25 MG PO TABS
25.0000 mg | ORAL_TABLET | Freq: Every day | ORAL | 5 refills | Status: DC
Start: 2023-09-30 — End: 2024-01-12

## 2023-09-30 NOTE — Assessment & Plan Note (Signed)
 Mood has remained stable on Lexapro  with no adverse side effects.  No suicidal ideations or signs of psychosis.  Continue current dose of Lexapro .

## 2023-09-30 NOTE — Assessment & Plan Note (Addendum)
 Discussed importance of safe sexual practice and condom use. Condoms and site specific STD testing offered.  Vaccinations reviewed  Awaiting dental care appointment.  Due for colon cancer screening and counseled on colonoscopy.  Due for anal cancer screening and will consider at next visit.

## 2023-09-30 NOTE — Assessment & Plan Note (Signed)
 Blood pressure near goal with good adherence and tolerance to medications.  Encouraged to continue to monitor blood pressure at home and follow low-sodium/DASH diet.  Continue current dose of hydrochlorothiazide .

## 2023-09-30 NOTE — Assessment & Plan Note (Signed)
 Diabetes remains poorly controlled with A1c of 10.2% although appears fasting blood sugars are improving. Encouraged to check preprandial blood sugars for any elevation as may need short acting insulin . Continue current dose of semaglutide , lantus , and dapagliflozin . Recommended to establish with Endocrinology.

## 2023-09-30 NOTE — Assessment & Plan Note (Signed)
 Adam Chan continues to have well-controlled virus with good adherence and tolerance to Biktarvy .  Reviewed lab work and discussed plan of care and U equals U.  Social determinants of health reviewed with no interventions indicated.  Covered by [employer] and will need to be renewed prior to the end of the year.  Continue current dose of Biktarvy .  Plan for follow-up in 3 months or sooner if needed with lab work 1 to 2 weeks prior to appointment.

## 2023-09-30 NOTE — Assessment & Plan Note (Signed)
 This is a chronic problem having been seen in Urology previously and less than optimal effects from sildenafil. Suspect it is multifactorial given multiple medical conditions and medications contributing. Will try tadalafil. Counseled on adverse risks of medication. May need follow up with Urology if symptoms persist.

## 2023-10-04 ENCOUNTER — Other Ambulatory Visit: Payer: Self-pay

## 2023-10-12 ENCOUNTER — Other Ambulatory Visit: Payer: Self-pay

## 2023-10-13 ENCOUNTER — Other Ambulatory Visit: Payer: Self-pay

## 2023-10-20 MED ORDER — DAPAGLIFLOZIN PROPANEDIOL 10 MG PO TABS: 10.0000 mg | ORAL_TABLET | Freq: Every day | ORAL | 5 refills | Status: DC

## 2023-10-25 ENCOUNTER — Other Ambulatory Visit: Payer: Self-pay | Admitting: Family

## 2023-11-14 ENCOUNTER — Ambulatory Visit: Payer: Self-pay

## 2023-11-14 ENCOUNTER — Other Ambulatory Visit: Payer: Self-pay

## 2023-11-28 ENCOUNTER — Other Ambulatory Visit: Payer: Self-pay

## 2023-11-28 DIAGNOSIS — E1165 Type 2 diabetes mellitus with hyperglycemia: Secondary | ICD-10-CM

## 2023-11-28 DIAGNOSIS — Z79899 Other long term (current) drug therapy: Secondary | ICD-10-CM

## 2023-11-28 DIAGNOSIS — B20 Human immunodeficiency virus [HIV] disease: Secondary | ICD-10-CM

## 2023-11-28 DIAGNOSIS — Z113 Encounter for screening for infections with a predominantly sexual mode of transmission: Secondary | ICD-10-CM

## 2023-11-30 ENCOUNTER — Other Ambulatory Visit: Payer: Self-pay | Admitting: Family

## 2023-12-01 ENCOUNTER — Other Ambulatory Visit: Payer: Self-pay

## 2023-12-01 DIAGNOSIS — E1165 Type 2 diabetes mellitus with hyperglycemia: Secondary | ICD-10-CM

## 2023-12-01 DIAGNOSIS — Z113 Encounter for screening for infections with a predominantly sexual mode of transmission: Secondary | ICD-10-CM

## 2023-12-01 DIAGNOSIS — Z79899 Other long term (current) drug therapy: Secondary | ICD-10-CM

## 2023-12-01 DIAGNOSIS — B20 Human immunodeficiency virus [HIV] disease: Secondary | ICD-10-CM

## 2023-12-02 LAB — T-HELPER CELL (CD4) - (RCID CLINIC ONLY)
CD4 % Helper T Cell: 42 % (ref 33–65)
CD4 T Cell Abs: 1049 /uL (ref 400–1790)

## 2023-12-07 ENCOUNTER — Other Ambulatory Visit: Payer: Self-pay | Admitting: Family

## 2023-12-07 LAB — LIPID PANEL
Cholesterol: 144 mg/dL (ref <200)
HDL: 49 mg/dL (ref >=40)
LDL Cholesterol (Calc): 77 mg/dL
Non-HDL Cholesterol (Calc): 95 mg/dL (ref <130)
Total CHOL/HDL Ratio: 2.9 (calc) (ref <5.0)
Triglycerides: 97 mg/dL (ref <150)

## 2023-12-07 LAB — HIV-1 RNA QUANT-NO REFLEX-BLD
HIV 1 RNA Quant: 23 {copies}/mL — ABNORMAL HIGH
HIV-1 RNA Quant, Log: 1.36 {Log_copies}/mL — ABNORMAL HIGH

## 2023-12-07 LAB — RPR TITER: RPR Titer: 1:128 {titer} — ABNORMAL HIGH

## 2023-12-07 LAB — HEMOGLOBIN A1C
Hgb A1c MFr Bld: 10.6 % — ABNORMAL HIGH (ref <5.7)
Mean Plasma Glucose: 258 mg/dL
eAG (mmol/L): 14.3 mmol/L

## 2023-12-07 LAB — T PALLIDUM AB: T Pallidum Abs: POSITIVE — AB

## 2023-12-07 LAB — RPR: RPR Ser Ql: REACTIVE — AB

## 2023-12-07 NOTE — Telephone Encounter (Signed)
 Per Cathlyn July, NP, continue Lantus  at 30 units BID for now. Dose may be adjusted at next appointment.   Medication should not need PA since patient has [employer].   Spoke with [facility], they state that provider will need to contact patient's plan to override for high dose.   Notified [facility] that patient has [employer] and is not commercially insured. They said their system states provider should contact [employer].   Including [facility] pharmacy team for assistance.   Wynston Romey, BSN, RN

## 2023-12-08 ENCOUNTER — Other Ambulatory Visit (HOSPITAL_COMMUNITY): Payer: Self-pay

## 2023-12-11 ENCOUNTER — Other Ambulatory Visit: Payer: Self-pay | Admitting: Family

## 2023-12-11 DIAGNOSIS — B20 Human immunodeficiency virus [HIV] disease: Secondary | ICD-10-CM

## 2023-12-12 ENCOUNTER — Ambulatory Visit: Payer: Self-pay | Admitting: Family

## 2023-12-12 ENCOUNTER — Other Ambulatory Visit: Payer: Self-pay | Admitting: Family

## 2023-12-12 DIAGNOSIS — A539 Syphilis, unspecified: Secondary | ICD-10-CM

## 2023-12-12 MED ORDER — DOXYCYCLINE HYCLATE 100 MG PO TABS: 100.0000 mg | ORAL_TABLET | Freq: Two times a day (BID) | ORAL | 0 refills | Status: DC

## 2023-12-12 NOTE — Telephone Encounter (Signed)
 Spoke with [facility], they requested that prescription be sent again.   Chriselda Leppert, BSN, RN

## 2023-12-12 NOTE — Telephone Encounter (Signed)
Appt 8/7

## 2023-12-15 ENCOUNTER — Ambulatory Visit: Payer: Self-pay | Admitting: Family

## 2023-12-19 ENCOUNTER — Other Ambulatory Visit: Payer: Self-pay | Admitting: Family

## 2023-12-19 DIAGNOSIS — F3342 Major depressive disorder, recurrent, in full remission: Secondary | ICD-10-CM

## 2023-12-19 NOTE — Telephone Encounter (Signed)
 5 refills were sent 09/2023

## 2023-12-26 ENCOUNTER — Other Ambulatory Visit: Payer: Self-pay | Admitting: Family

## 2023-12-26 ENCOUNTER — Other Ambulatory Visit: Payer: Self-pay

## 2023-12-26 DIAGNOSIS — E1165 Type 2 diabetes mellitus with hyperglycemia: Secondary | ICD-10-CM

## 2023-12-26 MED ORDER — TRUEPLUS LANCETS 28G MISC: 12 refills | Status: AC

## 2024-01-12 ENCOUNTER — Ambulatory Visit: Payer: Self-pay | Admitting: Family

## 2024-01-12 ENCOUNTER — Other Ambulatory Visit: Payer: Self-pay

## 2024-01-12 ENCOUNTER — Encounter: Payer: Self-pay | Admitting: Family

## 2024-01-12 ENCOUNTER — Other Ambulatory Visit (HOSPITAL_COMMUNITY): Payer: Self-pay

## 2024-01-12 VITALS — BP 144/93 | HR 98 | Temp 98.2°F | Wt 191.0 lb

## 2024-01-12 DIAGNOSIS — F330 Major depressive disorder, recurrent, mild: Secondary | ICD-10-CM

## 2024-01-12 DIAGNOSIS — E1165 Type 2 diabetes mellitus with hyperglycemia: Secondary | ICD-10-CM

## 2024-01-12 DIAGNOSIS — Z0000 Encounter for general adult medical examination without abnormal findings: Secondary | ICD-10-CM

## 2024-01-12 DIAGNOSIS — K219 Gastro-esophageal reflux disease without esophagitis: Secondary | ICD-10-CM

## 2024-01-12 DIAGNOSIS — F3342 Major depressive disorder, recurrent, in full remission: Secondary | ICD-10-CM

## 2024-01-12 DIAGNOSIS — A539 Syphilis, unspecified: Secondary | ICD-10-CM

## 2024-01-12 DIAGNOSIS — N529 Male erectile dysfunction, unspecified: Secondary | ICD-10-CM

## 2024-01-12 DIAGNOSIS — I10 Essential (primary) hypertension: Secondary | ICD-10-CM

## 2024-01-12 DIAGNOSIS — B20 Human immunodeficiency virus [HIV] disease: Secondary | ICD-10-CM

## 2024-01-12 DIAGNOSIS — Z794 Long term (current) use of insulin: Secondary | ICD-10-CM

## 2024-01-12 MED ORDER — INSULIN LISPRO (0.5 UNIT DIAL) 100 UNIT/ML (KWIKPEN JR): PEN_INJECTOR | SUBCUTANEOUS | 1 refills | Status: DC

## 2024-01-12 MED ORDER — DAPAGLIFLOZIN PROPANEDIOL 10 MG PO TABS: 10.0000 mg | ORAL_TABLET | Freq: Every day | ORAL | 5 refills | Status: DC

## 2024-01-12 MED ORDER — ESCITALOPRAM OXALATE 20 MG PO TABS: 20.0000 mg | ORAL_TABLET | Freq: Every day | ORAL | 5 refills | Status: DC

## 2024-01-12 MED ORDER — LANTUS SOLOSTAR 100 UNIT/ML ~~LOC~~ SOPN: 48.0000 [IU] | PEN_INJECTOR | Freq: Every day | SUBCUTANEOUS | 1 refills | Status: DC

## 2024-01-12 MED ORDER — ONDANSETRON HCL 8 MG PO TABS: 8.0000 mg | ORAL_TABLET | Freq: Three times a day (TID) | ORAL | 0 refills | Status: DC | PRN

## 2024-01-12 MED ORDER — HYDROCHLOROTHIAZIDE 25 MG PO TABS: 25.0000 mg | ORAL_TABLET | Freq: Every day | ORAL | 5 refills | Status: DC

## 2024-01-12 MED ORDER — OMEPRAZOLE 20 MG PO CPDR: 20.0000 mg | DELAYED_RELEASE_CAPSULE | Freq: Every day | ORAL | 5 refills | Status: DC

## 2024-01-12 MED ORDER — BIKTARVY 50-200-25 MG PO TABS: 1.0000 | ORAL_TABLET | Freq: Every day | ORAL | 5 refills | Status: DC

## 2024-01-12 MED ORDER — PITAVASTATIN MAGNESIUM 4 MG PO TABS: 4.0000 mg | ORAL_TABLET | Freq: Every day | ORAL | 6 refills | Status: DC

## 2024-01-12 NOTE — Patient Instructions (Addendum)
 Nice to see you.  Continue to take your medication daily as prescribed.  Change Lantus  to 48 units daily and start Humalog  to 4 units after largest meal. (Goal <180 after 1-2 hours post eating).   Refills have been sent to the pharmacy.  Plan for follow up in 3 months or sooner if needed with lab work on the same 1-2 weeks prior to appointment.   Have a great day and stay safe!

## 2024-01-12 NOTE — Progress Notes (Unsigned)
 Brief Narrative   Patient ID: Adam Chan, male    DOB: 1970/09/16, 53 y.o.   MRN: [id number]  Adam Chan is a 53 y/o Hispanic male diagnosed with HIV disease around September 2018 with risk factor of bisexual contact. Initial CD4 count and viral load are unavailable. Genotype on 10/19/16 with K103N. No history of opportunistic infection. [id number] negative. ART regimen initially Genvoya which caused nausea and changed to Biktarvy .    Subjective:   Chief Complaint  Patient presents with   Follow-up    B20, diabetes, hypertension     HPI:  Adam Chan is a 53 y.o. male with HIV disease, diabetes, and hypertension last seen on 09/29/23 with well controlled HIV and good adherence and tolerance to Biktarvy . HIV viral load was undetectable and CD4 count 883. Diabetes was poorly controlled despite addition of lantus . Most recent completed on 12/01/2023 with viral load that remains undetectable and CD4 count 1049.  Diabetes remains poorly controlled with hemoglobin A1c of 10.6.  Found to have increased positive syphilis titer at 1: 128 and treated with doxycycline .  Here today for routine follow-up.  Adam Chan has been doing okay since his last office visit and continues to take Biktarvy  as prescribed with no adverse side effects or problems obtaining medication from the pharmacy.  Covered by [employer].  Continues to take Lantus  and diabetes medications.  Not currently checking blood sugars.  Eating approximately 1 meal per day.  Continues to have neuropathy.  Has questions about syphilis.  Housing, transportation, and access to food are stable.  Healthcare maintenance reviewed.  Condoms and site-specific STD testing offered.  Denies fevers, chills, night sweats, headaches, changes in vision, neck pain/stiffness, nausea, diarrhea, vomiting, lesions or rashes.  Lab Results  Component Value Date   CD4TCELL 42 12/01/2023   CD4TABS 1,049 12/01/2023   Lab Results  Component Value Date    HIV1RNAQUANT 23 (H) 12/01/2023     Allergies  Allergen Reactions   Elemental Sulfur Other (See Comments)   Sulfa Antibiotics Anaphylaxis      Outpatient Medications Prior to Visit  Medication Sig Dispense Refill   acetaminophen  (TYLENOL ) 325 MG tablet Take 2 tablets (650 mg total) by mouth every 6 (six) hours as needed. 36 tablet 0   bacitracin  ointment Apply 1 Application topically 2 (two) times daily. 28 g 0   Blood Glucose Monitoring Suppl (TRUE METRIX METER) w/Device KIT Use to check blood sugars each morning and as directed. 1 kit 0   glucose blood (TRUE METRIX BLOOD GLUCOSE TEST) test strip Use as instructed 100 each 12   naproxen  (NAPROSYN ) 375 MG tablet Take 1 tablet (375 mg total) by mouth 2 (two) times daily with a meal. 14 tablet 0   prochlorperazine  (COMPAZINE ) 10 MG tablet Take 1 tablet (10 mg total) by mouth 2 (two) times daily as needed for nausea or vomiting. 10 tablet 0   Semaglutide , 1 MG/DOSE, 4 MG/3ML SOPN Inject 1 mg into the skin once a week. 3 mL 12   TRUEPLUS 5-BEVEL PEN NEEDLES 32G X 4 MM MISC USE 1 NEEDLE PER INJECTION OF LANTUS  DAILY. DO NOT RE-USE NEEDLES 100 each 1   TRUEplus Lancets 28G MISC Use as instructed 100 each 12   bictegravir-emtricitabine-tenofovir AF (BIKTARVY ) 50-200-25 MG TABS tablet Take 1 tablet by mouth daily. 30 tablet 5   dapagliflozin  propanediol (FARXIGA ) 10 MG TABS tablet Take 1 tablet (10 mg total) by mouth daily before breakfast. 30 tablet 5   diazepam  (VALIUM )  2 MG tablet Take 1 tablet (2 mg total) by mouth every 6 (six) hours as needed for muscle spasms. 20 tablet 0   doxycycline  (VIBRA -TABS) 100 MG tablet Take 1 tablet (100 mg total) by mouth 2 (two) times daily. 28 tablet 0   escitalopram  (LEXAPRO ) 20 MG tablet Take 1 tablet (20 mg total) by mouth daily. 30 tablet 5   hydrochlorothiazide  (HYDRODIURIL ) 25 MG tablet Take 1 tablet (25 mg total) by mouth daily. 30 tablet 5   JANUVIA  100 MG tablet TAKE 1 TABLET(100 MG) BY MOUTH DAILY 30  tablet 5   LANTUS  SOLOSTAR 100 UNIT/ML Solostar Pen ADMINISTER 30 UNITS UNDER THE SKIN TWICE DAILY 18 mL 1   omeprazole  (PRILOSEC) 20 MG capsule Take 1 capsule (20 mg total) by mouth daily. 30 capsule 5   ondansetron  (ZOFRAN ) 8 MG tablet TAKE 1 TABLET(8 MG) BY MOUTH EVERY 8 HOURS AS NEEDED FOR NAUSEA 20 tablet 0   Pitavastatin  Magnesium  4 MG TABS Take 1 tablet (4 mg total) by mouth daily. 30 tablet 6   tadalafil  (CIALIS ) 10 MG tablet Take 1 tablet (10 mg total) by mouth daily as needed for erectile dysfunction. 30 tablet 0   No facility-administered medications prior to visit.     Past Medical History:  Diagnosis Date   Depression 04/06/2018   Diabetes type 1, controlled (HCC) 04/06/2018   HIV (human immunodeficiency virus infection) (HCC) 04/06/2018   Hypertension    Nausea in adult 04/06/2018   Proctitis 02/04/2021     Past Surgical History:  Procedure Laterality Date   Bilaterla mesh for Hernia Bilateral 2009   CHOLECYSTECTOMY          Review of Systems  Constitutional:  Negative for appetite change, chills, fatigue, fever and unexpected weight change.  Eyes:  Negative for visual disturbance.  Respiratory:  Negative for cough, chest tightness, shortness of breath and wheezing.   Cardiovascular:  Negative for chest pain and leg swelling.  Gastrointestinal:  Negative for abdominal pain, constipation, diarrhea, nausea and vomiting.  Genitourinary:  Negative for dysuria, flank pain, frequency, genital sores, hematuria and urgency.  Skin:  Negative for rash.  Allergic/Immunologic: Negative for immunocompromised state.  Neurological:  Negative for dizziness and headaches.     Objective:   BP (!) 144/93   Pulse 98   Temp 98.2 F (36.8 C) (Oral)   Wt 191 lb (86.6 kg)   SpO2 94%   BMI 29.91 kg/m  Nursing note and vital signs reviewed.  Physical Exam Constitutional:      General: He is not in acute distress.    Appearance: He is well-developed.  Eyes:      Conjunctiva/sclera: Conjunctivae normal.  Cardiovascular:     Rate and Rhythm: Normal rate and regular rhythm.     Heart sounds: Normal heart sounds. No murmur heard.    No friction rub. No gallop.  Pulmonary:     Effort: Pulmonary effort is normal. No respiratory distress.     Breath sounds: Normal breath sounds. No wheezing or rales.  Chest:     Chest wall: No tenderness.  Abdominal:     General: Bowel sounds are normal.     Palpations: Abdomen is soft.     Tenderness: There is no abdominal tenderness.  Musculoskeletal:     Cervical back: Neck supple.  Lymphadenopathy:     Cervical: No cervical adenopathy.  Skin:    General: Skin is warm and dry.     Findings: No rash.  Neurological:  Mental Status: He is alert and oriented to person, place, and time.  Psychiatric:        Behavior: Behavior normal.        Thought Content: Thought content normal.        Judgment: Judgment normal.          01/12/2024    3:10 PM 09/29/2023    4:31 PM 03/07/2023    3:15 PM 11/22/2022    3:36 PM 08/25/2022    3:29 PM  Depression screen PHQ 2/9  Decreased Interest 0 1 0 0 0  Down, Depressed, Hopeless 0 0 0 0 0  PHQ - 2 Score 0 1 0 0 0  Altered sleeping 0 0     Tired, decreased energy 0 2     Change in appetite 0 0     Feeling bad or failure about yourself  0 0     Trouble concentrating 0 0     Moving slowly or fidgety/restless 0 0     Suicidal thoughts 0 0     PHQ-9 Score 0 3     Difficult doing work/chores  Not difficult at all           01/12/2024    3:11 PM 09/29/2023    4:32 PM  GAD 7 : Generalized Anxiety Score  Nervous, Anxious, on Edge 0 1  Control/stop worrying 0 2  Worry too much - different things 0 2  Trouble relaxing 0 1  Restless 0 0  Easily annoyed or irritable 0 0  Afraid - awful might happen 0 0  Total GAD 7 Score 0 6  Anxiety Difficulty Not difficult at all      The 10-year ASCVD risk score (Arnett DK, et al., 2019) is: 10.6%   Values used to calculate the  score:     Age: 8 years     Clincally relevant sex: Male     Is Non-Hispanic African American: No     Diabetic: Yes     Tobacco smoker: No     Systolic Blood Pressure: 144 mmHg     Is BP treated: Yes     HDL Cholesterol: 49 mg/dL     Total Cholesterol: 144 mg/dL      Assessment & Plan:    Patient Active Problem List   Diagnosis Date Noted   Abdominal pain 05/14/2022   At risk for colon cancer 05/13/2022   Syphilis 11/18/2020   Early syphilis, latent 06/13/2019   Type 2 diabetes mellitus with hyperglycemia, without long-term current use of insulin  (HCC) 06/02/2018   Recurrent major depressive disorder, in full remission (HCC) 06/02/2018   Essential hypertension 06/02/2018   Healthcare maintenance 06/02/2018   Nausea 02/27/2018   Elevated liver enzymes 09/27/2017   Depression 08/30/2017   DOE (dyspnea on exertion) 08/30/2017   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 No adverse side effects or neurological/ophthalmologic signs/symptoms. Continue to monitor blood pressure at home and follow low sodium and DASH type diet. Continue current dose of hydrochlorothiazide .      Relevant Medications   hydrochlorothiazide  (HYDRODIURIL ) 25 MG tablet   Pitavastatin  Magnesium  4 MG TABS     Endocrine   Type 2 diabetes mellitus with hyperglycemia, without long-term current use of insulin  (HCC) -  Primary   Adam Chan type 2 diabetes poorly controlled with hemoglobin A1c of 10.6.  Likely has overbasalization based on him increasing his dose of Lantus . Discussed importance of monitoring blood sugars at home an work to obtain CGM. Have recommended he go to Endocrinology for further evaluation. For now decrease Lantus  to 48 units daily and start Insulin  Lispro 4  units after meals with goal blood sugar <180 1-2 hours after eating. Continue current dose of semaglutide  and dapagliflozin . Discontinue Januvia . Monitor fasting blood sugar and at post-prandial following largest meal. May need to expand to other meals. Plan for follow up in 3 months or sooner if needed.       Relevant Medications   Pitavastatin  Magnesium  4 MG TABS   insulin  glargine (LANTUS  SOLOSTAR) 100 UNIT/ML Solostar Pen   Insulin  lispro (HUMALOG  JUNIOR KWIKPEN) 100 UNIT/ML   dapagliflozin  propanediol (FARXIGA ) 10 MG TABS tablet     Other   Human immunodeficiency virus (HIV) disease (HCC)   Adam Chan continues to have well-controlled virus with good adherence and tolerance to Biktarvy .  Reviewed lab work and discussed plan of care and U equals U.  Covered by [employer] which has been renewed.  Social determinants of health reviewed no interventions indicated.  Continue current dose of Biktarvy .  Plan for follow-up in 3 months or sooner if needed with lab work on the same day.      Relevant Medications   bictegravir-emtricitabine-tenofovir AF (BIKTARVY ) 50-200-25 MG TABS tablet   ondansetron  (ZOFRAN ) 8 MG tablet   Recurrent major depressive disorder, in full remission (HCC)   Relevant Medications   escitalopram  (LEXAPRO ) 20 MG tablet   Healthcare maintenance   Discussed importance of safe sexual practice and condom use. Condoms and site specific STD testing offered.  Vaccinations deferred following counseling Due for colon cancer screening.  Awaiting dental appointment.       Syphilis   Syphilis titer continues to increase and treated with doxycycline  100 mg x 14 days. Unclear new infection as he is not currently sexually active. No current symptoms. Recheck RPR in 3 months.       Relevant Medications   bictegravir-emtricitabine-tenofovir AF (BIKTARVY ) 50-200-25 MG TABS tablet   Depression   Stable with current dose of escitalopram  with no adverse side effects. No suicidal  ideation or signs of psychosis. Continue current dose of escitalopram .       Relevant Medications   escitalopram  (LEXAPRO ) 20 MG tablet   Erectile dysfunction of organic origin   Previously prescribed tadalafil  and sildenafil with no improvements in symptoms.  Suspect this is likely related to both medication and multiple comorbidities.  Can refer to urology if interested.      Other Visit Diagnoses       Gastroesophageal reflux disease without esophagitis       Relevant Medications   omeprazole  (PRILOSEC) 20 MG capsule   ondansetron  (ZOFRAN ) 8 MG tablet        I have discontinued Kaynen Shutes's diazepam , tadalafil , Januvia , and doxycycline . I have also changed his ondansetron  and Lantus  SoloStar. Additionally, I am having him start on Insulin  lispro. Lastly, I am having him maintain his Semaglutide  (1 MG/DOSE), acetaminophen , True Metrix Meter, bacitracin , naproxen , prochlorperazine , True Metrix Blood Glucose Test, TRUEplus 5-Bevel Pen Needles, TRUEplus Lancets 28G, Biktarvy , omeprazole , hydrochlorothiazide , Pitavastatin  Magnesium , escitalopram , and dapagliflozin  propanediol.   Meds ordered this encounter  Medications   bictegravir-emtricitabine-tenofovir AF (BIKTARVY ) 50-200-25 MG TABS tablet    Sig: Take 1 tablet by mouth daily.  Dispense:  30 tablet    Refill:  5    Supervising Provider:   SNIDER, CYNTHIA [id number]    Prescription Type::   Renewal   omeprazole  (PRILOSEC) 20 MG capsule    Sig: Take 1 capsule (20 mg total) by mouth daily.    Dispense:  30 capsule    Refill:  5    Supervising Provider:   SNIDER, CYNTHIA [[id number]]   hydrochlorothiazide  (HYDRODIURIL ) 25 MG tablet    Sig: Take 1 tablet (25 mg total) by mouth daily.    Dispense:  30 tablet    Refill:  5    Supervising Provider:   SNIDER, CYNTHIA [[id number]]   ondansetron  (ZOFRAN ) 8 MG tablet    Sig: Take 1 tablet (8 mg total) by mouth every 8 (eight) hours as needed for nausea or vomiting.    Dispense:  20 tablet     Refill:  0    Supervising Provider:   LUIZ CHANNEL [[id number]]   Pitavastatin  Magnesium  4 MG TABS    Sig: Take 1 tablet (4 mg total) by mouth daily.    Dispense:  30 tablet    Refill:  6    Supervising Provider:   SNIDER, CYNTHIA [[id number]]   escitalopram  (LEXAPRO ) 20 MG tablet    Sig: Take 1 tablet (20 mg total) by mouth daily.    Dispense:  30 tablet    Refill:  5    Supervising Provider:   SNIDER, CYNTHIA [[id number]]   insulin  glargine (LANTUS  SOLOSTAR) 100 UNIT/ML Solostar Pen    Sig: Inject 48 Units into the skin daily.    Dispense:  18 mL    Refill:  1    Supervising Provider:   SNIDER, CYNTHIA [[id number]]   Insulin  lispro (HUMALOG  JUNIOR KWIKPEN) 100 UNIT/ML    Sig: Inject 4 units subcutaneously after largest meal of the day    Dispense:  3 mL    Refill:  1    Supervising Provider:   SNIDER, CYNTHIA [[id number]]   dapagliflozin  propanediol (FARXIGA ) 10 MG TABS tablet    Sig: Take 1 tablet (10 mg total) by mouth daily before breakfast.    Dispense:  30 tablet    Refill:  5    Supervising Provider:   LUIZ CHANNEL [[id number]]     Follow-up: Return in about 3 months (around 04/12/2024). or sooner if needed.    Cathlyn July, MSN, FNP-C Nurse Practitioner [facility] Main number: [phone]

## 2024-01-13 ENCOUNTER — Encounter: Payer: Self-pay | Admitting: Family

## 2024-01-13 NOTE — Assessment & Plan Note (Signed)
 Adam Chan continues to have well-controlled virus with good adherence and tolerance to Biktarvy .  Reviewed lab work and discussed plan of care and U equals U.  Covered by [employer] which has been renewed.  Social determinants of health reviewed no interventions indicated.  Continue current dose of Biktarvy .  Plan for follow-up in 3 months or sooner if needed with lab work on the same day.

## 2024-01-13 NOTE — Assessment & Plan Note (Signed)
 Previously prescribed tadalafil  and sildenafil with no improvements in symptoms.  Suspect this is likely related to both medication and multiple comorbidities.  Can refer to urology if interested.

## 2024-01-13 NOTE — Assessment & Plan Note (Signed)
 Discussed importance of safe sexual practice and condom use. Condoms and site specific STD testing offered.  Vaccinations deferred following counseling Due for colon cancer screening.  Awaiting dental appointment.

## 2024-01-13 NOTE — Assessment & Plan Note (Signed)
 Stable with current dose of escitalopram  with no adverse side effects. No suicidal ideation or signs of psychosis. Continue current dose of escitalopram .

## 2024-01-13 NOTE — Assessment & Plan Note (Signed)
 Adam Chan type 2 diabetes poorly controlled with hemoglobin A1c of 10.6.  Likely has overbasalization based on him increasing his dose of Lantus . Discussed importance of monitoring blood sugars at home an work to obtain CGM. Have recommended he go to Endocrinology for further evaluation. For now decrease Lantus  to 48 units daily and start Insulin  Lispro 4 units after meals with goal blood sugar <180 1-2 hours after eating. Continue current dose of semaglutide  and dapagliflozin . Discontinue Januvia . Monitor fasting blood sugar and at post-prandial following largest meal. May need to expand to other meals. Plan for follow up in 3 months or sooner if needed.

## 2024-01-13 NOTE — Assessment & Plan Note (Signed)
 Blood pressure slightly elevated above goal. No adverse side effects or neurological/ophthalmologic signs/symptoms. Continue to monitor blood pressure at home and follow low sodium and DASH type diet. Continue current dose of hydrochlorothiazide .

## 2024-01-13 NOTE — Assessment & Plan Note (Addendum)
 Syphilis titer continues to increase and treated with doxycycline  100 mg x 14 days. Unclear new infection as he is not currently sexually active. No current symptoms. Recheck RPR in 3 months.

## 2024-02-02 ENCOUNTER — Telehealth: Payer: Self-pay

## 2024-02-02 NOTE — Telephone Encounter (Signed)
 4 BOXES CAME IN PATIENT CAN PICK WHEN NEEDED

## 2024-02-07 ENCOUNTER — Other Ambulatory Visit: Payer: Self-pay

## 2024-02-07 MED ORDER — OMNIPOD 5 DEXG7G6 INTRO GEN 5 KIT: PACK | 0 refills | Status: DC

## 2024-02-07 MED ORDER — OMNIPOD 5 DEXG7G6 PODS GEN 5 MISC: 11 refills | Status: DC

## 2024-02-22 LAB — OPHTHALMOLOGY REPORT-SCANNED

## 2024-02-22 MED ORDER — AMOXICILLIN-POT CLAVULANATE 875-125 MG PO TABS: 1.0000 | ORAL_TABLET | Freq: Two times a day (BID) | ORAL | 0 refills | Status: DC

## 2024-03-04 ENCOUNTER — Other Ambulatory Visit: Payer: Self-pay | Admitting: Family

## 2024-03-04 DIAGNOSIS — B20 Human immunodeficiency virus [HIV] disease: Secondary | ICD-10-CM

## 2024-03-05 ENCOUNTER — Other Ambulatory Visit: Payer: Self-pay | Admitting: Family

## 2024-03-05 DIAGNOSIS — B20 Human immunodeficiency virus [HIV] disease: Secondary | ICD-10-CM

## 2024-03-12 ENCOUNTER — Other Ambulatory Visit: Payer: Self-pay | Admitting: Family

## 2024-03-12 NOTE — Telephone Encounter (Signed)
 Ok to refill

## 2024-03-14 ENCOUNTER — Ambulatory Visit: Payer: Self-pay

## 2024-03-14 ENCOUNTER — Other Ambulatory Visit: Payer: Self-pay

## 2024-03-21 ENCOUNTER — Other Ambulatory Visit: Payer: Self-pay

## 2024-03-21 DIAGNOSIS — B20 Human immunodeficiency virus [HIV] disease: Secondary | ICD-10-CM

## 2024-03-21 DIAGNOSIS — Z113 Encounter for screening for infections with a predominantly sexual mode of transmission: Secondary | ICD-10-CM

## 2024-03-26 ENCOUNTER — Other Ambulatory Visit: Payer: Self-pay

## 2024-03-26 DIAGNOSIS — Z113 Encounter for screening for infections with a predominantly sexual mode of transmission: Secondary | ICD-10-CM

## 2024-03-26 DIAGNOSIS — B20 Human immunodeficiency virus [HIV] disease: Secondary | ICD-10-CM

## 2024-03-28 LAB — T-HELPER CELL (CD4) - (RCID CLINIC ONLY)
CD4 % Helper T Cell: 44 % (ref 33–65)
CD4 T Cell Abs: 938 /uL (ref 400–1790)

## 2024-03-30 NOTE — Telephone Encounter (Signed)
 Lab team checking to see if A1c can be added to 11/17 blood work

## 2024-04-04 LAB — HEMOGLOBIN A1C: Hgb A1c MFr Bld: 9 % — ABNORMAL HIGH (ref <5.7)

## 2024-04-04 LAB — TEST AUTHORIZATION

## 2024-04-04 LAB — COMPLETE METABOLIC PANEL WITHOUT GFR
AG Ratio: 1.5 (calc) (ref 1.0–2.5)
ALT: 59 U/L — ABNORMAL HIGH (ref 9–46)
AST: 65 U/L — ABNORMAL HIGH (ref 10–35)
Albumin: 4.9 g/dL (ref 3.6–5.1)
Alkaline phosphatase (APISO): 72 U/L (ref 35–144)
BUN: 21 mg/dL (ref 7–25)
CO2: 25 mmol/L (ref 20–32)
Calcium: 9.9 mg/dL (ref 8.6–10.3)
Chloride: 97 mmol/L — ABNORMAL LOW (ref 98–110)
Creat: 0.95 mg/dL (ref 0.70–1.30)
Globulin: 3.3 g/dL (ref 1.9–3.7)
Glucose, Bld: 253 mg/dL — ABNORMAL HIGH (ref 65–99)
Potassium: 4.3 mmol/L (ref 3.5–5.3)
Sodium: 135 mmol/L (ref 135–146)
Total Bilirubin: 0.9 mg/dL (ref 0.2–1.2)
Total Protein: 8.2 g/dL — ABNORMAL HIGH (ref 6.1–8.1)

## 2024-04-04 LAB — CBC WITH DIFFERENTIAL/PLATELET
Absolute Lymphocytes: 2411 {cells}/uL (ref 850–3900)
Absolute Monocytes: 456 {cells}/uL (ref 200–950)
Basophils Absolute: 17 {cells}/uL (ref 0–200)
Basophils Relative: 0.3 %
Eosinophils Absolute: 51 {cells}/uL (ref 15–500)
Eosinophils Relative: 0.9 %
HCT: 48.9 % (ref 38.5–50.0)
Hemoglobin: 15.3 g/dL (ref 13.2–17.1)
MCH: 26.7 pg — ABNORMAL LOW (ref 27.0–33.0)
MCHC: 31.3 g/dL — ABNORMAL LOW (ref 32.0–36.0)
MCV: 85.2 fL (ref 80.0–100.0)
MPV: 9.2 fL (ref 7.5–12.5)
Monocytes Relative: 8 %
Neutro Abs: 2765 {cells}/uL (ref 1500–7800)
Neutrophils Relative %: 48.5 %
Platelets: 305 Thousand/uL (ref 140–400)
RBC: 5.74 Million/uL (ref 4.20–5.80)
RDW: 16.4 % — ABNORMAL HIGH (ref 11.0–15.0)
Total Lymphocyte: 42.3 %
WBC: 5.7 Thousand/uL (ref 3.8–10.8)

## 2024-04-04 LAB — HIV-1 RNA QUANT-NO REFLEX-BLD
HIV 1 RNA Quant: 25 {copies}/mL — ABNORMAL HIGH
HIV-1 RNA Quant, Log: 1.4 {Log_copies}/mL — ABNORMAL HIGH

## 2024-04-04 LAB — SYPHILIS: RPR W/REFLEX TO RPR TITER AND TREPONEMAL ANTIBODIES, TRADITIONAL SCREENING AND DIAGNOSIS ALGORITHM: RPR Ser Ql: REACTIVE — AB

## 2024-04-04 LAB — T PALLIDUM AB: T Pallidum Abs: POSITIVE — AB

## 2024-04-04 LAB — RPR TITER: RPR Titer: 1:256 {titer} — ABNORMAL HIGH

## 2024-04-09 ENCOUNTER — Encounter: Payer: Self-pay | Admitting: Family

## 2024-04-09 ENCOUNTER — Other Ambulatory Visit: Payer: Self-pay

## 2024-04-09 ENCOUNTER — Ambulatory Visit: Payer: Self-pay | Admitting: Family

## 2024-04-09 VITALS — BP 136/94 | HR 99 | Temp 98.3°F | Resp 16 | Wt 181.4 lb

## 2024-04-09 DIAGNOSIS — B20 Human immunodeficiency virus [HIV] disease: Secondary | ICD-10-CM

## 2024-04-09 DIAGNOSIS — I10 Essential (primary) hypertension: Secondary | ICD-10-CM

## 2024-04-09 DIAGNOSIS — E1165 Type 2 diabetes mellitus with hyperglycemia: Secondary | ICD-10-CM

## 2024-04-09 DIAGNOSIS — A539 Syphilis, unspecified: Secondary | ICD-10-CM

## 2024-04-09 DIAGNOSIS — F3342 Major depressive disorder, recurrent, in full remission: Secondary | ICD-10-CM

## 2024-04-09 DIAGNOSIS — K219 Gastro-esophageal reflux disease without esophagitis: Secondary | ICD-10-CM

## 2024-04-09 DIAGNOSIS — Z79899 Other long term (current) drug therapy: Secondary | ICD-10-CM

## 2024-04-09 MED ORDER — TRUE METRIX BLOOD GLUCOSE TEST VI STRP
ORAL_STRIP | 12 refills | Status: AC
  Filled 2024-04-09: qty 100, 100d supply, fill #0

## 2024-04-09 MED ORDER — OMEPRAZOLE 20 MG PO CPDR: 20.0000 mg | DELAYED_RELEASE_CAPSULE | Freq: Every day | ORAL | 5 refills | Status: AC

## 2024-04-09 MED ORDER — PITAVASTATIN MAGNESIUM 4 MG PO TABS: 4.0000 mg | ORAL_TABLET | Freq: Every day | ORAL | 5 refills | Status: AC

## 2024-04-09 MED ORDER — LANTUS SOLOSTAR 100 UNIT/ML ~~LOC~~ SOPN: 24.0000 [IU] | PEN_INJECTOR | Freq: Every day | SUBCUTANEOUS | 1 refills | Status: AC

## 2024-04-09 MED ORDER — ESCITALOPRAM OXALATE 20 MG PO TABS: 20.0000 mg | ORAL_TABLET | Freq: Every day | ORAL | 5 refills | Status: AC

## 2024-04-09 MED ORDER — DAPAGLIFLOZIN PROPANEDIOL 10 MG PO TABS: 10.0000 mg | ORAL_TABLET | Freq: Every day | ORAL | 5 refills | Status: AC

## 2024-04-09 MED ORDER — SEMAGLUTIDE (1 MG/DOSE) 4 MG/3ML ~~LOC~~ SOPN: 2.0000 mg | PEN_INJECTOR | SUBCUTANEOUS | 12 refills | Status: AC

## 2024-04-09 MED ORDER — HYDROCHLOROTHIAZIDE 25 MG PO TABS: 25.0000 mg | ORAL_TABLET | Freq: Every day | ORAL | 5 refills | Status: AC

## 2024-04-09 MED ORDER — DOXYCYCLINE HYCLATE 100 MG PO TABS: 100.0000 mg | ORAL_TABLET | Freq: Two times a day (BID) | ORAL | 0 refills | Status: AC

## 2024-04-09 MED ORDER — ONDANSETRON 4 MG PO TBDP: 4.0000 mg | ORAL_TABLET | Freq: Three times a day (TID) | ORAL | 5 refills | Status: AC | PRN

## 2024-04-09 MED ORDER — BIKTARVY 50-200-25 MG PO TABS: 1.0000 | ORAL_TABLET | Freq: Every day | ORAL | 5 refills | Status: AC

## 2024-04-09 NOTE — Progress Notes (Unsigned)
 Brief Narrative   Patient ID: Adam Chan, male    DOB: [birth year], 53 y.o.   MRN: [id number]  Adam Chan is a 53 y/o Hispanic male diagnosed with HIV disease around September 2018 with risk factor of bisexual contact. Initial CD4 count and viral load are unavailable. Genotype on 10/19/16 with K103N. No history of opportunistic infection. [id number] negative. ART regimen initially Genvoya which caused nausea and changed to Biktarvy .    Subjective:   Chief Complaint  Patient presents with   Follow-up    B20     HPI:  Adam Chan is a 53 y.o. male with HIV disease last seen on 01/12/24 with well controlled virus and good adherence and tolerance to Biktarvy .  Viral load was undetectable with CD4 count 1049.  Kidney function, liver function, electrolytes within normal ranges.  RPR titer increased from 1: 64 up to 1: 128.  Hemoglobin A1c was 10.6.  Most recent lab work completed on 03/26/2024 with viral load that remains undetectable and CD4 count 938.  Hemoglobin A1c improved to 9.0.  Kidney function and electrolytes within normal ranges.  Liver function tests elevated with AST 65 and ALT 59.  RPR titer increased from 1: 1 28-1: 256.  Here today for routine follow-up.  Adam Chan has been doing okay since his last office visit and continues to take Biktarvy  as prescribed with no adverse side effects or problems obtaining medication from the pharmacy.  Covered by [employer].  Previously increased his semaglutide  up to 2 mg weekly and has noticed significant improvement in his blood sugars as well as experienced a 10 pound weight loss.  Continues to take Lantus  daily and is now down to 24 units.  Remains on Farxiga .  Taking hydrochlorothiazide  for hypertension with no adverse side effects.  Mood is stable with current dose of Lexapro  with no adverse side effects.  Working full-time with stable housing, transportation, and access to food.  Healthcare maintenance reviewed.  Has been drinking more  alcohol recently.   Denies fevers, chills, night sweats, headaches, changes in vision, neck pain/stiffness, nausea, diarrhea, vomiting, lesions or rashes.  Lab Results  Component Value Date   CD4TCELL 44 03/26/2024   CD4TABS 938 03/26/2024   Lab Results  Component Value Date   HIV1RNAQUANT 25 (H) 03/26/2024     Allergies  Allergen Reactions   Elemental Sulfur Other (See Comments)   Sulfa Antibiotics Anaphylaxis      Outpatient Medications Prior to Visit  Medication Sig Dispense Refill   acetaminophen  (TYLENOL ) 325 MG tablet Take 2 tablets (650 mg total) by mouth every 6 (six) hours as needed. 36 tablet 0   bacitracin  ointment Apply 1 Application topically 2 (two) times daily. 28 g 0   Blood Glucose Monitoring Suppl (TRUE METRIX METER) w/Device KIT Use to check blood sugars each morning and as directed. 1 kit 0   TRUEPLUS 5-BEVEL PEN NEEDLES 32G X 4 MM MISC USE 1 NEEDLE PER INJECTION OF LANTUS  DAILY. DO NOT RE-USE NEEDLES 100 each 1   TRUEplus Lancets 28G MISC Use as instructed 100 each 12   bictegravir-emtricitabine-tenofovir AF (BIKTARVY ) 50-200-25 MG TABS tablet Take 1 tablet by mouth daily. 30 tablet 5   dapagliflozin  propanediol (FARXIGA ) 10 MG TABS tablet Take 1 tablet (10 mg total) by mouth daily before breakfast. 30 tablet 5   escitalopram  (LEXAPRO ) 20 MG tablet Take 1 tablet (20 mg total) by mouth daily. 30 tablet 5   glucose blood (TRUE METRIX BLOOD GLUCOSE TEST) test  strip Use as instructed 100 each 12   hydrochlorothiazide  (HYDRODIURIL ) 25 MG tablet Take 1 tablet (25 mg total) by mouth daily. 30 tablet 5   Insulin  Disposable Pump (OMNIPOD 5 DEXG7G6 INTRO GEN 5) KIT Change pod every 72 hours 1 kit 0   Insulin  Disposable Pump (OMNIPOD 5 DEXG7G6 PODS GEN 5) MISC Change pod every 72  hours 10 each 11   insulin  glargine (LANTUS  SOLOSTAR) 100 UNIT/ML Solostar Pen Inject 48 Units into the skin daily. 18 mL 1   Insulin  Lispro Junior KwikPen (HUMALOG  JR) 100 UNIT/ML KwikPen  INJECT 4 UNIT UNDER THE SKIN AFTER LARGEST MEAL OF THE DAY DAILY 3 mL 1   naproxen  (NAPROSYN ) 375 MG tablet Take 1 tablet (375 mg total) by mouth 2 (two) times daily with a meal. 14 tablet 0   omeprazole  (PRILOSEC) 20 MG capsule Take 1 capsule (20 mg total) by mouth daily. 30 capsule 5   ondansetron  (ZOFRAN ) 8 MG tablet TAKE 1 TABLET(8 MG) BY MOUTH EVERY 8 HOURS AS NEEDED FOR NAUSEA OR VOMITING 20 tablet 0   Pitavastatin  Magnesium  4 MG TABS Take 1 tablet (4 mg total) by mouth daily. 30 tablet 6   prochlorperazine  (COMPAZINE ) 10 MG tablet Take 1 tablet (10 mg total) by mouth 2 (two) times daily as needed for nausea or vomiting. 10 tablet 0   Semaglutide , 1 MG/DOSE, 4 MG/3ML SOPN Inject 1 mg into the skin once a week. 3 mL 12   amoxicillin -clavulanate (AUGMENTIN ) 875-125 MG tablet Take 1 tablet by mouth 2 (two) times daily. (Patient not taking: Reported on 04/09/2024) 20 tablet 0   No facility-administered medications prior to visit.     Past Medical History:  Diagnosis Date   Depression 04/06/2018   Diabetes type 1, controlled (HCC) 04/06/2018   HIV (human immunodeficiency virus infection) (HCC) 04/06/2018   Hypertension    Nausea in adult 04/06/2018   Proctitis 02/04/2021     Past Surgical History:  Procedure Laterality Date   Bilaterla mesh for Hernia Bilateral 2009   CHOLECYSTECTOMY          Review of Systems  Constitutional:  Negative for appetite change, chills, fatigue, fever and unexpected weight change.  Eyes:  Negative for visual disturbance.  Respiratory:  Negative for cough, chest tightness, shortness of breath and wheezing.   Cardiovascular:  Negative for chest pain and leg swelling.  Gastrointestinal:  Negative for abdominal pain, constipation, diarrhea, nausea and vomiting.  Genitourinary:  Negative for dysuria, flank pain, frequency, genital sores, hematuria and urgency.  Skin:  Negative for rash.  Allergic/Immunologic: Negative for immunocompromised state.   Neurological:  Negative for dizziness and headaches.     Objective:   BP (!) 136/94   Pulse 99   Temp 98.3 F (36.8 C) (Oral)   Resp 16   Wt 181 lb 6.4 oz (82.3 kg)   SpO2 95%   BMI 28.41 kg/m  Nursing note and vital signs reviewed.  Physical Exam Constitutional:      General: He is not in acute distress.    Appearance: He is well-developed.  Eyes:     Conjunctiva/sclera: Conjunctivae normal.  Cardiovascular:     Rate and Rhythm: Normal rate and regular rhythm.     Heart sounds: Normal heart sounds. No murmur heard.    No friction rub. No gallop.  Pulmonary:     Effort: Pulmonary effort is normal. No respiratory distress.     Breath sounds: Normal breath sounds. No wheezing or rales.  Chest:     Chest wall: No tenderness.  Abdominal:     General: Bowel sounds are normal.     Palpations: Abdomen is soft.     Tenderness: There is no abdominal tenderness.  Musculoskeletal:     Cervical back: Neck supple.  Lymphadenopathy:     Cervical: No cervical adenopathy.  Skin:    General: Skin is warm and dry.     Findings: No rash.  Neurological:     Mental Status: He is alert and oriented to person, place, and time.          01/12/2024    3:10 PM 09/29/2023    4:31 PM 03/07/2023    3:15 PM 11/22/2022    3:36 PM 08/25/2022    3:29 PM  Depression screen PHQ 2/9  Decreased Interest 0 1 0 0 0  Down, Depressed, Hopeless 0 0 0 0 0  PHQ - 2 Score 0 1 0 0 0  Altered sleeping 0 0     Tired, decreased energy 0 2     Change in appetite 0 0     Feeling bad or failure about yourself  0 0     Trouble concentrating 0 0     Moving slowly or fidgety/restless 0 0     Suicidal thoughts 0 0     PHQ-9 Score 0  3      Difficult doing work/chores  Not difficult at all        Data saved with a previous flowsheet row definition        01/12/2024    3:11 PM 09/29/2023    4:32 PM  GAD 7 : Generalized Anxiety Score  Nervous, Anxious, on Edge 0 1  Control/stop worrying 0 2  Worry too  much - different things 0 2  Trouble relaxing 0 1  Restless 0 0  Easily annoyed or irritable 0 0  Afraid - awful might happen 0 0  Total GAD 7 Score 0 6  Anxiety Difficulty Not difficult at all      The 10-year ASCVD risk score (Arnett DK, et al., 2019) is: 9.6%   Values used to calculate the score:     Age: 78 years     Clincally relevant sex: Male     Is Non-Hispanic African American: No     Diabetic: Yes     Tobacco smoker: No     Systolic Blood Pressure: 136 mmHg     Is BP treated: Yes     HDL Cholesterol: 49 mg/dL     Total Cholesterol: 144 mg/dL      Assessment & Plan:    Patient Active Problem List   Diagnosis Date Noted   Abdominal pain 05/14/2022   At risk for colon cancer 05/13/2022   Syphilis 11/18/2020   Early syphilis, latent 06/13/2019   Type 2 diabetes mellitus with hyperglycemia, without long-term current use of insulin  (HCC) 06/02/2018   Recurrent major depressive disorder, in full remission 06/02/2018   Essential hypertension 06/02/2018   Healthcare maintenance 06/02/2018   Nausea 02/27/2018   Elevated liver enzymes 09/27/2017   Depression 08/30/2017   DOE (dyspnea on exertion) 08/30/2017   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 Encouraged to monitor blood  pressure at home and will consider starting losartan if blood pressure continues to elevate. Will continue current dose hydrochlorothiazide .       Relevant Medications   hydrochlorothiazide  (HYDRODIURIL ) 25 MG tablet   Pitavastatin  Magnesium  4 MG TABS     Endocrine   Type 2 diabetes mellitus with hyperglycemia, without long-term current use of insulin  (HCC)   Hemoglobin A1c improved to 9 and has stopped the mealtime insulin  and Lantus   down to 24 units nightly with blood sugars continuing to improve.  Discussed plan of care to decrease Lantus  1 to 2 units every 2 to 3 days as blood sugars continue to improve. Continue current dose of Farxiga  and Semaglutide . Goal will be to continue to wean Lantus  until ideally able to discontinue.       Relevant Medications   dapagliflozin  propanediol (FARXIGA ) 10 MG TABS tablet   insulin  glargine (LANTUS  SOLOSTAR) 100 UNIT/ML Solostar Pen   Pitavastatin  Magnesium  4 MG TABS   Semaglutide , 1 MG/DOSE, 4 MG/3ML SOPN   glucose blood (TRUE METRIX BLOOD GLUCOSE TEST) test strip     Other   Human immunodeficiency virus (HIV) disease (HCC)   Mr. Harps continues to have well-controlled virus with good adherence and tolerance to Biktarvy .  Reviewed lab work and discussed plan of care and U equals U.  No problems obtaining medication from the pharmacy and covered by [employer] which is up-to-date.  Social determinants of health reviewed with no interventions indicated.  Continue current dose of Biktarvy .  Plan for follow-up in 3 months or sooner if needed with lab work 1 to 2 weeks prior to appointment.      Relevant Medications   bictegravir-emtricitabine-tenofovir AF (BIKTARVY ) 50-200-25 MG TABS tablet   Recurrent major depressive disorder, in full remission   Mood stable with current dose of escitalopram  with no adverse side effects. No suicidal ideations or signs of psychosis. Does have increased levels of anxiety and discussed possibility of counseling for CBT to help with coping mechanisms. Continue current dose of escitalopram .       Relevant Medications   escitalopram  (LEXAPRO ) 20 MG tablet   Syphilis - Primary   RPR titer steadily increasing with concern for possible new infection. Discussed recommendation to treat with 100 mg doxycycline  BID for 14 days and will plan to recheck RPR in about 3 months for effectiveness of treatment.       Relevant Medications    bictegravir-emtricitabine-tenofovir AF (BIKTARVY ) 50-200-25 MG TABS tablet   Other Visit Diagnoses       Gastroesophageal reflux disease without esophagitis       Relevant Medications   omeprazole  (PRILOSEC) 20 MG capsule   ondansetron  (ZOFRAN -ODT) 4 MG disintegrating tablet        I have discontinued Marcello Mclinden's naproxen , prochlorperazine , Omnipod 5 DexG7G6 Intro Gen 5, Omnipod 5 DexG7G6 Pods Gen 5, amoxicillin -clavulanate, ondansetron , and Insulin  Lispro Junior KwikPen. I have also changed his Lantus  SoloStar and Semaglutide  (1 MG/DOSE). Additionally, I am having him start on ondansetron  and doxycycline . Lastly, I am having him maintain his acetaminophen , True Metrix Meter, bacitracin , TRUEplus 5-Bevel Pen Needles, TRUEplus Lancets 28G, Biktarvy , dapagliflozin  propanediol, escitalopram , hydrochlorothiazide , omeprazole , Pitavastatin  Magnesium , and True Metrix Blood Glucose Test.   Meds ordered this encounter  Medications   bictegravir-emtricitabine-tenofovir AF (BIKTARVY ) 50-200-25 MG TABS tablet    Sig: Take 1 tablet by mouth daily.    Dispense:  30 tablet    Refill:  5    Supervising Provider:   SNIDER, CYNTHIA [[id number]]  Prescription Type::   Renewal   dapagliflozin  propanediol (FARXIGA ) 10 MG TABS tablet    Sig: Take 1 tablet (10 mg total) by mouth daily before breakfast.    Dispense:  30 tablet    Refill:  5    Supervising Provider:   SNIDER, CYNTHIA [[id number]]   escitalopram  (LEXAPRO ) 20 MG tablet    Sig: Take 1 tablet (20 mg total) by mouth daily.    Dispense:  30 tablet    Refill:  5    Supervising Provider:   SNIDER, CYNTHIA [[id number]]   hydrochlorothiazide  (HYDRODIURIL ) 25 MG tablet    Sig: Take 1 tablet (25 mg total) by mouth daily.    Dispense:  30 tablet    Refill:  5    Supervising Provider:   SNIDER, CYNTHIA [[id number]]   insulin  glargine (LANTUS  SOLOSTAR) 100 UNIT/ML Solostar Pen    Sig: Inject 24 Units into the skin daily.    Dispense:  30 mL    Refill:  1     Supervising Provider:   SNIDER, CYNTHIA [[id number]]   omeprazole  (PRILOSEC) 20 MG capsule    Sig: Take 1 capsule (20 mg total) by mouth daily.    Dispense:  30 capsule    Refill:  5    Supervising Provider:   SNIDER, CYNTHIA [[id number]]   ondansetron  (ZOFRAN -ODT) 4 MG disintegrating tablet    Sig: Take 1 tablet (4 mg total) by mouth every 8 (eight) hours as needed for nausea or vomiting.    Dispense:  90 tablet    Refill:  5    Supervising Provider:   SNIDER, CYNTHIA [[id number]]   Pitavastatin  Magnesium  4 MG TABS    Sig: Take 1 tablet (4 mg total) by mouth daily.    Dispense:  30 tablet    Refill:  5    Supervising Provider:   SNIDER, CYNTHIA [[id number]]   Semaglutide , 1 MG/DOSE, 4 MG/3ML SOPN    Sig: Inject 2 mg into the skin once a week.    Dispense:  3 mL    Refill:  12    Supervising Provider:   LUIZ, CYNTHIA [[id number]]   glucose blood (TRUE METRIX BLOOD GLUCOSE TEST) test strip    Sig: Use as instructed    Dispense:  100 each    Refill:  12    Substitute permissible for appropriate meter strips that are $10 and match selected meter (True Metrix Meter)    Supervising Provider:   LUIZ CHANNEL [[id number]]   doxycycline  (VIBRA -TABS) 100 MG tablet    Sig: Take 1 tablet (100 mg total) by mouth 2 (two) times daily.    Dispense:  28 tablet    Refill:  0    Supervising Provider:   LUIZ CHANNEL [[id number]]     Follow-up: Return in about 3 months (around 07/08/2024). or sooner if needed.    Cathlyn July, MSN, FNP-C Nurse Practitioner [facility] for Infectious Disease [facility] Main number: [phone]

## 2024-04-09 NOTE — Patient Instructions (Signed)
 Nice to see you.  Continue to take your medication daily as prescribed.  Refills have been sent to the pharmacy.  Plan for follow up in 3 months or sooner if needed with lab work 1-2 weeks prior to appointment.   Have a great day and stay safe!

## 2024-04-10 ENCOUNTER — Encounter: Payer: Self-pay | Admitting: Family

## 2024-04-10 NOTE — Assessment & Plan Note (Signed)
 Hemoglobin A1c improved to 9 and has stopped the mealtime insulin  and Lantus  down to 24 units nightly with blood sugars continuing to improve.  Discussed plan of care to decrease Lantus  1 to 2 units every 2 to 3 days as blood sugars continue to improve. Continue current dose of Farxiga  and Semaglutide . Goal will be to continue to wean Lantus  until ideally able to discontinue.

## 2024-04-10 NOTE — Assessment & Plan Note (Signed)
 Mood stable with current dose of escitalopram  with no adverse side effects. No suicidal ideations or signs of psychosis. Does have increased levels of anxiety and discussed possibility of counseling for CBT to help with coping mechanisms. Continue current dose of escitalopram .

## 2024-04-10 NOTE — Assessment & Plan Note (Signed)
 Blood pressure adequately controlled with current dose of hydrochlorothiazide . Encouraged to monitor blood pressure at home and will consider starting losartan if blood pressure continues to elevate. Will continue current dose hydrochlorothiazide .

## 2024-04-10 NOTE — Assessment & Plan Note (Signed)
 RPR titer steadily increasing with concern for possible new infection. Discussed recommendation to treat with 100 mg doxycycline  BID for 14 days and will plan to recheck RPR in about 3 months for effectiveness of treatment.

## 2024-04-10 NOTE — Assessment & Plan Note (Signed)
 Adam Chan continues to have well-controlled virus with good adherence and tolerance to Biktarvy .  Reviewed lab work and discussed plan of care and U equals U.  No problems obtaining medication from the pharmacy and covered by [employer] which is up-to-date.  Social determinants of health reviewed with no interventions indicated.  Continue current dose of Biktarvy .  Plan for follow-up in 3 months or sooner if needed with lab work 1 to 2 weeks prior to appointment.

## 2024-05-07 ENCOUNTER — Telehealth: Payer: Self-pay

## 2024-05-07 ENCOUNTER — Other Ambulatory Visit (HOSPITAL_COMMUNITY): Payer: Self-pay

## 2024-05-07 NOTE — Telephone Encounter (Signed)
 [facility] Patient Advocate Encounter   Completed and sent [employer]  PATIENT ASSISTANCE  application for Ozempic  for this patient who is uninsured.     Patient assistance  phone number for follow up is [phone].    This encounter will be updated until final determination.    Charmaine Sharps, CPhT Specialty Pharmacy Patient [facility] Infectious Disease Phone: [phone] Fax:  [phone]

## 2024-05-15 ENCOUNTER — Encounter (INDEPENDENT_AMBULATORY_CARE_PROVIDER_SITE_OTHER): Payer: Self-pay

## 2024-05-15 DIAGNOSIS — F4322 Adjustment disorder with anxiety: Secondary | ICD-10-CM

## 2024-05-15 DIAGNOSIS — E1165 Type 2 diabetes mellitus with hyperglycemia: Secondary | ICD-10-CM

## 2024-05-15 DIAGNOSIS — R0609 Other forms of dyspnea: Secondary | ICD-10-CM

## 2024-05-28 ENCOUNTER — Telehealth: Payer: Self-pay | Admitting: Physician Assistant

## 2024-05-28 NOTE — Telephone Encounter (Signed)
 Patient wants a call back to confirm his Medicare information will be available for his visit on 1/20.

## 2024-05-29 ENCOUNTER — Ambulatory Visit: Payer: Self-pay | Admitting: Physician Assistant

## 2024-05-29 NOTE — Telephone Encounter (Signed)
 I attempted to call the patient at 7 am to obtain his insurance information. He was not available. Asked him to [facility] w/that information.

## 2024-05-30 ENCOUNTER — Ambulatory Visit: Payer: Self-pay

## 2024-05-30 ENCOUNTER — Other Ambulatory Visit: Payer: Self-pay

## 2024-05-30 ENCOUNTER — Emergency Department (HOSPITAL_COMMUNITY)
Admission: EM | Admit: 2024-05-30 | Discharge: 2024-05-31 | Disposition: A | Discharge status: Home / Self Care | Payer: Self-pay

## 2024-05-30 ENCOUNTER — Emergency Department (HOSPITAL_COMMUNITY): Payer: Self-pay

## 2024-05-30 DIAGNOSIS — F4322 Adjustment disorder with anxiety: Secondary | ICD-10-CM

## 2024-05-30 DIAGNOSIS — R0609 Other forms of dyspnea: Secondary | ICD-10-CM | POA: Insufficient documentation

## 2024-05-30 DIAGNOSIS — R0789 Other chest pain: Secondary | ICD-10-CM | POA: Insufficient documentation

## 2024-05-30 DIAGNOSIS — G629 Polyneuropathy, unspecified: Secondary | ICD-10-CM | POA: Insufficient documentation

## 2024-05-30 DIAGNOSIS — R112 Nausea with vomiting, unspecified: Secondary | ICD-10-CM | POA: Insufficient documentation

## 2024-05-30 LAB — COMPREHENSIVE METABOLIC PANEL WITH GFR
ALT: 174 U/L — ABNORMAL HIGH (ref 0–44)
AST: 141 U/L — ABNORMAL HIGH (ref 15–41)
Albumin: 4.7 g/dL (ref 3.5–5.0)
Alkaline Phosphatase: 99 U/L (ref 38–126)
Anion gap: 24 — ABNORMAL HIGH (ref 5–15)
BUN: 16 mg/dL (ref 6–20)
CO2: 20 mmol/L — ABNORMAL LOW (ref 22–32)
Calcium: 10.4 mg/dL — ABNORMAL HIGH (ref 8.9–10.3)
Chloride: 92 mmol/L — ABNORMAL LOW (ref 98–111)
Creatinine, Ser: 0.92 mg/dL (ref 0.61–1.24)
GFR, Estimated: >60 mL/min
Glucose, Bld: 276 mg/dL — ABNORMAL HIGH (ref 70–99)
Potassium: 3.9 mmol/L (ref 3.5–5.1)
Sodium: 136 mmol/L (ref 135–145)
Total Bilirubin: 0.9 mg/dL (ref 0.0–1.2)
Total Protein: 8.6 g/dL — ABNORMAL HIGH (ref 6.5–8.1)

## 2024-05-30 LAB — CBC WITH DIFFERENTIAL/PLATELET
Abs Immature Granulocytes: 0.02 K/uL (ref 0.00–0.07)
Basophils Absolute: 0 K/uL (ref 0.0–0.1)
Basophils Relative: 0 %
Eosinophils Absolute: 0 K/uL (ref 0.0–0.5)
Eosinophils Relative: 0 %
HCT: 51.2 % (ref 39.0–52.0)
Hemoglobin: 16.8 g/dL (ref 13.0–17.0)
Immature Granulocytes: 0 %
Lymphocytes Relative: 47 %
Lymphs Abs: 2.7 K/uL (ref 0.7–4.0)
MCH: 27.5 pg (ref 26.0–34.0)
MCHC: 32.8 g/dL (ref 30.0–36.0)
MCV: 83.8 fL (ref 80.0–100.0)
Monocytes Absolute: 0.5 K/uL (ref 0.1–1.0)
Monocytes Relative: 8 %
Neutro Abs: 2.6 K/uL (ref 1.7–7.7)
Neutrophils Relative %: 45 %
Platelets: 289 K/uL (ref 150–400)
RBC: 6.11 MIL/uL — ABNORMAL HIGH (ref 4.22–5.81)
RDW: 17.3 % — ABNORMAL HIGH (ref 11.5–15.5)
WBC: 5.8 K/uL (ref 4.0–10.5)
nRBC: 0 % (ref 0.0–0.2)

## 2024-05-30 LAB — BLOOD GAS, VENOUS
Acid-Base Excess: 0.4 mmol/L (ref 0.0–2.0)
Bicarbonate: 22.9 mmol/L (ref 20.0–28.0)
O2 Saturation: 63.2 %
Patient temperature: 37
pCO2, Ven: 30 mmHg — ABNORMAL LOW (ref 44–60)
pH, Ven: 7.49 — ABNORMAL HIGH (ref 7.25–7.43)
pO2, Ven: 41 mmHg (ref 32–45)

## 2024-05-30 LAB — CBG MONITORING, ED: Glucose-Capillary: 289 mg/dL — ABNORMAL HIGH (ref 70–99)

## 2024-05-30 LAB — TROPONIN T, HIGH SENSITIVITY
Troponin T High Sensitivity: 13 ng/L (ref 0–19)
Troponin T High Sensitivity: 12 ng/L (ref 0–19)

## 2024-05-30 MED ORDER — HYDROXYZINE HCL 25 MG PO TABS: 25.0000 mg | ORAL_TABLET | Freq: Three times a day (TID) | ORAL | 1 refills | Status: AC | PRN

## 2024-05-30 NOTE — ED Provider Notes (Signed)
 "  EMERGENCY DEPARTMENT AT [facility] Provider Note   CSN: [id number] Arrival date & time: 05/30/24  8296     Patient presents with: No chief complaint on file.   Adam Chan is a 54 y.o. male.  {Add pertinent medical, surgical, social history, OB history to YEP:67052} Patient to ED for evaluation of symptoms of shortness of breath and chest tightness that started over 6 months ago. Symptoms seem to be related to exertion. No cough. He reports nausea with vomiting a yellow substance for the same period of time. 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 He reports feeling generally fatigued and weak chronically. No fever. He is currently being referred for specialty evaluation by his doctor Lynn) including cardiology, and endocrinology.  The history is provided by the patient. No language interpreter was used.       Prior to Admission medications  Medication Sig Start Date End Date Taking? Authorizing Provider  acetaminophen  (TYLENOL ) 325 MG tablet Take 2 tablets (650 mg total) by mouth every 6 (six) hours as needed. 06/11/22   Elnor Jayson LABOR, DO  bacitracin  ointment Apply 1 Application topically 2 (two) times daily. 03/24/23   Robinson, John K, PA-C  bictegravir-emtricitabine-tenofovir AF (BIKTARVY ) 50-200-25 MG TABS tablet Take 1 tablet by mouth daily. 04/09/24   Calone, Gregory D, FNP  Blood Glucose Monitoring Suppl (TRUE METRIX METER) w/Device KIT Use to check blood sugars each morning and as directed. 08/12/22   Calone, Gregory D, FNP  dapagliflozin  propanediol (FARXIGA ) 10 MG TABS tablet Take 1 tablet (10 mg total) by mouth daily before breakfast. 04/09/24   Calone, Gregory D, FNP  doxycycline  (VIBRA -TABS) 100 MG tablet Take 1 tablet (100 mg total) by mouth 2 (two) times daily. 04/09/24   Calone, Gregory D, FNP  escitalopram  (LEXAPRO ) 20 MG tablet Take 1 tablet (20 mg total) by  mouth daily. 04/09/24   Calone, Gregory D, FNP  glucose blood (TRUE METRIX BLOOD GLUCOSE TEST) test strip Use as instructed 04/09/24   Calone, Gregory D, FNP  hydrochlorothiazide  (HYDRODIURIL ) 25 MG tablet Take 1 tablet (25 mg total) by mouth daily. 04/09/24   Calone, Gregory D, FNP  hydrOXYzine  (ATARAX ) 25 MG tablet Take 1-2 tablets (25-50 mg total) by mouth every 8 (eight) hours as needed for anxiety. 05/30/24   Calone, Gregory D, FNP  insulin  glargine (LANTUS  SOLOSTAR) 100 UNIT/ML Solostar Pen Inject 24 Units into the skin daily. 04/09/24   Calone, Gregory D, FNP  omeprazole  (PRILOSEC) 20 MG capsule Take 1 capsule (20 mg total) by mouth daily. 04/09/24   Calone, Gregory D, FNP  ondansetron  (ZOFRAN -ODT) 4 MG disintegrating tablet Take 1 tablet (4 mg total) by mouth every 8 (eight) hours as needed for nausea or vomiting. 04/09/24   Calone, Gregory D, FNP  Pitavastatin  Magnesium  4 MG TABS Take 1 tablet (4 mg total) by mouth daily. 04/09/24   Calone, Gregory D, FNP  Semaglutide , 1 MG/DOSE, 4 MG/3ML SOPN Inject 2 mg into the skin once a week. 04/09/24   Calone, Gregory D, FNP  TRUEPLUS 5-BEVEL PEN NEEDLES 32G X 4 MM MISC USE 1 NEEDLE PER INJECTION OF LANTUS  DAILY. DO NOT RE-USE NEEDLES 12/26/23   Calone, Gregory D, FNP  TRUEplus Lancets 28G MISC Use as instructed 12/26/23   Calone, Gregory D, FNP    Allergies: Elemental sulfur and Sulfa antibiotics    Review of Systems  Updated Vital Signs BP (!) 141/99 (BP Location: Right Arm)   Pulse (!) 101   Temp 98.2 F (36.8 C) (Oral)   Resp 17   SpO2 100%   Physical Exam Vitals and nursing note reviewed.  Constitutional:      Appearance: Normal appearance. He is well-developed. He is not ill-appearing.  HENT:     Head: Normocephalic.  Cardiovascular:     Rate and Rhythm: Normal rate and regular rhythm.     Heart sounds: No murmur heard. Pulmonary:     Effort: Pulmonary effort is normal.     Breath sounds: Normal breath sounds. No wheezing, rhonchi or  rales.  Chest:     Chest wall: No tenderness.  Abdominal:     Palpations: Abdomen is soft.     Tenderness: There is no abdominal tenderness. There is no guarding or rebound.  Musculoskeletal:        General: Normal range of motion.     Cervical back: Normal range of motion and neck supple.     Right lower leg: No edema.     Left lower leg: No edema.     Comments: Feet are tender bilaterally without swelling or discoloration. Pulses intact, feet are warm and perfused.  Skin:    General: Skin is warm and dry.  Neurological:     General: No focal deficit present.     Mental Status: He is alert and oriented to person, place, and time.     (all labs ordered are listed, but only abnormal results are displayed) Labs Reviewed  COMPREHENSIVE METABOLIC PANEL WITH GFR - Abnormal; Notable for the following components:      Result Value   Chloride 92 (*)    CO2 20 (*)    Glucose, Bld 276 (*)    Calcium  10.4 (*)    Total Protein 8.6 (*)    AST 141 (*)    ALT 174 (*)    Anion gap 24 (*)    All other components within normal limits  CBC WITH DIFFERENTIAL/PLATELET - Abnormal; Notable for the following components:   RBC 6.11 (*)    RDW 17.3 (*)    All other components within normal limits  CBG MONITORING, ED - Abnormal; Notable for the following components:   Glucose-Capillary 289 (*)    All other components within normal limits  TROPONIN T, HIGH SENSITIVITY  TROPONIN T, HIGH SENSITIVITY    EKG: None  Radiology: DG Chest 2 View Result Date: 05/30/2024 CLINICAL DATA:  Chest pain. EXAM: CHEST - 2 VIEW COMPARISON:  June 11, 2022 FINDINGS: The heart size and mediastinal contours are within normal limits. Both lungs are clear. Radiopaque surgical clips are seen within the right upper quadrant. The visualized skeletal structures are unremarkable. IMPRESSION: No active cardiopulmonary disease. Electronically Signed   By: Suzen Dials M.D.   On: 05/30/2024 18:36    {Document cardiac  monitor, telemetry assessment procedure when appropriate:32947} Procedures   Medications Ordered in the ED - No data to display    {Click here for ABCD2, HEART and other calculators REFRESH Note before signing:1}                              Medical Decision Making Amount and/or Complexity of Data Reviewed Labs: ordered.   ***  {Document critical care time when appropriate  Document review of labs and clinical decision tools ie CHADS2VASC2, etc  Document your independent review  of radiology images and any outside records  Document your discussion with family members, caretakers and with consultants  Document social determinants of health affecting pt's care  Document your decision making why or why not admission, treatments were needed:32947:::1}   Final diagnoses:  None    ED Discharge Orders     None        "

## 2024-05-30 NOTE — Addendum Note (Signed)
 Addended by: Morganna Styles D on: 05/30/2024 04:20 PM   Modules accepted: Orders

## 2024-05-30 NOTE — ED Triage Notes (Signed)
 Patient BIB EMS for near syncopal episode. Called EMS for shortness of breath and weakness, denies chest pain today. States chronic and been getting worse. Gets light headed, and feels like he is going to pass out. Gets SOB and fatigued with activity. Also been thirsty. Patient had DM   146/100 118 22 98% RA CBG 290

## 2024-05-30 NOTE — Telephone Encounter (Signed)
 Please see the MyChart message reply(ies) for my assessment and plan.    This patient gave consent for this Medical Advice Message and is aware that it may result in a bill to [employer], as well as the possibility of receiving a bill for a co-payment or deductible. They are an established patient, but are not seeking medical advice exclusively about a problem treated during an in person or video visit in the last seven days. I did not recommend an in person or video visit within seven days of my reply.    I spent a total of 6 minutes cumulative time within 7 days through [employer].  Ajdin Macke, FNP

## 2024-05-30 NOTE — Discharge Instructions (Addendum)
 As we discussed your labs, EKG and xray are all reassuring. You can be discharged home and are encouraged to follow up with your doctor and continue to pursue the referrals being made to specialty care.   Return to the ED with any new or concerning symptoms at any time.

## 2024-05-30 NOTE — ED Provider Triage Note (Signed)
 Emergency Medicine Provider Triage Evaluation Note  Adam Chan , a 54 y.o. male  was evaluated in triage.  Pt complains of chest tightness, leg numbness.  Pt thinsk he has neuropathy from his diabetes  Review of Systems  Positive: Chest discomfort Negative: fever  Physical Exam  BP (!) 143/113 (BP Location: Left Arm)   Pulse (!) 120   Temp 98.3 F (36.8 C) (Oral)   Resp 16   SpO2 99%  Gen:   Awake, no distress   Resp:  Normal effort  MSK:   Moves extremities without difficulty  Other:    Medical Decision Making  Medically screening exam initiated at 6:07 PM.  Appropriate orders placed.  Saurabh Hettich was informed that the remainder of the evaluation will be completed by another provider, this initial triage assessment does not replace that evaluation, and the importance of remaining in the ED until their evaluation is complete.     Adam Sonny POUR, PA-C 05/30/24 1952

## 2024-05-31 ENCOUNTER — Other Ambulatory Visit (HOSPITAL_COMMUNITY): Payer: Self-pay

## 2024-05-31 LAB — BETA-HYDROXYBUTYRIC ACID: Beta-Hydroxybutyric Acid: 5.9 mmol/L — ABNORMAL HIGH (ref 0.05–0.27)

## 2024-05-31 NOTE — ED Provider Notes (Incomplete)
 " [place] EMERGENCY DEPARTMENT AT [facility] Provider Note   CSN: [id number] Arrival date & time: 05/30/24  8296     Patient presents with: No chief complaint on file.   Adam Chan is a 54 y.o. male.  {Add pertinent medical, surgical, social history, OB history to [id number]:67052} Patient to ED for evaluation of symptoms of shortness of breath and chest tightness that started over 6 months ago. Symptoms seem to be related to exertion. No cough. He reports nausea with vomiting a yellow substance for the same period of time. 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 He reports feeling generally fatigued and weak chronically. No fever. He is currently being referred for specialty evaluation by his doctor Lynn) including cardiology, and endocrinology.  The history is provided by the patient. No language interpreter was used.       Prior to Admission medications  Medication Sig Start Date End Date Taking? Authorizing Provider  acetaminophen  (TYLENOL ) 325 MG tablet Take 2 tablets (650 mg total) by mouth every 6 (six) hours as needed. 06/11/22   Elnor Jayson LABOR, DO  bacitracin  ointment Apply 1 Application topically 2 (two) times daily. 03/24/23   Robinson, John K, PA-C  bictegravir-emtricitabine-tenofovir AF (BIKTARVY ) 50-200-25 MG TABS tablet Take 1 tablet by mouth daily. 04/09/24   Calone, Gregory D, FNP  Blood Glucose Monitoring Suppl (TRUE METRIX METER) w/Device KIT Use to check blood sugars each morning and as directed. 08/12/22   Calone, Gregory D, FNP  dapagliflozin  propanediol (FARXIGA ) 10 MG TABS tablet Take 1 tablet (10 mg total) by mouth daily before breakfast. 04/09/24   Calone, Gregory D, FNP  doxycycline  (VIBRA -TABS) 100 MG tablet Take 1 tablet (100 mg total) by mouth 2 (two) times daily. 04/09/24   Calone, Gregory D, FNP  escitalopram  (LEXAPRO ) 20 MG tablet Take 1 tablet (20 mg total) by  mouth daily. 04/09/24   Calone, Gregory D, FNP  glucose blood (TRUE METRIX BLOOD GLUCOSE TEST) test strip Use as instructed 04/09/24   Calone, Gregory D, FNP  hydrochlorothiazide  (HYDRODIURIL ) 25 MG tablet Take 1 tablet (25 mg total) by mouth daily. 04/09/24   Calone, Gregory D, FNP  hydrOXYzine  (ATARAX ) 25 MG tablet Take 1-2 tablets (25-50 mg total) by mouth every 8 (eight) hours as needed for anxiety. 05/30/24   Calone, Gregory D, FNP  insulin  glargine (LANTUS  SOLOSTAR) 100 UNIT/ML Solostar Pen Inject 24 Units into the skin daily. 04/09/24   Calone, Gregory D, FNP  omeprazole  (PRILOSEC) 20 MG capsule Take 1 capsule (20 mg total) by mouth daily. 04/09/24   Calone, Gregory D, FNP  ondansetron  (ZOFRAN -ODT) 4 MG disintegrating tablet Take 1 tablet (4 mg total) by mouth every 8 (eight) hours as needed for nausea or vomiting. 04/09/24   Calone, Gregory D, FNP  Pitavastatin  Magnesium  4 MG TABS Take 1 tablet (4 mg total) by mouth daily. 04/09/24   Calone, Gregory D, FNP  Semaglutide , 1 MG/DOSE, 4 MG/3ML SOPN Inject 2 mg into the skin once a week. 04/09/24   Calone, Gregory D, FNP  TRUEPLUS 5-BEVEL PEN NEEDLES 32G X 4 MM MISC USE 1 NEEDLE PER INJECTION OF LANTUS  DAILY. DO NOT RE-USE NEEDLES 12/26/23   Calone, Gregory D, FNP  TRUEplus Lancets 28G MISC Use as instructed 12/26/23   Calone, Gregory D, FNP    Allergies: Elemental sulfur and Sulfa antibiotics    Review of Systems  Updated Vital Signs BP (!) 141/99 (BP Location: Right Arm)   Pulse (!) 101   Temp 98.2 F (36.8 C) (Oral)   Resp 17   SpO2 100%   Physical Exam Vitals and nursing note reviewed.  Constitutional:      Appearance: Normal appearance. He is well-developed. He is not ill-appearing.  HENT:     Head: Normocephalic.  Cardiovascular:     Rate and Rhythm: Normal rate and regular rhythm.     Heart sounds: No murmur heard. Pulmonary:     Effort: Pulmonary effort is normal.     Breath sounds: Normal breath sounds. No wheezing, rhonchi or  rales.  Chest:     Chest wall: No tenderness.  Abdominal:     Palpations: Abdomen is soft.     Tenderness: There is no abdominal tenderness. There is no guarding or rebound.  Musculoskeletal:        General: Normal range of motion.     Cervical back: Normal range of motion and neck supple.     Right lower leg: No edema.     Left lower leg: No edema.     Comments: Feet are tender bilaterally without swelling or discoloration. Pulses intact, feet are warm and perfused.  Skin:    General: Skin is warm and dry.  Neurological:     General: No focal deficit present.     Mental Status: He is alert and oriented to person, place, and time.     (all labs ordered are listed, but only abnormal results are displayed) Labs Reviewed  COMPREHENSIVE METABOLIC PANEL WITH GFR - Abnormal; Notable for the following components:      Result Value   Chloride 92 (*)    CO2 20 (*)    Glucose, Bld 276 (*)    Calcium  10.4 (*)    Total Protein 8.6 (*)    AST 141 (*)    ALT 174 (*)    Anion gap 24 (*)    All other components within normal limits  CBC WITH DIFFERENTIAL/PLATELET - Abnormal; Notable for the following components:   RBC 6.11 (*)    RDW 17.3 (*)    All other components within normal limits  CBG MONITORING, ED - Abnormal; Notable for the following components:   Glucose-Capillary 289 (*)    All other components within normal limits  TROPONIN T, HIGH SENSITIVITY  TROPONIN T, HIGH SENSITIVITY    EKG: None  Radiology: DG Chest 2 View Result Date: 05/30/2024 CLINICAL DATA:  Chest pain. EXAM: CHEST - 2 VIEW COMPARISON:  June 11, 2022 FINDINGS: The heart size and mediastinal contours are within normal limits. Both lungs are clear. Radiopaque surgical clips are seen within the right upper quadrant. The visualized skeletal structures are unremarkable. IMPRESSION: No active cardiopulmonary disease. Electronically Signed   By: Suzen Dials M.D.   On: 05/30/2024 18:36    {Document cardiac  monitor, telemetry assessment procedure when appropriate:32947} Procedures   Medications Ordered in the ED - No data to display    {Click here for ABCD2, HEART and other calculators REFRESH Note before signing:1}                              Medical Decision Making Amount and/or Complexity of Data Reviewed Labs: ordered.   ***  {Document critical care time when appropriate  Document review of labs and clinical decision tools ie CHADS2VASC2, etc  Document your independent review  of radiology images and any outside records  Document your discussion with family members, caretakers and with consultants  Document social determinants of health affecting pt's care  Document your decision making why or why not admission, treatments were needed:32947:::1}   Final diagnoses:  None    ED Discharge Orders     None        "

## 2024-06-01 ENCOUNTER — Other Ambulatory Visit (HOSPITAL_COMMUNITY): Payer: Self-pay

## 2024-06-06 ENCOUNTER — Ambulatory Visit: Payer: Self-pay | Admitting: Licensed Clinical Social Worker

## 2024-06-12 ENCOUNTER — Other Ambulatory Visit (HOSPITAL_COMMUNITY): Payer: Self-pay

## 2024-06-13 ENCOUNTER — Ambulatory Visit: Payer: Self-pay | Admitting: Licensed Clinical Social Worker

## 2024-06-13 ENCOUNTER — Other Ambulatory Visit: Payer: Self-pay

## 2024-06-13 DIAGNOSIS — F330 Major depressive disorder, recurrent, mild: Secondary | ICD-10-CM
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
## 2024-06-20 ENCOUNTER — Ambulatory Visit: Payer: Self-pay | Admitting: Licensed Clinical Social Worker

## 2024-07-10 ENCOUNTER — Other Ambulatory Visit: Payer: Self-pay

## 2024-07-24 ENCOUNTER — Ambulatory Visit: Payer: Self-pay | Admitting: Family

## 2024-10-11 ENCOUNTER — Ambulatory Visit: Payer: Self-pay | Admitting: Endocrinology
